# Patient Record
Sex: Male | Born: 1950
Health system: Southern US, Community
[De-identification: ages and names within clinical notes are randomized; demographics above are authoritative.]

## PROBLEM LIST (undated history)

## (undated) DIAGNOSIS — I10 Essential (primary) hypertension: Secondary | ICD-10-CM

## (undated) DIAGNOSIS — R42 Dizziness and giddiness: Secondary | ICD-10-CM

## (undated) DIAGNOSIS — M1990 Unspecified osteoarthritis, unspecified site: Secondary | ICD-10-CM

## (undated) DIAGNOSIS — N200 Calculus of kidney: Secondary | ICD-10-CM

## (undated) DIAGNOSIS — I2699 Other pulmonary embolism without acute cor pulmonale: Secondary | ICD-10-CM

## (undated) DIAGNOSIS — J449 Chronic obstructive pulmonary disease, unspecified: Secondary | ICD-10-CM

## (undated) DIAGNOSIS — IMO0001 Reserved for inherently not codable concepts without codable children: Secondary | ICD-10-CM

## (undated) HISTORY — DX: Other pulmonary embolism without acute cor pulmonale: I26.99

## (undated) HISTORY — PX: COLONOSCOPY: SHX174

## (undated) HISTORY — PX: CARDIAC CATHETERIZATION: SHX172

## (undated) HISTORY — PX: OTHER SURGICAL HISTORY: SHX169

---

## 1998-01-13 ENCOUNTER — Ambulatory Visit (HOSPITAL_COMMUNITY): Admission: RE | Admit: 1998-01-13 | Discharge: 1998-01-13 | Payer: Self-pay | Admitting: *Deleted

## 1999-05-06 ENCOUNTER — Ambulatory Visit (HOSPITAL_COMMUNITY): Admission: RE | Admit: 1999-05-06 | Discharge: 1999-05-06 | Payer: Self-pay | Admitting: *Deleted

## 2000-09-22 ENCOUNTER — Emergency Department (HOSPITAL_COMMUNITY): Admission: EM | Admit: 2000-09-22 | Discharge: 2000-09-22 | Payer: Self-pay

## 2001-02-04 ENCOUNTER — Encounter: Payer: Self-pay | Admitting: Internal Medicine

## 2001-02-04 ENCOUNTER — Encounter: Admission: RE | Admit: 2001-02-04 | Discharge: 2001-02-04 | Payer: Self-pay | Admitting: Internal Medicine

## 2004-01-08 ENCOUNTER — Encounter (INDEPENDENT_AMBULATORY_CARE_PROVIDER_SITE_OTHER): Payer: Self-pay | Admitting: Specialist

## 2004-01-08 ENCOUNTER — Ambulatory Visit (HOSPITAL_COMMUNITY): Admission: RE | Admit: 2004-01-08 | Discharge: 2004-01-08 | Payer: Self-pay | Admitting: Gastroenterology

## 2008-01-17 ENCOUNTER — Encounter: Admission: RE | Admit: 2008-01-17 | Discharge: 2008-01-17 | Payer: Self-pay | Admitting: Internal Medicine

## 2008-03-20 ENCOUNTER — Encounter: Admission: RE | Admit: 2008-03-20 | Discharge: 2008-03-20 | Payer: Self-pay | Admitting: Internal Medicine

## 2008-04-03 ENCOUNTER — Encounter: Admission: RE | Admit: 2008-04-03 | Discharge: 2008-04-03 | Payer: Self-pay | Admitting: Orthopedic Surgery

## 2008-04-08 ENCOUNTER — Encounter: Admission: RE | Admit: 2008-04-08 | Discharge: 2008-04-08 | Payer: Self-pay | Admitting: Orthopedic Surgery

## 2008-04-22 ENCOUNTER — Encounter: Admission: RE | Admit: 2008-04-22 | Discharge: 2008-04-22 | Payer: Self-pay | Admitting: Orthopedic Surgery

## 2008-07-19 ENCOUNTER — Encounter: Admission: RE | Admit: 2008-07-19 | Discharge: 2008-07-19 | Payer: Self-pay | Admitting: Orthopedic Surgery

## 2008-10-15 ENCOUNTER — Encounter: Admission: RE | Admit: 2008-10-15 | Discharge: 2008-10-15 | Payer: Self-pay | Admitting: Internal Medicine

## 2008-12-26 ENCOUNTER — Encounter: Admission: RE | Admit: 2008-12-26 | Discharge: 2008-12-26 | Payer: Self-pay | Admitting: Internal Medicine

## 2009-02-04 ENCOUNTER — Encounter: Admission: RE | Admit: 2009-02-04 | Discharge: 2009-02-04 | Payer: Self-pay | Admitting: Orthopedic Surgery

## 2009-02-04 ENCOUNTER — Emergency Department (HOSPITAL_COMMUNITY): Admission: EM | Admit: 2009-02-04 | Discharge: 2009-02-05 | Payer: Self-pay | Admitting: Emergency Medicine

## 2009-02-10 ENCOUNTER — Ambulatory Visit (HOSPITAL_BASED_OUTPATIENT_CLINIC_OR_DEPARTMENT_OTHER): Admission: RE | Admit: 2009-02-10 | Discharge: 2009-02-10 | Payer: Self-pay | Admitting: Urology

## 2009-05-23 ENCOUNTER — Ambulatory Visit (HOSPITAL_COMMUNITY): Admission: RE | Admit: 2009-05-23 | Discharge: 2009-05-23 | Payer: Self-pay | Admitting: Cardiology

## 2010-08-21 LAB — URINALYSIS, ROUTINE W REFLEX MICROSCOPIC
Glucose, UA: NEGATIVE mg/dL
Ketones, ur: NEGATIVE mg/dL
Nitrite: NEGATIVE
Protein, ur: NEGATIVE mg/dL
Urobilinogen, UA: 0.2 mg/dL (ref 0.0–1.0)

## 2010-08-21 LAB — POCT I-STAT, CHEM 8
BUN: 27 mg/dL — ABNORMAL HIGH (ref 6–23)
Calcium, Ion: 1.13 mmol/L (ref 1.12–1.32)
Chloride: 102 mEq/L (ref 96–112)
Glucose, Bld: 126 mg/dL — ABNORMAL HIGH (ref 70–99)
Potassium: 4.1 mEq/L (ref 3.5–5.1)

## 2010-08-21 LAB — DIFFERENTIAL
Basophils Absolute: 0 10*3/uL (ref 0.0–0.1)
Basophils Relative: 0 % (ref 0–1)
Eosinophils Absolute: 0 10*3/uL (ref 0.0–0.7)
Eosinophils Relative: 0 % (ref 0–5)
Monocytes Relative: 6 % (ref 3–12)
Neutro Abs: 12.3 10*3/uL — ABNORMAL HIGH (ref 1.7–7.7)

## 2010-08-21 LAB — CBC
HCT: 51.1 % (ref 39.0–52.0)
Hemoglobin: 17.4 g/dL — ABNORMAL HIGH (ref 13.0–17.0)
RBC: 5.26 MIL/uL (ref 4.22–5.81)

## 2010-08-21 LAB — POCT I-STAT 4, (NA,K, GLUC, HGB,HCT)
Glucose, Bld: 97 mg/dL (ref 70–99)
HCT: 53 % — ABNORMAL HIGH (ref 39.0–52.0)

## 2010-10-02 NOTE — Op Note (Signed)
NAME:  Devin Castro, Devin Castro                           ACCOUNT NO.:  [id number]   MEDICAL RECORD NO.:  [id number]                   PATIENT TYPE:  AMB   LOCATION:  ENDO                                 FACILITY:  [facility]   PHYSICIAN:  Bernette Redbird, M.D.                DATE OF BIRTH:  [birth year]   DATE OF PROCEDURE:  01/08/2004  DATE OF DISCHARGE:                                 OPERATIVE REPORT   PROCEDURE:  Colonoscopy with biopsy.   INDICATIONS FOR PROCEDURE:  Screening for colon cancer in a 61 year old  without worrisome risk factors or symptoms.   FINDINGS:  Diminutive sigmoid polyp.  Moderate sigmoid diverticulosis.   PROCEDURE:  The nature, purpose, and risks of the procedure have been  discussed with the patient who provided written consent.  Sedation was  Fentanyl 70 mcg and Versed 7 mg IV without arrhythmias or desaturations.  Digital exam of the prostate was unremarkable.  The Olympus Stealth video  colonoscope was advanced easily to the cecum, using some external abdominal  compression to enter the base of the cecum and pull back was then performed.  The cecum was identified by clear visualization of the appendiceal orifice.  The only abnormality on the exam was a questionable 2-3 mm sessile polyp in  the sigmoid region removed by a single cold biopsy plus moderate sigmoid  diverticulosis.  The quality of the prep was excellent and it was felt that  all areas were well seen.  No large polyps, cancer, colitis, or vascular  malformations were observed.  Retroflexion of the rectum was unremarkable.  The patient tolerated the procedure well and there were no apparent  complications.   IMPRESSION:  Polyp removed as described above.   PLAN:  Await pathology results.                                               Bernette Redbird, M.D.    RB/MEDQ  D:  01/08/2004  T:  01/08/2004  Job:  [id number]   cc:   Olene Craven, M.D.  [address]  [address]  [place]  [place] [address]  Fax: [phone]

## 2011-08-16 ENCOUNTER — Encounter (HOSPITAL_COMMUNITY): Payer: Self-pay | Admitting: *Deleted

## 2011-08-16 ENCOUNTER — Emergency Department (HOSPITAL_COMMUNITY)
Admission: EM | Admit: 2011-08-16 | Discharge: 2011-08-16 | Disposition: A | Discharge status: Home / Self Care | Payer: Medicare Other | Attending: Emergency Medicine | Admitting: Emergency Medicine

## 2011-08-16 DIAGNOSIS — I10 Essential (primary) hypertension: Secondary | ICD-10-CM | POA: Insufficient documentation

## 2011-08-16 DIAGNOSIS — Z79899 Other long term (current) drug therapy: Secondary | ICD-10-CM | POA: Insufficient documentation

## 2011-08-16 DIAGNOSIS — J4489 Other specified chronic obstructive pulmonary disease: Secondary | ICD-10-CM | POA: Insufficient documentation

## 2011-08-16 DIAGNOSIS — R040 Epistaxis: Secondary | ICD-10-CM

## 2011-08-16 DIAGNOSIS — J449 Chronic obstructive pulmonary disease, unspecified: Secondary | ICD-10-CM | POA: Insufficient documentation

## 2011-08-16 HISTORY — DX: Essential (primary) hypertension: I10

## 2011-08-16 HISTORY — DX: Dizziness and giddiness: R42

## 2011-08-16 HISTORY — DX: Chronic obstructive pulmonary disease, unspecified: J44.9

## 2011-08-16 LAB — DIFFERENTIAL
Basophils Relative: 0 % (ref 0–1)
Lymphocytes Relative: 23 % (ref 12–46)
Lymphs Abs: 2.4 10*3/uL (ref 0.7–4.0)
Monocytes Absolute: 1.1 10*3/uL — ABNORMAL HIGH (ref 0.1–1.0)
Monocytes Relative: 10 % (ref 3–12)
Neutro Abs: 6.5 10*3/uL (ref 1.7–7.7)
Neutrophils Relative %: 62 % (ref 43–77)

## 2011-08-16 LAB — COMPREHENSIVE METABOLIC PANEL
ALT: 22 U/L (ref 0–53)
AST: 16 U/L (ref 0–37)
Albumin: 3.9 g/dL (ref 3.5–5.2)
Alkaline Phosphatase: 59 U/L (ref 39–117)
BUN: 15 mg/dL (ref 6–23)
CO2: 21 mEq/L (ref 19–32)
Chloride: 106 mEq/L (ref 96–112)
GFR calc Af Amer: >90 mL/min (ref >90)
GFR calc non Af Amer: >90 mL/min (ref >90)
Sodium: 140 mEq/L (ref 135–145)
Total Bilirubin: 0.6 mg/dL (ref 0.3–1.2)

## 2011-08-16 LAB — CBC
MCV: 95.7 fL (ref 78.0–100.0)
Platelets: 246 10*3/uL (ref 150–400)
RDW: 14 % (ref 11.5–15.5)

## 2011-08-16 LAB — PROTIME-INR: Prothrombin Time: 13.2 seconds (ref 11.6–15.2)

## 2011-08-16 MED ORDER — OXYMETAZOLINE HCL 0.05 % NA SOLN
1.0000 | Freq: Once | NASAL | Status: AC
  Administered 2011-08-16: 1 via NASAL

## 2011-08-16 MED ORDER — OXYMETAZOLINE HCL 0.05 % NA SOLN
NASAL | Status: AC
  Administered 2011-08-16: 1 via NASAL
  Filled 2011-08-16: qty 15

## 2011-08-16 NOTE — ED Provider Notes (Addendum)
History     CSN: [id number]  Arrival date & time 08/16/11  7829   First MD Initiated Contact with Patient 08/16/11 [phone]      Chief Complaint  Patient presents with  . Epistaxis    (Consider location/radiation/quality/duration/timing/severity/associated sxs/prior treatment) Patient is a 61 y.o. male presenting with nosebleeds. The history is provided by the patient.  Epistaxis   He woke up at about 2 AM with blood draining into the back of his throat. He thinks it was coming from the left side of his nose. Bleeding eventually was controlled at home before he came into the emergency department. He denies any trauma to his nose. He is being treated for hypertension and had missed taking his medications for 3 days. He states that he was out of town for one day and then when he got back he got back to late to take his medications that day. He denies prior problems with nosebleeds. He is not on aspirin or any other antiplatelet or anticoagulant medication.  Past Medical History  Diagnosis Date  . Hypertension   . COPD (chronic obstructive pulmonary disease)   . Vertigo     History reviewed. No pertinent past surgical history.  History reviewed. No pertinent family history.  History  Substance Use Topics  . Smoking status: Former Smoker    Quit date: 08/16/2002  . Smokeless tobacco: Not on file  . Alcohol Use: No      Review of Systems  HENT: Positive for nosebleeds.   All other systems reviewed and are negative.    Allergies  Review of patient's allergies indicates no known allergies.  Home Medications   Current Outpatient Rx  Name Route Sig Dispense Refill  . AMLODIPINE BESYLATE 5 MG PO TABS Oral Take 5 mg by mouth daily.    . ATORVASTATIN CALCIUM 20 MG PO TABS Oral Take 20 mg by mouth daily.    Marland Kitchen LOSARTAN POTASSIUM 100 MG PO TABS Oral Take 100 mg by mouth daily.    Marland Kitchen MECLIZINE HCL 25 MG PO TABS Oral Take 25 mg by mouth 4 (four) times daily as needed.    .  NEBIVOLOL HCL 10 MG PO TABS Oral Take 10 mg by mouth daily.      BP 162/107  Pulse 86  Temp(Src) 98.6 F (37 C) (Oral)  Resp 20  SpO2 97%  Physical Exam  Nursing note and vitals reviewed.  61 year old male who is resting comfortably and in no acute distress. Vital signs are significant for hypertension with blood pressure 162/107. Oxygen saturation is 97% which is normal. Head is normocephalic and atraumatic. PERRLA, EOMI. There is no obvious bleeding site seen in the anterior nose. Some blood is seen in the posterior pharynx. Neck is nontender and supple. Lungs are clear without rales, wheezes, rhonchi. Heart has regular rate and rhythm without murmur. Abdomen soft, flat, nontender without masses or hepatosplenomegaly. Extremities have no cyanosis or edema, full range of motion is present. Skin is warm and dry without rash. Neurologic: Mental status is normal except he is somewhat anxious. Cranial nerves are intact. There no focal motor or sensory deficits.  ED Course  EPISTAXIS MANAGEMENT Date/Time: 08/16/2011 7:25 AM Performed by: Dione Booze Authorized by: Preston Fleeting, Kylon Philbrook Consent: Verbal consent obtained. Written consent not obtained. Risks and benefits: risks, benefits and alternatives were discussed Consent given by: patient Patient understanding: patient states understanding of the procedure being performed Patient consent: the patient's understanding of the procedure matches consent given Procedure consent:  procedure consent matches procedure scheduled Relevant documents: relevant documents present and verified Site marked: the operative site was marked Required items: required blood products, implants, devices, and special equipment available Patient identity confirmed: verbally with patient and arm band Time out: Immediately prior to procedure a "time out" was called to verify the correct patient, procedure, equipment, support staff and site/side marked as required. Patient  sedated: no Treatment site: left anterior Repair method: Rapid Rhino 7.5 cm. Post-procedure assessment: bleeding stopped Treatment complexity: simple Recurrence: recurrence of recent bleed Patient tolerance: Patient tolerated the procedure well with no immediate complications.   (including critical care time)  He was observed in the emergency department with no recurrence of bleeding. Options were discussed with the patient including empiric placement of an nasal pack with a Rapid Rhino. It was elected to send him home with no interventions and he is referred to Dr. Cloria Spring of [facility] for followup.  1. Left-sided epistaxis       MDM  Epistaxis which appears to be posterior. Bleeding has stopped now. He will be observed in the emergency department and if bleeding recurs will be treated with a Rapid Rhino.        Dione Booze, MD 08/16/11 [phone]  As the patient was leaving, he had recurrence of bleeding from the left side of his nose. This time, on examination, I was able to identify a bleeding site from the left side of the nasal septum. The nose was packed with a 7.5 cm Rapid Rhino, and he will be observed in the emergency department.  He was observed for 30 minutes following placement of Rapid Rhino with no recurrence of bleeding and he will be discharged.  Dione Booze, MD 08/16/11 [phone]

## 2011-08-16 NOTE — ED Provider Notes (Addendum)
History     CSN: [id number]  Arrival date & time 08/16/11  1825   First MD Initiated Contact with Patient 08/16/11 2054      Chief Complaint  Patient presents with  . Epistaxis    (Consider location/radiation/quality/duration/timing/severity/associated sxs/prior treatment) HPI CC epistaxis left nare onset early this am, was seen at [facility] long and had packing placed.  Bleeding from opposite nare started around 630pm today.  This bleeding was mild, no aggravating or alleviating factors.  No injuries.  Past Medical History  Diagnosis Date  . Hypertension   . COPD (chronic obstructive pulmonary disease)   . Vertigo     History reviewed. No pertinent past surgical history.  No family history on file.  History  Substance Use Topics  . Smoking status: Former Smoker    Quit date: 08/16/2002  . Smokeless tobacco: Not on file  . Alcohol Use: No      Review of Systems  HENT: Positive for nosebleeds.   Gastrointestinal: Negative for blood in stool and anal bleeding.  Genitourinary: Negative for hematuria.  Skin: Negative for rash.  All other systems reviewed and are negative.    Allergies  Review of patient's allergies indicates no known allergies.  Home Medications   Current Outpatient Rx  Name Route Sig Dispense Refill  . AMLODIPINE BESYLATE 5 MG PO TABS Oral Take 5 mg by mouth daily.    . ATORVASTATIN CALCIUM 20 MG PO TABS Oral Take 20 mg by mouth daily.    Marland Kitchen LOSARTAN POTASSIUM 100 MG PO TABS Oral Take 100 mg by mouth daily.    Marland Kitchen MECLIZINE HCL 25 MG PO TABS Oral Take 25 mg by mouth 4 (four) times daily as needed.    . NEBIVOLOL HCL 10 MG PO TABS Oral Take 10 mg by mouth daily.      BP 142/100  Pulse 73  Temp(Src) 98.3 F (36.8 C) (Oral)  Resp 20  SpO2 96%ra wnl  Physical Exam  Nursing note and vitals reviewed. Constitutional: He appears well-developed and well-nourished.  HENT:  Head: Normocephalic and atraumatic.  Nose: Epistaxis (mild oozing of  superficial vessels ant left nasal septum) is observed.  No foreign bodies.  Eyes: Right eye exhibits no discharge. Left eye exhibits no discharge.  Neck: Normal range of motion. Neck supple.  Cardiovascular: Normal rate, regular rhythm and normal heart sounds.   Pulmonary/Chest: Effort normal and breath sounds normal.  Abdominal: Soft. There is no tenderness.  Musculoskeletal: Normal range of motion. He exhibits no tenderness.  Neurological: He is alert.  Skin: Skin is warm and dry.  Psychiatric: He has a normal mood and affect. His behavior is normal.    ED Course  EPISTAXIS MANAGEMENT Performed by: Elijio Miles Authorized by: Glynn Octave Consent: Verbal consent obtained. Patient sedated: no Treatment site: left anterior Repair method: silver nitrate Post-procedure assessment: bleeding stopped Treatment complexity: simple Patient tolerance: Patient tolerated the procedure well with no immediate complications.   (including critical care time)  Labs Reviewed  DIFFERENTIAL - Abnormal; Notable for the following:    Monocytes Absolute 1.1 (*)    All other components within normal limits  CBC  COMPREHENSIVE METABOLIC PANEL  PROTIME-INR  APTT   No results found.   1. Epistaxis       MDM  Pt is in nad, afvss, nontoxic appearing, exam and hx as above.  Pt has friable ant nasal septum bilat at kiesselbachs plexus, some oozing from the left side.  Nasal pack removed.  Afrin spray placed.  Dr Manus Gunning used some silver nitrate on the left side.    Recheck, pt hemostatic, no blood in oropharynx, hds, will d/c        Elijio Miles, MD 08/16/11 1610  Elijio Miles, MD 08/18/11 [phone]

## 2011-08-16 NOTE — Discharge Instructions (Signed)
Nosebleed Nosebleeds can be caused by many conditions including trauma, infections, polyps, foreign bodies, dry mucous membranes or climate, medications and air conditioning. Most nosebleeds occur in the front of the nose. It is because of this location that most nosebleeds can be controlled by pinching the nostrils gently and continuously. Do this for at least 10 to 20 minutes. The reason for this long continuous pressure is that you must hold it long enough for the blood to clot. If during that 10 to 20 minute time period, pressure is released, the process may have to be started again. The nosebleed may stop by itself, quit with pressure, need concentrated heating (cautery) or stop with pressure from packing. HOME CARE INSTRUCTIONS   If your nose was packed, try to maintain the pack inside until your caregiver removes it. If a gauze pack was used and it starts to fall out, gently replace or cut the end off. Do not cut if a balloon catheter was used to pack the nose. Otherwise, do not remove unless instructed.   Avoid blowing your nose for 12 hours after treatment. This could dislodge the pack or clot and start bleeding again.   If the bleeding starts again, sit up and bending forward, gently pinch the front half of your nose continuously for 20 minutes.   If bleeding was caused by dry mucous membranes, cover the inside of your nose every morning with a petroleum or antibiotic ointment. Use your little fingertip as an applicator. Do this as needed during dry weather. This will keep the mucous membranes moist and allow them to heal.   Maintain humidity in your home by using less air conditioning or using a humidifier.   Do not use aspirin or medications which make bleeding more likely. Your caregiver can give you recommendations on this.   Resume normal activities as able but try to avoid straining, lifting or bending at the waist for several days.   If the nosebleeds become recurrent and the cause  is unknown, your caregiver may suggest laboratory tests.  SEEK IMMEDIATE MEDICAL CARE IF:   Bleeding recurs and cannot be controlled.   There is unusual bleeding from or bruising on other parts of the body.   You have a fever.   Nosebleeds continue.   There is any worsening of the condition which originally brought you in.   You become lightheaded, feel faint, become sweaty or vomit blood.  MAKE SURE YOU:   Understand these instructions.   Will watch your condition.   Will get help right away if you are not doing well or get worse.  Document Released: 02/10/2005 Document Revised: 04/22/2011 Document Reviewed: 04/04/2009 [facility] Patient Information 2012 [place], [place].  IF YOUR NOSEBLEED RETURNS, SPRAY AFRIN IN THE NOSTRIL AND HOLD PRESSURE FOR 20-30 MINUTES.  IF IT IS STILL BLEEDING, BLOW EVERYTHING OUT OF YOUR NOSE AND REPEAT THE AFRIN SPRAY AND PRESSURE HOLD.  IF IT PERSISTS AFTER THIS CALL YOUR DOCTOR OR RETURN TO THE EMERGENCY DEPT.  Return for any new or worsening symptoms or any other concerns.

## 2011-08-16 NOTE — ED Notes (Signed)
MD at bedside. Removed packing and administered Afrin nasal spray. No additional packing placed at this time.

## 2011-08-16 NOTE — ED Notes (Signed)
The pt has had a nosebleed early this am and his nose was packed at [facility] long ed.  The bleeding started again one hour ago.  He is not on any blood thinners

## 2011-08-16 NOTE — Discharge Instructions (Signed)
Rapid Rhino needs to be removed in 2 days. Either return to the emergency department or see your doctor in 2 days to have it removed.  Nosebleed Nosebleeds can be caused by many conditions including trauma, infections, polyps, foreign bodies, dry mucous membranes or climate, medications and air conditioning. Most nosebleeds occur in the front of the nose. It is because of this location that most nosebleeds can be controlled by pinching the nostrils gently and continuously. Do this for at least 10 to 20 minutes. The reason for this long continuous pressure is that you must hold it long enough for the blood to clot. If during that 10 to 20 minute time period, pressure is released, the process may have to be started again. The nosebleed may stop by itself, quit with pressure, need concentrated heating (cautery) or stop with pressure from packing. HOME CARE INSTRUCTIONS   If your nose was packed, try to maintain the pack inside until your caregiver removes it. If a gauze pack was used and it starts to fall out, gently replace or cut the end off. Do not cut if a balloon catheter was used to pack the nose. Otherwise, do not remove unless instructed.   Avoid blowing your nose for 12 hours after treatment. This could dislodge the pack or clot and start bleeding again.   If the bleeding starts again, sit up and bending forward, gently pinch the front half of your nose continuously for 20 minutes.   If bleeding was caused by dry mucous membranes, cover the inside of your nose every morning with a petroleum or antibiotic ointment. Use your little fingertip as an applicator. Do this as needed during dry weather. This will keep the mucous membranes moist and allow them to heal.   Maintain humidity in your home by using less air conditioning or using a humidifier.   Do not use aspirin or medications which make bleeding more likely. Your caregiver can give you recommendations on this.   Resume normal activities  as able but try to avoid straining, lifting or bending at the waist for several days.   If the nosebleeds become recurrent and the cause is unknown, your caregiver may suggest laboratory tests.  SEEK IMMEDIATE MEDICAL CARE IF:   Bleeding recurs and cannot be controlled.   There is unusual bleeding from or bruising on other parts of the body.   You have a fever.   Nosebleeds continue.   There is any worsening of the condition which originally brought you in.   You become lightheaded, feel faint, become sweaty or vomit blood.  MAKE SURE YOU:   Understand these instructions.   Will watch your condition.   Will get help right away if you are not doing well or get worse.  Document Released: 02/10/2005 Document Revised: 04/22/2011 Document Reviewed: 04/04/2009 [facility] Patient Information 2012 [place], [place].

## 2011-08-16 NOTE — ED Notes (Signed)
MD at bedside. 

## 2011-08-16 NOTE — ED Notes (Signed)
Pt c/o nose bleed x 3 hrs. Pt presents w/ hypertension, has not taken HTN meds x 2 days. Bleeding has slowed but is not controlled.

## 2011-08-16 NOTE — ED Notes (Signed)
Pt escorted window and given new d/c instructions

## 2011-08-17 NOTE — ED Provider Notes (Signed)
I saw and evaluated the patient, reviewed the resident's note and I agree with the findings and plan.  Seen last night for left epistaxis and given rhinorocket.  Began bleeding around it this evening.   Packing removed.  No active bleeding.  Cauterized some area of oozing on left septum.   Glynn Octave, MD 08/17/11 0002

## 2011-08-19 NOTE — ED Provider Notes (Signed)
I saw and evaluated the patient, reviewed the resident's note and I agree with the findings and plan.   Glynn Octave, MD 08/19/11 1044

## 2011-10-23 ENCOUNTER — Encounter (HOSPITAL_COMMUNITY): Payer: Self-pay | Admitting: *Deleted

## 2011-10-23 ENCOUNTER — Emergency Department (HOSPITAL_COMMUNITY)
Admission: EM | Admit: 2011-10-23 | Discharge: 2011-10-23 | Disposition: A | Discharge status: Home / Self Care | Payer: Medicare Other | Attending: Emergency Medicine | Admitting: Emergency Medicine

## 2011-10-23 ENCOUNTER — Emergency Department (HOSPITAL_COMMUNITY): Payer: Medicare Other

## 2011-10-23 DIAGNOSIS — J4489 Other specified chronic obstructive pulmonary disease: Secondary | ICD-10-CM | POA: Insufficient documentation

## 2011-10-23 DIAGNOSIS — J449 Chronic obstructive pulmonary disease, unspecified: Secondary | ICD-10-CM | POA: Insufficient documentation

## 2011-10-23 DIAGNOSIS — N200 Calculus of kidney: Secondary | ICD-10-CM | POA: Insufficient documentation

## 2011-10-23 DIAGNOSIS — I10 Essential (primary) hypertension: Secondary | ICD-10-CM | POA: Insufficient documentation

## 2011-10-23 LAB — CBC
Hemoglobin: 15.4 g/dL (ref 13.0–17.0)
MCHC: 34.1 g/dL (ref 30.0–36.0)
RDW: 13.3 % (ref 11.5–15.5)
WBC: 12.7 10*3/uL — ABNORMAL HIGH (ref 4.0–10.5)

## 2011-10-23 LAB — COMPREHENSIVE METABOLIC PANEL
AST: 18 U/L (ref 0–37)
Albumin: 3.9 g/dL (ref 3.5–5.2)
Alkaline Phosphatase: 54 U/L (ref 39–117)
Chloride: 107 mEq/L (ref 96–112)
Potassium: 4.1 mEq/L (ref 3.5–5.1)
Total Bilirubin: 0.4 mg/dL (ref 0.3–1.2)

## 2011-10-23 LAB — DIFFERENTIAL
Basophils Absolute: 0.1 10*3/uL (ref 0.0–0.1)
Basophils Relative: 0 % (ref 0–1)
Lymphocytes Relative: 21 % (ref 12–46)
Monocytes Relative: 10 % (ref 3–12)
Neutro Abs: 8.5 10*3/uL — ABNORMAL HIGH (ref 1.7–7.7)
Neutrophils Relative %: 67 % (ref 43–77)

## 2011-10-23 MED ORDER — ONDANSETRON HCL 4 MG/2ML IJ SOLN
4.0000 mg | Freq: Once | INTRAMUSCULAR | Status: AC
  Administered 2011-10-23: 4 mg via INTRAVENOUS
  Filled 2011-10-23: qty 2

## 2011-10-23 MED ORDER — HYDROMORPHONE HCL PF 1 MG/ML IJ SOLN
1.0000 mg | Freq: Once | INTRAMUSCULAR | Status: AC
  Administered 2011-10-23: 1 mg via INTRAVENOUS
  Filled 2011-10-23: qty 1

## 2011-10-23 MED ORDER — HYDROCODONE-ACETAMINOPHEN 5-325 MG PO TABS: 1.0000 | ORAL_TABLET | Freq: Three times a day (TID) | ORAL | Status: AC | PRN

## 2011-10-23 MED ORDER — FENTANYL CITRATE 0.05 MG/ML IJ SOLN
50.0000 ug | Freq: Once | INTRAMUSCULAR | Status: AC
  Administered 2011-10-23: 50 ug via INTRAVENOUS
  Filled 2011-10-23: qty 2

## 2011-10-23 MED ORDER — ONDANSETRON HCL 4 MG PO TABS: 4.0000 mg | ORAL_TABLET | Freq: Three times a day (TID) | ORAL | Status: AC | PRN

## 2011-10-23 MED ORDER — SODIUM CHLORIDE 0.9 % IV BOLUS (SEPSIS)
1000.0000 mL | Freq: Once | INTRAVENOUS | Status: AC
  Administered 2011-10-23: 1000 mL via INTRAVENOUS

## 2011-10-23 MED ORDER — KETOROLAC TROMETHAMINE 30 MG/ML IJ SOLN
30.0000 mg | Freq: Once | INTRAMUSCULAR | Status: AC
  Administered 2011-10-23: 30 mg via INTRAVENOUS
  Filled 2011-10-23: qty 1

## 2011-10-23 MED ORDER — CIPROFLOXACIN IN D5W 400 MG/200ML IV SOLN
400.0000 mg | Freq: Once | INTRAVENOUS | Status: AC
  Administered 2011-10-23: 400 mg via INTRAVENOUS
  Filled 2011-10-23: qty 200

## 2011-10-23 MED ORDER — CIPROFLOXACIN HCL 500 MG PO TABS: 500.0000 mg | ORAL_TABLET | Freq: Two times a day (BID) | ORAL | Status: AC

## 2011-10-23 NOTE — ED Provider Notes (Signed)
History     CSN: [id number]  Arrival date & time 10/23/11  4098   First MD Initiated Contact with Patient 10/23/11 0703      Chief Complaint  Patient presents with  . Flank Pain    "kidney stone"   started at 2 am    HPI The patient presents with left flank pain.  The pain awoke him from sleep approximately 5 hours prior to arrival.  History of the pain is sharp, crampy, intermittent, though more present than not.  The pain is similar to that he is experienced on prior occasions, associated with kidney stones.  He notes concurrent nausea, no vomiting, no diarrhea, no fevers, chills.  No attempts at home pain relief thus far. Past Medical History  Diagnosis Date  . Hypertension   . COPD (chronic obstructive pulmonary disease)   . Vertigo     Past Surgical History  Procedure Date  . Kidney stone removal   . Cardiac catheterization     History reviewed. No pertinent family history.  History  Substance Use Topics  . Smoking status: Former Smoker -- 20 years    Quit date: 08/16/2002  . Smokeless tobacco: Not on file  . Alcohol Use: No      Review of Systems  Constitutional:       Per HPI, otherwise negative  HENT:       Per HPI, otherwise negative  Eyes: Negative.   Respiratory:       Per HPI, otherwise negative  Cardiovascular:       Per HPI, otherwise negative  Gastrointestinal: Negative for vomiting.  Genitourinary: Negative.   Musculoskeletal:       Per HPI, otherwise negative  Skin: Negative.   Neurological: Negative for syncope.    Allergies  Review of patient's allergies indicates no known allergies.  Home Medications   Current Outpatient Rx  Name Route Sig Dispense Refill  . AMLODIPINE BESYLATE 5 MG PO TABS Oral Take 5 mg by mouth daily.    . ATORVASTATIN CALCIUM 20 MG PO TABS Oral Take 20 mg by mouth daily.    Marland Kitchen LOSARTAN POTASSIUM 100 MG PO TABS Oral Take 100 mg by mouth daily.    . MOMETASONE FURO-FORMOTEROL FUM 200-5 MCG/ACT IN AERO Inhalation  Inhale 1 puff into the lungs 2 (two) times daily.    . NEBIVOLOL HCL 10 MG PO TABS Oral Take 10 mg by mouth daily.      BP 158/94  Pulse 111  Temp(Src) 98.1 F (36.7 C) (Oral)  Resp 24  SpO2 100%  Physical Exam  Nursing note and vitals reviewed. Constitutional: He is oriented to person, place, and time. He appears well-developed. No distress.  HENT:  Head: Normocephalic and atraumatic.  Eyes: Conjunctivae and EOM are normal.  Cardiovascular: Normal rate and regular rhythm.   Pulmonary/Chest: Effort normal. No stridor. No respiratory distress.  Abdominal: He exhibits no distension. There is tenderness. There is guarding and CVA tenderness. There is no rigidity and no rebound.  Musculoskeletal: He exhibits no edema.  Neurological: He is alert and oriented to person, place, and time.  Skin: Skin is warm and dry.  Psychiatric: He has a normal mood and affect.    ED Course  Procedures (including critical care time)  Labs Reviewed  CBC - Abnormal; Notable for the following:    WBC 12.7 (*)    All other components within normal limits  DIFFERENTIAL - Abnormal; Notable for the following:    Neutro Abs  8.5 (*)    Monocytes Absolute 1.2 (*)    All other components within normal limits  COMPREHENSIVE METABOLIC PANEL - Abnormal; Notable for the following:    BUN 24 (*)    GFR calc non Af Amer 71 (*)    GFR calc Af Amer 83 (*)    All other components within normal limits   No results found.   No diagnosis found.    MDM  This 61 year old male with a history of prior kidney stones now presents with acute onset of left flank pain.  On exam the patient is uncomfortable appearing, though in no distress.  The patient is afebrile, though mildly tachycardic.  The patient's evaluation demonstrates leukocytosis, mild change in renal function.  The patient's CT scan demonstrates a 4 mm obstructing stone on the left.  Given the size of the stone, the obstruction, the patient's history of  prior surgical removal of the stones, I discussed the case with our urologist on call.  Given the patient's history, he was given ciprofloxacin, urine culture was sent, and after resuscitation the patient was discharged with explicit return precautions and instructions to follow up on Monday.  Gerhard Munch, MD 10/23/11 [phone]

## 2011-10-23 NOTE — ED Notes (Signed)
Pt st's he's already had this kidney stone identified on x-ray, was told that it was 5-38mm.  Pt st's he feels like it's on the move.

## 2012-01-26 ENCOUNTER — Other Ambulatory Visit: Payer: Self-pay | Admitting: Urology

## 2012-03-20 ENCOUNTER — Ambulatory Visit: Admit: 2012-03-20 | Payer: Self-pay | Admitting: Urology

## 2012-03-20 SURGERY — LITHOTRIPSY, ESWL
Anesthesia: LOCAL | Laterality: Left

## 2012-09-16 ENCOUNTER — Encounter (HOSPITAL_BASED_OUTPATIENT_CLINIC_OR_DEPARTMENT_OTHER): Payer: Self-pay | Admitting: Emergency Medicine

## 2012-09-16 ENCOUNTER — Emergency Department (HOSPITAL_BASED_OUTPATIENT_CLINIC_OR_DEPARTMENT_OTHER)
Admission: EM | Admit: 2012-09-16 | Discharge: 2012-09-16 | Disposition: A | Discharge status: Home / Self Care | Payer: Medicare Other | Attending: Emergency Medicine | Admitting: Emergency Medicine

## 2012-09-16 DIAGNOSIS — Z23 Encounter for immunization: Secondary | ICD-10-CM | POA: Insufficient documentation

## 2012-09-16 DIAGNOSIS — H579 Unspecified disorder of eye and adnexa: Secondary | ICD-10-CM | POA: Insufficient documentation

## 2012-09-16 DIAGNOSIS — H5789 Other specified disorders of eye and adnexa: Secondary | ICD-10-CM

## 2012-09-16 DIAGNOSIS — J449 Chronic obstructive pulmonary disease, unspecified: Secondary | ICD-10-CM | POA: Insufficient documentation

## 2012-09-16 DIAGNOSIS — J4489 Other specified chronic obstructive pulmonary disease: Secondary | ICD-10-CM | POA: Insufficient documentation

## 2012-09-16 DIAGNOSIS — Z79899 Other long term (current) drug therapy: Secondary | ICD-10-CM | POA: Insufficient documentation

## 2012-09-16 DIAGNOSIS — Z87891 Personal history of nicotine dependence: Secondary | ICD-10-CM | POA: Insufficient documentation

## 2012-09-16 DIAGNOSIS — I10 Essential (primary) hypertension: Secondary | ICD-10-CM | POA: Insufficient documentation

## 2012-09-16 MED ORDER — FLUORESCEIN SODIUM 1 MG OP STRP
1.0000 | ORAL_STRIP | Freq: Once | OPHTHALMIC | Status: AC
  Administered 2012-09-16: 1 via OPHTHALMIC

## 2012-09-16 MED ORDER — TETRACAINE HCL 0.5 % OP SOLN
1.0000 [drp] | Freq: Once | OPHTHALMIC | Status: AC
  Administered 2012-09-16: 2 [drp] via OPHTHALMIC

## 2012-09-16 MED ORDER — FLUORESCEIN SODIUM 1 MG OP STRP
ORAL_STRIP | OPHTHALMIC | Status: AC
  Administered 2012-09-16: 1 via OPHTHALMIC
  Filled 2012-09-16: qty 1

## 2012-09-16 MED ORDER — TETANUS-DIPHTH-ACELL PERTUSSIS 5-2.5-18.5 LF-MCG/0.5 IM SUSP
INTRAMUSCULAR | Status: AC
  Administered 2012-09-16: 0.5 mL via INTRAMUSCULAR
  Filled 2012-09-16: qty 0.5

## 2012-09-16 MED ORDER — TETANUS-DIPHTH-ACELL PERTUSSIS 5-2.5-18.5 LF-MCG/0.5 IM SUSP
0.5000 mL | Freq: Once | INTRAMUSCULAR | Status: AC
  Administered 2012-09-16: 0.5 mL via INTRAMUSCULAR

## 2012-09-16 MED ORDER — TETRACAINE HCL 0.5 % OP SOLN
OPHTHALMIC | Status: AC
  Administered 2012-09-16: 2 [drp] via OPHTHALMIC
  Filled 2012-09-16: qty 2

## 2012-09-16 MED ORDER — ERYTHROMYCIN 5 MG/GM OP OINT: TOPICAL_OINTMENT | OPHTHALMIC | Status: DC

## 2012-09-16 NOTE — ED Provider Notes (Signed)
History     CSN: [id number]  Arrival date & time 09/16/12  1426   First MD Initiated Contact with Patient 09/16/12 1431      Chief Complaint  Patient presents with  . Eye Injury    (Consider location/radiation/quality/duration/timing/severity/associated sxs/prior treatment) HPI Comments: Patient presents to the emergency department with chief complaint of left eye irritation. He states that he noticed the irritation first and this morning. States that he is rinsed his eye out several times, but is still feels irritated. He denies seeing any foreign body. He denies any mechanism of injury. He denies any blurred vision. States the symptoms are persistent. States his pain is mild. He has not taken anything to alleviate his symptoms. Nothing makes his symptoms better or worse.  No floaters, field deficits, or other eye complaints.  The history is provided by the patient. No language interpreter was used.    Past Medical History  Diagnosis Date  . Hypertension   . COPD (chronic obstructive pulmonary disease)   . Vertigo     Past Surgical History  Procedure Laterality Date  . Kidney stone removal    . Cardiac catheterization      No family history on file.  History  Substance Use Topics  . Smoking status: Former Smoker -- 20 years    Quit date: 08/16/2002  . Smokeless tobacco: Not on file  . Alcohol Use: No      Review of Systems  All other systems reviewed and are negative.    Allergies  Review of patient's allergies indicates no known allergies.  Home Medications   Current Outpatient Rx  Name  Route  Sig  Dispense  Refill  . amLODipine (NORVASC) 5 MG tablet   Oral   Take 5 mg by mouth daily.         Marland Kitchen atorvastatin (LIPITOR) 20 MG tablet   Oral   Take 20 mg by mouth daily.         Marland Kitchen losartan (COZAAR) 100 MG tablet   Oral   Take 100 mg by mouth daily.         . Mometasone Furo-Formoterol Fum 200-5 MCG/ACT AERO   Inhalation   Inhale 1 puff into the  lungs 2 (two) times daily.         . nebivolol (BYSTOLIC) 10 MG tablet   Oral   Take 10 mg by mouth daily.         Marland Kitchen erythromycin ophthalmic ointment      Place a 1/2 inch ribbon of ointment into the lower eyelid.   3.5 g   0     BP 134/90  Pulse 72  Temp(Src) 97.6 F (36.4 C) (Oral)  Resp 18  Ht 5\' 11"  (1.803 m)  Wt 220 lb (99.791 kg)  BMI 30.7 kg/m2  SpO2 98%  Physical Exam  Nursing note and vitals reviewed. Constitutional: He is oriented to person, place, and time. He appears well-developed and well-nourished.  HENT:  Head: Normocephalic and atraumatic.  Eyes: EOM are normal. Pupils are equal, round, and reactive to light. Right eye exhibits no discharge. Left eye exhibits no discharge. No scleral icterus.  No foreign bodies seen, no corneal abrasions or lacerations seen with wood's lamp examination, eye pressure is 14, no foreign bodies seen with slit-lamp, eyelid was everted, no acute changes in vision from baseline. Left conjunctiva is red and inflamed  Neck: Normal range of motion.  Cardiovascular: Normal rate.   Pulmonary/Chest: Effort normal.  Abdominal:  He exhibits no distension.  Musculoskeletal: Normal range of motion.  Neurological: He is alert and oriented to person, place, and time.  Skin: Skin is dry.  Psychiatric: He has a normal mood and affect. His behavior is normal. Judgment and thought content normal.    ED Course  Procedures (including critical care time)  Labs Reviewed - No data to display No results found.   1. Eye irritation       MDM  Patient with left eye irritation. No foreign bodies seen. No remarkable changes in vision. Exam is otherwise normal. Will discharge patient to home with ophthalmology followup. Give Romycin. Patient is stable and ready for discharge.        Roxy Horseman, PA-C 09/16/12 1520

## 2012-09-16 NOTE — ED Provider Notes (Signed)
Medical screening examination/treatment/procedure(s) were performed by non-physician practitioner and as supervising physician I was immediately available for consultation/collaboration.   Yasenia Reedy, MD 09/16/12 1532 

## 2012-09-16 NOTE — ED Notes (Signed)
Pt has pain to left eye, redness noted.  No blurry vision.  Pt has rinsed eye out but pain continues.

## 2014-05-06 ENCOUNTER — Other Ambulatory Visit (HOSPITAL_COMMUNITY): Payer: Self-pay | Admitting: Orthopedic Surgery

## 2014-05-23 NOTE — Pre-Procedure Instructions (Signed)
Devin Castro  05/23/2014   Your procedure is scheduled on:  Tues, Jan 19 @ 7:30 AM  [facility]:30 AM.  Call this number if you have problems the morning of surgery: [phone]   Remember:   Do not eat food or drink liquids after midnight.   Take these medicines the morning of surgery with A SIP OF WATER: Amlodipine(Norvasc),Mometasone<Bring Your Inhaler With You> and Bystolic(Nebivolol)               No Goody's,BC's,Aleve,Aspirin,Ibuprofen,Fish Oil,or any Herbal Medications   Do not wear jewelry  Do not wear lotions, powders, or colognes. You may wear deodorant.  Men may shave face and neck.  Do not bring valuables to the hospital.  [facility] is not responsible                  for any belongings or valuables.               Contacts, dentures or bridgework may not be worn into surgery.  Leave suitcase in the car. After surgery it may be brought to your room.  For patients admitted to the hospital, discharge time is determined by your                treatment team.               Special Instructions:   - Preparing for Surgery  Before surgery, you can play an important role.  Because skin is not sterile, your skin needs to be as free of germs as possible.  You can reduce the number of germs on you skin by washing with CHG (chlorahexidine gluconate) soap before surgery.  CHG is an antiseptic cleaner which kills germs and bonds with the skin to continue killing germs even after washing.  Please DO NOT use if you have an allergy to CHG or antibacterial soaps.  If your skin becomes reddened/irritated stop using the CHG and inform your nurse when you arrive at Short Stay.  Do not shave (including legs and underarms) for at least 48 hours prior to the first CHG shower.  You may shave your face.  Please follow these instructions carefully:   1.  Shower with CHG Soap the night before surgery and the                                morning of  Surgery.  2.  If you choose to wash your hair, wash your hair first as usual with your       normal shampoo.  3.  After you shampoo, rinse your hair and body thoroughly to remove the                      Shampoo.  4.  Use CHG as you would any other liquid soap.  You can apply chg directly       to the skin and wash gently with scrungie or a clean washcloth.  5.  Apply the CHG Soap to your body ONLY FROM THE NECK DOWN.        Do not use on open wounds or open sores.  Avoid contact with your eyes,       ears, mouth and genitals (private parts).  Wash genitals (private parts)       with your normal soap.  6.  Wash  thoroughly, paying special attention to the area where your surgery        will be performed.  7.  Thoroughly rinse your body with warm water from the neck down.  8.  DO NOT shower/wash with your normal soap after using and rinsing off       the CHG Soap.  9.  Pat yourself dry with a clean towel.            10.  Wear clean pajamas.            11.  Place clean sheets on your bed the night of your first shower and do not        sleep with pets.  Day of Surgery  Do not apply any lotions/deoderants the morning of surgery.  Please wear clean clothes to the hospital/surgery center.     Please read over the following fact sheets that you were given: Pain Booklet, Coughing and Deep Breathing, Blood Transfusion Information, MRSA Information and Surgical Site Infection Prevention

## 2014-05-24 ENCOUNTER — Other Ambulatory Visit: Payer: Self-pay

## 2014-05-24 ENCOUNTER — Encounter (HOSPITAL_COMMUNITY)
Admission: RE | Admit: 2014-05-24 | Discharge: 2014-05-24 | Disposition: A | Discharge status: Home / Self Care | Payer: Medicare Other | Source: Ambulatory Visit | Attending: Orthopedic Surgery | Admitting: Orthopedic Surgery

## 2014-05-24 ENCOUNTER — Encounter (HOSPITAL_COMMUNITY): Payer: Self-pay

## 2014-05-24 DIAGNOSIS — Z01818 Encounter for other preprocedural examination: Secondary | ICD-10-CM | POA: Diagnosis present

## 2014-05-24 DIAGNOSIS — I10 Essential (primary) hypertension: Secondary | ICD-10-CM | POA: Insufficient documentation

## 2014-05-24 DIAGNOSIS — J449 Chronic obstructive pulmonary disease, unspecified: Secondary | ICD-10-CM | POA: Diagnosis not present

## 2014-05-24 HISTORY — DX: Calculus of kidney: N20.0

## 2014-05-24 LAB — URINE MICROSCOPIC-ADD ON

## 2014-05-24 LAB — BASIC METABOLIC PANEL
Anion gap: 8 (ref 5–15)
BUN: 21 mg/dL (ref 6–23)
CHLORIDE: 105 meq/L (ref 96–112)
CO2: 24 mmol/L (ref 19–32)
Calcium: 9.2 mg/dL (ref 8.4–10.5)
Creatinine, Ser: 0.99 mg/dL (ref 0.50–1.35)
GFR calc non Af Amer: 87 mL/min — ABNORMAL LOW (ref >90)
Glucose, Bld: 96 mg/dL (ref 70–99)
Potassium: 4.1 mmol/L (ref 3.5–5.1)
Sodium: 137 mmol/L (ref 135–145)

## 2014-05-24 LAB — SURGICAL PCR SCREEN
MRSA, PCR: NEGATIVE
Staphylococcus aureus: POSITIVE — AB

## 2014-05-24 LAB — URINALYSIS, ROUTINE W REFLEX MICROSCOPIC
BILIRUBIN URINE: NEGATIVE
Glucose, UA: NEGATIVE mg/dL
Ketones, ur: NEGATIVE mg/dL
Leukocytes, UA: NEGATIVE
Nitrite: NEGATIVE
Protein, ur: NEGATIVE mg/dL
Specific Gravity, Urine: 1.021 (ref 1.005–1.030)
Urobilinogen, UA: 0.2 mg/dL (ref 0.0–1.0)
pH: 5 (ref 5.0–8.0)

## 2014-05-24 LAB — CBC
HCT: 50.3 % (ref 39.0–52.0)
Hemoglobin: 17.1 g/dL — ABNORMAL HIGH (ref 13.0–17.0)
MCH: 32.4 pg (ref 26.0–34.0)
MCHC: 34 g/dL (ref 30.0–36.0)
MCV: 95.3 fL (ref 78.0–100.0)
Platelets: 247 10*3/uL (ref 150–400)
RBC: 5.28 MIL/uL (ref 4.22–5.81)
RDW: 13.5 % (ref 11.5–15.5)
WBC: 9 10*3/uL (ref 4.0–10.5)

## 2014-05-24 LAB — TYPE AND SCREEN
ABO/RH(D): AB POS
ANTIBODY SCREEN: NEGATIVE

## 2014-05-24 LAB — APTT: APTT: 33 s (ref 24–37)

## 2014-05-24 LAB — PROTIME-INR
INR: 1.04 (ref 0.00–1.49)
Prothrombin Time: 13.7 seconds (ref 11.6–15.2)

## 2014-05-24 LAB — ABO/RH: ABO/RH(D): AB POS

## 2014-05-24 NOTE — Progress Notes (Signed)
Mr Suzie Portelaayne called and informed of positive MSSA results. Script called into [facility], for Mupirocin.

## 2014-05-24 NOTE — Progress Notes (Signed)
   05/24/14 0819  OBSTRUCTIVE SLEEP APNEA  Have you ever been diagnosed with sleep apnea through a sleep study? No  Do you snore loudly (loud enough to be heard through closed doors)?  1  Do you often feel tired, fatigued, or sleepy during the daytime? 0  Has anyone observed you stop breathing during your sleep? 0  Do you have, or are you being treated for high blood pressure? 1  BMI more than 35 kg/m2? 0  Age over 62 years old? 1  Neck circumference greater than 40 cm/16 inches? 0  Gender: 1  Obstructive Sleep Apnea Score 4  Score 4 or greater  Results sent to PCP

## 2014-05-24 NOTE — Progress Notes (Signed)
PCP is Dr Antony HasteMichael Castro Pt states he saw a cardiologist years ago and had a cath done. Heart cath noted in epic from 2011. States he had a stress test many years ago. Denies having a recent EKG or CXR.

## 2014-05-25 LAB — URINE CULTURE
Colony Count: NO GROWTH
Culture: NO GROWTH

## 2014-06-03 MED ORDER — CEFAZOLIN SODIUM-DEXTROSE 2-3 GM-% IV SOLR
2.0000 g | INTRAVENOUS | Status: AC
  Administered 2014-06-04 (×2): 2 g via INTRAVENOUS
  Filled 2014-06-03: qty 50

## 2014-06-04 ENCOUNTER — Inpatient Hospital Stay (HOSPITAL_COMMUNITY): Payer: Medicare Other | Admitting: Anesthesiology

## 2014-06-04 ENCOUNTER — Encounter (HOSPITAL_COMMUNITY)
Admission: RE | Disposition: A | Discharge status: Home Health | Payer: Self-pay | Source: Ambulatory Visit | Attending: Orthopedic Surgery

## 2014-06-04 ENCOUNTER — Inpatient Hospital Stay (HOSPITAL_COMMUNITY)
Admission: RE | Admit: 2014-06-04 | Discharge: 2014-06-09 | DRG: 462 | Disposition: A | Discharge status: Home Health | Payer: Medicare Other | Source: Ambulatory Visit | Attending: Orthopedic Surgery | Admitting: Orthopedic Surgery

## 2014-06-04 ENCOUNTER — Encounter (HOSPITAL_COMMUNITY): Payer: Self-pay | Admitting: *Deleted

## 2014-06-04 DIAGNOSIS — J449 Chronic obstructive pulmonary disease, unspecified: Secondary | ICD-10-CM | POA: Diagnosis present

## 2014-06-04 DIAGNOSIS — Z87891 Personal history of nicotine dependence: Secondary | ICD-10-CM

## 2014-06-04 DIAGNOSIS — M25569 Pain in unspecified knee: Secondary | ICD-10-CM | POA: Diagnosis present

## 2014-06-04 DIAGNOSIS — I10 Essential (primary) hypertension: Secondary | ICD-10-CM | POA: Diagnosis present

## 2014-06-04 DIAGNOSIS — M170 Bilateral primary osteoarthritis of knee: Secondary | ICD-10-CM | POA: Diagnosis present

## 2014-06-04 DIAGNOSIS — M1710 Unilateral primary osteoarthritis, unspecified knee: Secondary | ICD-10-CM | POA: Diagnosis present

## 2014-06-04 HISTORY — DX: Reserved for inherently not codable concepts without codable children: IMO0001

## 2014-06-04 HISTORY — DX: Unspecified osteoarthritis, unspecified site: M19.90

## 2014-06-04 HISTORY — PX: TOTAL KNEE ARTHROPLASTY: SHX125

## 2014-06-04 SURGERY — ARTHROPLASTY, KNEE, BILATERAL, TOTAL
Anesthesia: General | Site: Knee | Laterality: Bilateral

## 2014-06-04 MED ORDER — METOCLOPRAMIDE HCL 5 MG/ML IJ SOLN
INTRAMUSCULAR | Status: AC
  Filled 2014-06-04: qty 2

## 2014-06-04 MED ORDER — NEBIVOLOL HCL 10 MG PO TABS
10.0000 mg | ORAL_TABLET | Freq: Every day | ORAL | Status: DC
  Administered 2014-06-04 – 2014-06-09 (×6): 10 mg via ORAL
  Filled 2014-06-04 (×6): qty 1

## 2014-06-04 MED ORDER — 0.9 % SODIUM CHLORIDE (POUR BTL) OPTIME
TOPICAL | Status: DC | PRN
  Administered 2014-06-04 (×2): 1000 mL

## 2014-06-04 MED ORDER — ONDANSETRON HCL 4 MG/2ML IJ SOLN
INTRAMUSCULAR | Status: AC
  Administered 2014-06-04: 4 mg
  Filled 2014-06-04: qty 2

## 2014-06-04 MED ORDER — POTASSIUM CHLORIDE IN NACL 20-0.9 MEQ/L-% IV SOLN
INTRAVENOUS | Status: DC
  Administered 2014-06-04 – 2014-06-06 (×2): via INTRAVENOUS
  Filled 2014-06-04 (×10): qty 1000

## 2014-06-04 MED ORDER — VECURONIUM BROMIDE 10 MG IV SOLR
INTRAVENOUS | Status: AC
  Filled 2014-06-04: qty 10

## 2014-06-04 MED ORDER — CEFAZOLIN SODIUM-DEXTROSE 2-3 GM-% IV SOLR
INTRAVENOUS | Status: AC
  Filled 2014-06-04: qty 50

## 2014-06-04 MED ORDER — FENTANYL CITRATE 0.05 MG/ML IJ SOLN
INTRAMUSCULAR | Status: AC
  Filled 2014-06-04: qty 5

## 2014-06-04 MED ORDER — ACETAMINOPHEN 10 MG/ML IV SOLN
1000.0000 mg | INTRAVENOUS | Status: DC
  Filled 2014-06-04: qty 100

## 2014-06-04 MED ORDER — VECURONIUM BROMIDE 10 MG IV SOLR
INTRAVENOUS | Status: DC | PRN
  Administered 2014-06-04: 2 mg via INTRAVENOUS
  Administered 2014-06-04: 3 mg via INTRAVENOUS

## 2014-06-04 MED ORDER — DEXAMETHASONE SODIUM PHOSPHATE 4 MG/ML IJ SOLN
INTRAMUSCULAR | Status: AC
  Filled 2014-06-04: qty 2

## 2014-06-04 MED ORDER — SODIUM CHLORIDE 0.9 % IJ SOLN
INTRAMUSCULAR | Status: AC
  Filled 2014-06-04: qty 10

## 2014-06-04 MED ORDER — DEXTROSE 5 % IV SOLN
INTRAVENOUS | Status: DC | PRN
  Administered 2014-06-04: 07:00:00 via INTRAVENOUS

## 2014-06-04 MED ORDER — MOMETASONE FURO-FORMOTEROL FUM 200-5 MCG/ACT IN AERO
1.0000 | INHALATION_SPRAY | Freq: Every day | RESPIRATORY_TRACT | Status: DC
  Administered 2014-06-05 – 2014-06-09 (×5): 1 via RESPIRATORY_TRACT
  Filled 2014-06-04: qty 8.8

## 2014-06-04 MED ORDER — DEXAMETHASONE SODIUM PHOSPHATE 4 MG/ML IJ SOLN
INTRAMUSCULAR | Status: DC | PRN
Start: 2014-06-04 — End: 2014-06-04
  Administered 2014-06-04: 8 mg via INTRAVENOUS

## 2014-06-04 MED ORDER — PROPOFOL 10 MG/ML IV BOLUS
INTRAVENOUS | Status: DC | PRN
  Administered 2014-06-04: 200 mg via INTRAVENOUS

## 2014-06-04 MED ORDER — METOCLOPRAMIDE HCL 5 MG/ML IJ SOLN
5.0000 mg | Freq: Three times a day (TID) | INTRAMUSCULAR | Status: DC | PRN
  Administered 2014-06-04 – 2014-06-05 (×2): 10 mg via INTRAVENOUS
  Filled 2014-06-04: qty 2

## 2014-06-04 MED ORDER — ARTIFICIAL TEARS OP OINT
TOPICAL_OINTMENT | OPHTHALMIC | Status: AC
  Filled 2014-06-04: qty 3.5

## 2014-06-04 MED ORDER — LOSARTAN POTASSIUM 50 MG PO TABS
100.0000 mg | ORAL_TABLET | Freq: Every day | ORAL | Status: DC
  Administered 2014-06-04 – 2014-06-09 (×6): 100 mg via ORAL
  Filled 2014-06-04 (×6): qty 2

## 2014-06-04 MED ORDER — NEOSTIGMINE METHYLSULFATE 10 MG/10ML IV SOLN
INTRAVENOUS | Status: DC | PRN
  Administered 2014-06-04: 5 mg via INTRAVENOUS

## 2014-06-04 MED ORDER — ONDANSETRON HCL 4 MG PO TABS
4.0000 mg | ORAL_TABLET | Freq: Four times a day (QID) | ORAL | Status: DC | PRN
  Administered 2014-06-07 – 2014-06-09 (×3): 4 mg via ORAL
  Filled 2014-06-04 (×3): qty 1

## 2014-06-04 MED ORDER — NEOSTIGMINE METHYLSULFATE 10 MG/10ML IV SOLN
INTRAVENOUS | Status: AC
  Filled 2014-06-04: qty 1

## 2014-06-04 MED ORDER — METOCLOPRAMIDE HCL 5 MG PO TABS
5.0000 mg | ORAL_TABLET | Freq: Three times a day (TID) | ORAL | Status: DC | PRN
  Filled 2014-06-04: qty 2

## 2014-06-04 MED ORDER — ONDANSETRON HCL 4 MG/2ML IJ SOLN
INTRAMUSCULAR | Status: AC
  Filled 2014-06-04: qty 2

## 2014-06-04 MED ORDER — SODIUM CHLORIDE 0.9 % IR SOLN
Status: DC | PRN
  Administered 2014-06-04: 3000 mL

## 2014-06-04 MED ORDER — ROCURONIUM BROMIDE 50 MG/5ML IV SOLN
INTRAVENOUS | Status: AC
  Filled 2014-06-04: qty 1

## 2014-06-04 MED ORDER — GLYCOPYRROLATE 0.2 MG/ML IJ SOLN
INTRAMUSCULAR | Status: DC | PRN
  Administered 2014-06-04: 0.6 mg via INTRAVENOUS

## 2014-06-04 MED ORDER — CEFAZOLIN SODIUM-DEXTROSE 2-3 GM-% IV SOLR
2.0000 g | Freq: Three times a day (TID) | INTRAVENOUS | Status: AC
  Administered 2014-06-04 – 2014-06-05 (×2): 2 g via INTRAVENOUS
  Filled 2014-06-04 (×2): qty 50

## 2014-06-04 MED ORDER — ACETAMINOPHEN 10 MG/ML IV SOLN
INTRAVENOUS | Status: DC | PRN
  Administered 2014-06-04: 1000 mg via INTRAVENOUS

## 2014-06-04 MED ORDER — ACETAMINOPHEN 325 MG PO TABS: 650.0000 mg | ORAL_TABLET | Freq: Four times a day (QID) | ORAL | Status: DC | PRN

## 2014-06-04 MED ORDER — HYDROMORPHONE HCL 1 MG/ML IJ SOLN
INTRAMUSCULAR | Status: AC
  Filled 2014-06-04: qty 1

## 2014-06-04 MED ORDER — LIDOCAINE HCL (CARDIAC) 20 MG/ML IV SOLN
INTRAVENOUS | Status: AC
  Filled 2014-06-04: qty 10

## 2014-06-04 MED ORDER — MORPHINE SULFATE 4 MG/ML IJ SOLN
INTRAMUSCULAR | Status: DC | PRN
  Administered 2014-06-04: 4 mg

## 2014-06-04 MED ORDER — EPHEDRINE SULFATE 50 MG/ML IJ SOLN
INTRAMUSCULAR | Status: DC | PRN
Start: 2014-06-04 — End: 2014-06-04
  Administered 2014-06-04 (×2): 5 mg via INTRAVENOUS

## 2014-06-04 MED ORDER — STERILE WATER FOR INJECTION IJ SOLN
INTRAMUSCULAR | Status: AC
  Filled 2014-06-04: qty 10

## 2014-06-04 MED ORDER — WARFARIN - PHARMACIST DOSING INPATIENT
Freq: Every day | Status: DC
  Administered 2014-06-07: 18:00:00

## 2014-06-04 MED ORDER — LIDOCAINE HCL (CARDIAC) 20 MG/ML IV SOLN
INTRAVENOUS | Status: AC
  Filled 2014-06-04: qty 5

## 2014-06-04 MED ORDER — METHOCARBAMOL 500 MG PO TABS
ORAL_TABLET | ORAL | Status: AC
  Filled 2014-06-04: qty 1

## 2014-06-04 MED ORDER — HYDROMORPHONE HCL 1 MG/ML IJ SOLN
0.2500 mg | INTRAMUSCULAR | Status: DC | PRN
  Administered 2014-06-04: 1 mg via INTRAVENOUS

## 2014-06-04 MED ORDER — CHLORHEXIDINE GLUCONATE 4 % EX LIQD
60.0000 mL | Freq: Once | CUTANEOUS | Status: DC
  Filled 2014-06-04: qty 60

## 2014-06-04 MED ORDER — METHOCARBAMOL 500 MG PO TABS
500.0000 mg | ORAL_TABLET | Freq: Four times a day (QID) | ORAL | Status: DC | PRN
  Administered 2014-06-04 – 2014-06-06 (×2): 500 mg via ORAL
  Filled 2014-06-04: qty 1

## 2014-06-04 MED ORDER — FENTANYL CITRATE 0.05 MG/ML IJ SOLN
INTRAMUSCULAR | Status: DC | PRN
  Administered 2014-06-04 (×3): 50 ug via INTRAVENOUS
  Administered 2014-06-04: 150 ug via INTRAVENOUS
  Administered 2014-06-04 (×2): 100 ug via INTRAVENOUS
  Administered 2014-06-04: 50 ug via INTRAVENOUS

## 2014-06-04 MED ORDER — ONDANSETRON HCL 4 MG/2ML IJ SOLN
INTRAMUSCULAR | Status: DC | PRN
  Administered 2014-06-04 (×2): 4 mg via INTRAVENOUS

## 2014-06-04 MED ORDER — ONDANSETRON HCL 4 MG/2ML IJ SOLN
4.0000 mg | Freq: Four times a day (QID) | INTRAMUSCULAR | Status: DC | PRN
  Administered 2014-06-04 – 2014-06-07 (×4): 4 mg via INTRAVENOUS
  Filled 2014-06-04 (×4): qty 2

## 2014-06-04 MED ORDER — WARFARIN SODIUM 5 MG PO TABS
5.0000 mg | ORAL_TABLET | Freq: Once | ORAL | Status: AC
  Administered 2014-06-04: 5 mg via ORAL
  Filled 2014-06-04: qty 1

## 2014-06-04 MED ORDER — AMLODIPINE BESYLATE 5 MG PO TABS
5.0000 mg | ORAL_TABLET | Freq: Every day | ORAL | Status: DC
  Administered 2014-06-04 – 2014-06-09 (×6): 5 mg via ORAL
  Filled 2014-06-04 (×6): qty 1

## 2014-06-04 MED ORDER — ACETAMINOPHEN 650 MG RE SUPP: 650.0000 mg | Freq: Four times a day (QID) | RECTAL | Status: DC | PRN

## 2014-06-04 MED ORDER — OXYCODONE HCL 5 MG PO TABS
5.0000 mg | ORAL_TABLET | ORAL | Status: DC | PRN
  Administered 2014-06-04 – 2014-06-09 (×9): 10 mg via ORAL
  Filled 2014-06-04 (×10): qty 2

## 2014-06-04 MED ORDER — ARTIFICIAL TEARS OP OINT
TOPICAL_OINTMENT | OPHTHALMIC | Status: DC | PRN
  Administered 2014-06-04: 1 via OPHTHALMIC

## 2014-06-04 MED ORDER — CELECOXIB 200 MG PO CAPS
ORAL_CAPSULE | ORAL | Status: AC
  Filled 2014-06-04: qty 1

## 2014-06-04 MED ORDER — CLONIDINE HCL (ANALGESIA) 100 MCG/ML EP SOLN
150.0000 ug | Freq: Once | EPIDURAL | Status: DC
  Filled 2014-06-04: qty 1.5

## 2014-06-04 MED ORDER — ATORVASTATIN CALCIUM 20 MG PO TABS
20.0000 mg | ORAL_TABLET | Freq: Every day | ORAL | Status: DC
  Administered 2014-06-04 – 2014-06-09 (×6): 20 mg via ORAL
  Filled 2014-06-04 (×6): qty 1

## 2014-06-04 MED ORDER — PHENOL 1.4 % MT LIQD: 1.0000 | OROMUCOSAL | Status: DC | PRN

## 2014-06-04 MED ORDER — DEXTROSE 5 % IV SOLN
500.0000 mg | Freq: Four times a day (QID) | INTRAVENOUS | Status: DC | PRN
  Filled 2014-06-04: qty 5

## 2014-06-04 MED ORDER — MIDAZOLAM HCL 5 MG/5ML IJ SOLN
INTRAMUSCULAR | Status: DC | PRN
Start: 2014-06-04 — End: 2014-06-04
  Administered 2014-06-04: 2 mg via INTRAVENOUS

## 2014-06-04 MED ORDER — MORPHINE SULFATE 4 MG/ML IJ SOLN
INTRAMUSCULAR | Status: AC
  Filled 2014-06-04: qty 2

## 2014-06-04 MED ORDER — EPHEDRINE SULFATE 50 MG/ML IJ SOLN
INTRAMUSCULAR | Status: AC
  Filled 2014-06-04: qty 1

## 2014-06-04 MED ORDER — MIDAZOLAM HCL 2 MG/2ML IJ SOLN
INTRAMUSCULAR | Status: AC
  Filled 2014-06-04: qty 2

## 2014-06-04 MED ORDER — CELECOXIB 200 MG PO CAPS
200.0000 mg | ORAL_CAPSULE | Freq: Two times a day (BID) | ORAL | Status: DC
  Administered 2014-06-04 – 2014-06-09 (×11): 200 mg via ORAL
  Filled 2014-06-04 (×11): qty 1

## 2014-06-04 MED ORDER — ACETAMINOPHEN 500 MG PO TABS
1000.0000 mg | ORAL_TABLET | Freq: Four times a day (QID) | ORAL | Status: AC
  Administered 2014-06-04 – 2014-06-05 (×3): 1000 mg via ORAL
  Filled 2014-06-04 (×4): qty 2

## 2014-06-04 MED ORDER — SODIUM CHLORIDE 0.9 % IJ SOLN
INTRAMUSCULAR | Status: DC | PRN
  Administered 2014-06-04 (×2): 40 mL

## 2014-06-04 MED ORDER — ROCURONIUM BROMIDE 100 MG/10ML IV SOLN
INTRAVENOUS | Status: DC | PRN
  Administered 2014-06-04: 50 mg via INTRAVENOUS

## 2014-06-04 MED ORDER — LIDOCAINE HCL (CARDIAC) 20 MG/ML IV SOLN
INTRAVENOUS | Status: DC | PRN
  Administered 2014-06-04: 70 mg via INTRAVENOUS
  Administered 2014-06-04: 50 mg via INTRAVENOUS

## 2014-06-04 MED ORDER — OXYCODONE HCL ER 10 MG PO T12A
10.0000 mg | EXTENDED_RELEASE_TABLET | Freq: Two times a day (BID) | ORAL | Status: DC
  Administered 2014-06-04 – 2014-06-09 (×10): 10 mg via ORAL
  Filled 2014-06-04 (×11): qty 1

## 2014-06-04 MED ORDER — LACTATED RINGERS IV SOLN
INTRAVENOUS | Status: DC | PRN
  Administered 2014-06-04 (×3): via INTRAVENOUS

## 2014-06-04 MED ORDER — OXYCODONE HCL 5 MG PO TABS
ORAL_TABLET | ORAL | Status: AC
  Filled 2014-06-04: qty 2

## 2014-06-04 MED ORDER — PHENYLEPHRINE 40 MCG/ML (10ML) SYRINGE FOR IV PUSH (FOR BLOOD PRESSURE SUPPORT)
PREFILLED_SYRINGE | INTRAVENOUS | Status: AC
  Filled 2014-06-04: qty 10

## 2014-06-04 MED ORDER — CLONIDINE HCL (ANALGESIA) 100 MCG/ML EP SOLN
EPIDURAL | Status: DC | PRN
  Administered 2014-06-04: .5 mL

## 2014-06-04 MED ORDER — ACETAMINOPHEN 10 MG/ML IV SOLN
1000.0000 mg | Freq: Four times a day (QID) | INTRAVENOUS | Status: AC
  Administered 2014-06-04: 1000 mg via INTRAVENOUS
  Filled 2014-06-04 (×3): qty 100

## 2014-06-04 MED ORDER — TRANEXAMIC ACID 100 MG/ML IV SOLN
1000.0000 mg | INTRAVENOUS | Status: AC
  Administered 2014-06-04: 1000 mg via INTRAVENOUS
  Filled 2014-06-04: qty 10

## 2014-06-04 MED ORDER — MENTHOL 3 MG MT LOZG: 1.0000 | LOZENGE | OROMUCOSAL | Status: DC | PRN

## 2014-06-04 MED ORDER — BUPIVACAINE LIPOSOME 1.3 % IJ SUSP
40.0000 mL | INTRAMUSCULAR | Status: AC
  Administered 2014-06-04: 20 mL
  Filled 2014-06-04: qty 40

## 2014-06-04 MED ORDER — ESMOLOL HCL 10 MG/ML IV SOLN
INTRAVENOUS | Status: AC
  Filled 2014-06-04: qty 10

## 2014-06-04 MED ORDER — GLYCOPYRROLATE 0.2 MG/ML IJ SOLN
INTRAMUSCULAR | Status: AC
  Filled 2014-06-04: qty 3

## 2014-06-04 SURGICAL SUPPLY — 81 items
BANDAGE ELASTIC 4 VELCRO ST LF (GAUZE/BANDAGES/DRESSINGS) ×5 IMPLANT
BANDAGE ELASTIC 6 VELCRO ST LF (GAUZE/BANDAGES/DRESSINGS) ×2 IMPLANT
BANDAGE ESMARK 6X9 LF (GAUZE/BANDAGES/DRESSINGS) ×1 IMPLANT
BLADE SAG 18X100X1.27 (BLADE) ×5 IMPLANT
BLADE SAW SGTL 13.0X1.19X90.0M (BLADE) ×6 IMPLANT
BNDG CMPR 9X6 STRL LF SNTH (GAUZE/BANDAGES/DRESSINGS) ×2
BNDG CMPR MED 10X6 ELC LF (GAUZE/BANDAGES/DRESSINGS) ×5
BNDG COHESIVE 6X5 TAN STRL LF (GAUZE/BANDAGES/DRESSINGS) ×7 IMPLANT
BNDG ELASTIC 6X10 VLCR STRL LF (GAUZE/BANDAGES/DRESSINGS) ×15 IMPLANT
BNDG ESMARK 6X9 LF (GAUZE/BANDAGES/DRESSINGS) ×6
BOWL SMART MIX CTS (DISPOSABLE) ×5 IMPLANT
CAPT KNEE TOTAL 3 ×4 IMPLANT
CEMENT BONE SIMPLEX SPEEDSET (Cement) ×8 IMPLANT
COVER SURGICAL LIGHT HANDLE (MISCELLANEOUS) ×3 IMPLANT
CUFF TOURNIQUET SINGLE 34IN LL (TOURNIQUET CUFF) ×6 IMPLANT
CUFF TOURNIQUET SINGLE 44IN (TOURNIQUET CUFF) IMPLANT
DRAPE EXTREMITY BILATERAL (DRAPE) ×2 IMPLANT
DRAPE IMP U-DRAPE 54X76 (DRAPES) ×5 IMPLANT
DRAPE INCISE IOBAN 66X45 STRL (DRAPES) ×6 IMPLANT
DRAPE ORTHO SPLIT 77X108 STRL (DRAPES) ×9
DRAPE SURG ORHT 6 SPLT 77X108 (DRAPES) ×3 IMPLANT
DRAPE U-SHAPE 47X51 STRL (DRAPES) ×6 IMPLANT
DRSG AQUACEL AG ADV 3.5X10 (GAUZE/BANDAGES/DRESSINGS) ×3 IMPLANT
DRSG PAD ABDOMINAL 8X10 ST (GAUZE/BANDAGES/DRESSINGS) ×6 IMPLANT
DURAPREP 26ML APPLICATOR (WOUND CARE) ×6 IMPLANT
ELECT REM PT RETURN 9FT ADLT (ELECTROSURGICAL) ×3
ELECTRODE REM PT RTRN 9FT ADLT (ELECTROSURGICAL) ×1 IMPLANT
FACESHIELD WRAPAROUND (MASK) IMPLANT
GAUZE SPONGE 4X4 12PLY STRL (GAUZE/BANDAGES/DRESSINGS) ×5 IMPLANT
GAUZE XEROFORM 1X8 LF (GAUZE/BANDAGES/DRESSINGS) ×5 IMPLANT
GAUZE XEROFORM 5X9 LF (GAUZE/BANDAGES/DRESSINGS) ×1 IMPLANT
GLOVE BIOGEL PI IND STRL 7.5 (GLOVE) ×1 IMPLANT
GLOVE BIOGEL PI IND STRL 8 (GLOVE) ×1 IMPLANT
GLOVE BIOGEL PI INDICATOR 7.5 (GLOVE)
GLOVE BIOGEL PI INDICATOR 8 (GLOVE) ×2
GLOVE ECLIPSE 7.0 STRL STRAW (GLOVE) ×6 IMPLANT
GLOVE SURG ORTHO 8.0 STRL STRW (GLOVE) ×3 IMPLANT
GOWN STRL REUS W/ TWL LRG LVL3 (GOWN DISPOSABLE) ×2 IMPLANT
GOWN STRL REUS W/ TWL XL LVL3 (GOWN DISPOSABLE) ×2 IMPLANT
GOWN STRL REUS W/TWL LRG LVL3 (GOWN DISPOSABLE) ×6
GOWN STRL REUS W/TWL XL LVL3 (GOWN DISPOSABLE) ×6
HANDPIECE INTERPULSE COAX TIP (DISPOSABLE) ×3
HOOD PEEL AWAY FACE SHEILD DIS (HOOD) ×13 IMPLANT
IMMOBILIZER KNEE 20 (SOFTGOODS) IMPLANT
IMMOBILIZER KNEE 22 (SOFTGOODS) ×2 IMPLANT
IMMOBILIZER KNEE 22 UNIV (SOFTGOODS) ×6 IMPLANT
IMMOBILIZER KNEE 24 THIGH 36 (MISCELLANEOUS) IMPLANT
IMMOBILIZER KNEE 24 UNIV (MISCELLANEOUS)
KIT BASIN OR (CUSTOM PROCEDURE TRAY) ×3 IMPLANT
KIT ROOM TURNOVER OR (KITS) ×3 IMPLANT
MANIFOLD NEPTUNE II (INSTRUMENTS) ×3 IMPLANT
NDL 18GX1X1/2 (RX/OR ONLY) (NEEDLE) ×1 IMPLANT
NEEDLE 18GX1X1/2 (RX/OR ONLY) (NEEDLE) ×3 IMPLANT
NEEDLE 21X1 OR PACK (NEEDLE) ×3 IMPLANT
NEEDLE HYPO 22GX1.5 SAFETY (NEEDLE) ×3 IMPLANT
NEEDLE SPNL 18GX3.5 QUINCKE PK (NEEDLE) ×3 IMPLANT
NS IRRIG 1000ML POUR BTL (IV SOLUTION) ×4 IMPLANT
PACK TOTAL JOINT (CUSTOM PROCEDURE TRAY) ×3 IMPLANT
PACK UNIVERSAL I (CUSTOM PROCEDURE TRAY) ×5 IMPLANT
PAD ARMBOARD 7.5X6 YLW CONV (MISCELLANEOUS) ×6 IMPLANT
PAD CAST 4YDX4 CTTN HI CHSV (CAST SUPPLIES) ×1 IMPLANT
PADDING CAST COTTON 4X4 STRL (CAST SUPPLIES)
PADDING CAST COTTON 6X4 STRL (CAST SUPPLIES) ×12 IMPLANT
RUBBERBAND STERILE (MISCELLANEOUS) IMPLANT
SET HNDPC FAN SPRY TIP SCT (DISPOSABLE) ×1 IMPLANT
SPONGE LAP 18X18 X RAY DECT (DISPOSABLE) ×2 IMPLANT
STAPLER VISISTAT 35W (STAPLE) ×3 IMPLANT
SUCTION FRAZIER TIP 10 FR DISP (SUCTIONS) ×3 IMPLANT
SUT VIC AB 0 CTB1 27 (SUTURE) ×9 IMPLANT
SUT VIC AB 1 CT1 27 (SUTURE) ×30
SUT VIC AB 1 CT1 27XBRD ANBCTR (SUTURE) ×5 IMPLANT
SUT VIC AB 2-0 CT1 27 (SUTURE) ×21
SUT VIC AB 2-0 CT1 TAPERPNT 27 (SUTURE) ×7 IMPLANT
SYR 30ML LL (SYRINGE) ×5 IMPLANT
SYR 30ML SLIP (SYRINGE) ×3 IMPLANT
SYR 50ML LL SCALE MARK (SYRINGE) ×3 IMPLANT
SYR TB 1ML LUER SLIP (SYRINGE) ×5 IMPLANT
TOWEL OR 17X24 6PK STRL BLUE (TOWEL DISPOSABLE) ×6 IMPLANT
TOWEL OR 17X26 10 PK STRL BLUE (TOWEL DISPOSABLE) ×8 IMPLANT
TRAY FOLEY CATH 16FRSI W/METER (SET/KITS/TRAYS/PACK) ×3 IMPLANT
WATER STERILE IRR 1000ML POUR (IV SOLUTION) IMPLANT

## 2014-06-04 NOTE — Progress Notes (Signed)
Orthopedic Tech Progress Note Patient Details:  Pecola LeisureJames E Herbert 23-Jul-1950 [id number] Off cpms at 8:10 pm Patient ID: Pecola LeisureJames E Notaro, male   DOB: 23-Jul-1950, 64 y.o.   MRN: [id number]   Jennye MoccasinHughes, Sammie Schermerhorn Craig 06/04/2014, 8:11 PM

## 2014-06-04 NOTE — Progress Notes (Signed)
Orthopedic Tech Progress Note Patient Details:  Devin Castro August 22, 1950 [id number]  Patient ID: Devin Castro, male   DOB: August 22, 1950, 64 y.o.   MRN: [id number] Viewed order from doctor's order list  Nikki DomCrawford, Pasha Gadison 06/04/2014, 12:25 PM

## 2014-06-04 NOTE — Progress Notes (Addendum)
ANTICOAGULATION CONSULT NOTE - Initial Consult  Pharmacy Consult for coumadin Indication: VTE prophylaxis  No Known Allergies  Patient Measurements: Height: 5\' 11"  (180.3 cm) Weight: 222 lb (100.699 kg) IBW/kg (Calculated) : 75.3 Heparin Dosing Weight:   Vital Signs: Temp: 97.4 F (36.3 C) (01/19 1610) Temp Source: Oral (01/19 1610) BP: 156/87 mmHg (01/19 1610) Pulse Rate: 64 (01/19 1610)  Labs: No results for input(s): HGB, HCT, PLT, APTT, LABPROT, INR, HEPARINUNFRC, CREATININE, CKTOTAL, CKMB, TROPONINI in the last 72 hours.  Estimated Creatinine Clearance: 94.8 mL/min (by C-G formula based on Cr of 0.99).   Medical History: Past Medical History  Diagnosis Date  . Hypertension   . COPD (chronic obstructive pulmonary disease)   . Vertigo   . Kidney stone     Medications:  Scheduled:  . acetaminophen  1,000 mg Intravenous 4 times per day  . acetaminophen  1,000 mg Oral 4 times per day  . amLODipine  5 mg Oral Daily  . atorvastatin  20 mg Oral Daily  .  ceFAZolin (ANCEF) IV  2 g Intravenous 3 times per day  . celecoxib      . celecoxib  200 mg Oral Q12H  . HYDROmorphone      . losartan  100 mg Oral Daily  . methocarbamol      . metoCLOPramide      . mometasone-formoterol  1 puff Inhalation Daily  . nebivolol  10 mg Oral Daily  . oxyCODONE      . OxyCODONE  10 mg Oral Q12H   Infusions:  . 0.9 % NaCl with KCl 20 mEq / L      Assessment: 64 yo male s/p TKA will be started on coumadin for VTE prophylaxis.  INR on 01/08 was 1.04.  Patient was not on any anticoagulants prior to admission.  Coumadin score = 6.  He is on celebrex which may affect the effect of coumadin.   Goal of Therapy:  INR 2-3 Monitor platelets by anticoagulation protocol: Yes   Plan:  - coumadin 5 mg po x1 - INR in am  Sylvanna Burggraf, Tsz-Yin 06/04/2014,4:30 PM

## 2014-06-04 NOTE — Progress Notes (Signed)
Spoke by phone with dr dean directly  in the or re: pulses bounding on R, by doppler only on L, sttaes he could weakly palpate same on L before surgery. Cap refill is not delayed,color normal, foot kept wrapped in warm blanket in pacu since arrival, will monitor.

## 2014-06-04 NOTE — Anesthesia Procedure Notes (Addendum)
Procedure Name: Intubation Date/Time: 06/04/2014 7:30 AM Performed by: Wray KearnsFOLEY, ROGER A Pre-anesthesia Checklist: Patient identified, Emergency Drugs available, Suction available, Patient being monitored and Timeout performed Patient Re-evaluated:Patient Re-evaluated prior to inductionOxygen Delivery Method: Circle system utilized Preoxygenation: Pre-oxygenation with 100% oxygen Intubation Type: IV induction and Cricoid Pressure applied Ventilation: Mask ventilation without difficulty and Oral airway inserted - appropriate to patient size Laryngoscope Size: Mac and 4 Grade View: Grade I Tube type: Oral Tube size: 8.0 mm Number of attempts: 1 Airway Equipment and Method: Stylet Placement Confirmation: ETT inserted through vocal cords under direct vision,  positive ETCO2 and breath sounds checked- equal and bilateral Secured at: 23 cm Tube secured with: Tape Dental Injury: Teeth and Oropharynx as per pre-operative assessment    Anesthesia Regional Block:  Adductor canal block  Pre-Anesthetic Checklist: ,, timeout performed, Correct Patient, Correct Site, Correct Laterality, Correct Procedure, Correct Position, site marked, Risks and benefits discussed,  Surgical consent,  Pre-op evaluation,  At surgeon's request and post-op pain management  Laterality: Left and Right  Prep: chloraprep       Needles:   Needle Type: Stimulator Needle - 80          Additional Needles:  Procedures: Doppler guided, ultrasound guided (picture in chart) and nerve stimulator Adductor canal block Narrative:  Start time: 06/04/2014 6:50 AM End time: 06/04/2014 7:15 AM Injection made incrementally with aspirations every 5 mL.  Performed by: Personally

## 2014-06-04 NOTE — Anesthesia Preprocedure Evaluation (Addendum)
Anesthesia Evaluation  Patient identified by MRN, date of birth, ID band Patient awake    Airway Mallampati: II  TM Distance: >3 FB Neck ROM: Full    Dental   Pulmonary COPDformer smoker,  breath sounds clear to auscultation        Cardiovascular hypertension, Rhythm:Regular Rate:Normal     Neuro/Psych    GI/Hepatic negative GI ROS, Neg liver ROS,   Endo/Other  negative endocrine ROS  Renal/GU Renal disease     Musculoskeletal   Abdominal   Peds  Hematology   Anesthesia Other Findings   Reproductive/Obstetrics                            Anesthesia Physical Anesthesia Plan  ASA: III  Anesthesia Plan: General   Post-op Pain Management:    Induction: Intravenous  Airway Management Planned: Oral ETT  Additional Equipment:   Intra-op Plan:   Post-operative Plan: Extubation in OR  Informed Consent:   Dental advisory given  Plan Discussed with: CRNA, Anesthesiologist and Surgeon  Anesthesia Plan Comments:        Anesthesia Quick Evaluation

## 2014-06-04 NOTE — Anesthesia Postprocedure Evaluation (Signed)
  Anesthesia Post-op Note  Patient: Devin Castro  Procedure(s) Performed: Procedure(s): TOTAL KNEE BILATERAL (Bilateral)  Patient Location: PACU  Anesthesia Type:General  Level of Consciousness: awake  Airway and Oxygen Therapy: Patient Spontanous Breathing  Post-op Pain: mild  Post-op Assessment: Post-op Vital signs reviewed  Post-op Vital Signs: Reviewed  Last Vitals:  Filed Vitals:   06/04/14 1234  BP: 142/85  Pulse:   Temp:   Resp:     Complications: No apparent anesthesia complications

## 2014-06-04 NOTE — Brief Op Note (Signed)
06/04/2014  11:32 AM  PATIENT:  Devin LeisureJames E Hilscher  64 y.o. male  PRE-OPERATIVE DIAGNOSIS:  RIGHT KNEE OSTEOARTHRITIS  POST-OPERATIVE DIAGNOSIS:  OA  PROCEDURE:  Procedure(s): TOTAL KNEE BILATERAL  SURGEON:  Surgeon(s): Cammy CopaGregory Scott Dean, MD  ASSISTANT: Clarnce FlockJ owens pa carla bethune rnfa April green rnfa  ANESTHESIA:   general  EBL: 150 ml    Total I/O In: 2050 [I.V.:2050] Out: 350 [Urine:300; Blood:50]  BLOOD ADMINISTERED: none  DRAINS: none   LOCAL MEDICATIONS USED:  mso4 - clonidine - exparel  SPECIMEN:  No Specimen  COUNTS:  YES  TOURNIQUET:   Total Tourniquet Time Documented: Thigh (Left) - 71 minutes Total: Thigh (Left) - 71 minutes  Thigh (Right) - 79 minutes Total: Thigh (Right) - 79 minutes   DICTATION: .Other Dictation: Dictation Number [id number]  PLAN OF CARE: Admit for overnight observation  PATIENT DISPOSITION:  PACU - hemodynamically stable

## 2014-06-04 NOTE — Progress Notes (Signed)
Orthopedic Tech Progress Note Patient Details:  Pecola LeisureJames E Havlin [birth year] [id number]  CPM Left Knee CPM Left Knee: On Left Knee Flexion (Degrees): 40 Left Knee Extension (Degrees): 0 Additional Comments: trapeze bar patient helper CPM Right Knee CPM Right Knee: On Right Knee Flexion (Degrees): 40 Right Knee Extension (Degrees): 0 Additional Comments: bilateral   Nikki DomCrawford, Remmi Armenteros 06/04/2014, 12:24 PM

## 2014-06-04 NOTE — H&P (Signed)
TOTAL KNEE ADMISSION H&P  Patient is being admitted for bilaterally total knee arthroplasty.  Subjective:  Chief Complaint:bilaterally knee pain.  HPI: Devin Castro, 64 y.o. male, has a history of pain and functional disability in the bilaterally knee due to arthritis and has failed non-surgical conservative treatments for greater than 12 weeks to includeNSAID's and/or analgesics, corticosteriod injections, viscosupplementation injections, supervised PT with diminished ADL's post treatment and activity modification.  Onset of symptoms was gradual, starting >10 years ago with gradually worsening course since that time. The patient noted no past surgery on the bilaterally knee(s).  Patient currently rates pain in the bilaterally knee(s) at 8 out of 10 with activity. Patient has night pain, worsening of pain with activity and weight bearing, pain that interferes with activities of daily living, pain with passive range of motion and joint swelling.  Patient has evidence of subchondral cysts, subchondral sclerosis and joint subluxation by imaging studies. This patient has had significant pain in both knees for a long time. There is no active infection.  There are no active problems to display for this patient.  Past Medical History  Diagnosis Date  . Hypertension   . COPD (chronic obstructive pulmonary disease)   . Vertigo   . Kidney stone     Past Surgical History  Procedure Laterality Date  . Kidney stone removal    . Cardiac catheterization    . Colonoscopy      Prescriptions prior to admission  Medication Sig Dispense Refill Last Dose  . amLODipine (NORVASC) 5 MG tablet Take 5 mg by mouth daily.   06/03/2014 at Unknown time  . atorvastatin (LIPITOR) 20 MG tablet Take 20 mg by mouth daily.   06/03/2014 at Unknown time  . losartan (COZAAR) 100 MG tablet Take 100 mg by mouth daily.   06/03/2014 at Unknown time  . Mometasone Furo-Formoterol Fum 200-5 MCG/ACT AERO Inhale 1 puff into the lungs  daily.    06/03/2014 at Unknown time  . nebivolol (BYSTOLIC) 10 MG tablet Take 10 mg by mouth daily.   06/03/2014 at Unknown time  . erythromycin ophthalmic ointment Place a 1/2 inch ribbon of ointment into the lower eyelid. (Patient not taking: Reported on 06/03/2014) 3.5 g 0    No Known Allergies  History  Substance Use Topics  . Smoking status: Former Smoker -- 20 years    Quit date: 08/16/2002  . Smokeless tobacco: Not on file  . Alcohol Use: No    History reviewed. No pertinent family history.   Review of Systems  Constitutional: Negative.   HENT: Negative.   Eyes: Negative.   Respiratory: Negative.   Cardiovascular: Negative.   Gastrointestinal: Negative.   Genitourinary: Negative.   Musculoskeletal: Positive for joint pain.  Skin: Negative.   Neurological: Negative.   Endo/Heme/Allergies: Negative.   Psychiatric/Behavioral: Negative.     Objective:  Physical Exam  Constitutional: He appears well-developed.  HENT:  Head: Normocephalic.  Eyes: Pupils are equal, round, and reactive to light.  Neck: Normal range of motion.  Cardiovascular: Normal rate.   Respiratory: Effort normal.  Neurological: He is alert.  Skin: Skin is warm.  Psychiatric: He has a normal mood and affect.  skin intact - pulses palpable - ext mech intact - varus alignment bil - left toes numb from prior surgery on back - ankle df - pf ok  Vital signs in last 24 hours: Temp:  [97.9 F (36.6 C)] 97.9 F (36.6 C) (01/19 0626) Pulse Rate:  [59] 59 (  01/19 0626) Resp:  [20] 20 (01/19 0626) BP: (158)/(89) 158/89 mmHg (01/19 0626) SpO2:  [96 %] 96 % (01/19 0626) Weight:  [100.699 kg (222 lb)] 100.699 kg (222 lb) (01/19 0626)  Labs:   Estimated body mass index is 30.98 kg/(m^2) as calculated from the following:   Height as of this encounter: 5\' 11"  (1.803 m).   Weight as of this encounter: 100.699 kg (222 lb).   Imaging Review Plain radiographs demonstrate severe degenerative joint disease of  the bilaterally knee(s). The overall alignment issignificant varus. The bone quality appears to be good for age and reported activity level.  Assessment/Plan:  End stage arthritis, bilaterally knee   The patient history, physical examination, clinical judgment of the provider and imaging studies are consistent with end stage degenerative joint disease of the bilaterally knee(s) and total knee arthroplasty is deemed medically necessary. The treatment options including medical management, injection therapy arthroscopy and arthroplasty were discussed at length. The risks and benefits of total knee arthroplasty were presented and reviewed. The risks due to aseptic loosening, infection, stiffness, patella tracking problems, thromboembolic complications and other imponderables were discussed. The patient acknowledged the explanation, agreed to proceed with the plan and consent was signed. Patient is being admitted for inpatient treatment for surgery, pain control, PT, OT, prophylactic antibiotics, VTE prophylaxis, progressive ambulation and ADL's and discharge planning. The patient is planning to be discharged home with home health services

## 2014-06-04 NOTE — Transfer of Care (Signed)
Immediate Anesthesia Transfer of Care Note  Patient: Devin LeisureJames E Manor  Procedure(s) Performed: Procedure(s): TOTAL KNEE BILATERAL (Bilateral)  Patient Location: PACU  Anesthesia Type:GA combined with regional for post-op pain  Level of Consciousness: oriented, sedated, patient cooperative and responds to stimulation  Airway & Oxygen Therapy: Patient Spontanous Breathing and Patient connected to nasal cannula oxygen  Post-op Assessment: Report given to PACU RN, Post -op Vital signs reviewed and stable, Patient moving all extremities and Patient moving all extremities X 4  Post vital signs: Reviewed and stable  Complications: No apparent anesthesia complications

## 2014-06-04 NOTE — Progress Notes (Signed)
Pt stable in rr Both feet mobile perfused and sensate Left foot pulse tr palpable but dopplerable consistent with pre op exam this am Right foot pulse stronger and palpable also consistent with exam this am In cpm currently Can be oob as tolerated tonight if able

## 2014-06-05 ENCOUNTER — Encounter (HOSPITAL_COMMUNITY): Payer: Self-pay | Admitting: Orthopedic Surgery

## 2014-06-05 LAB — CBC
HEMATOCRIT: 38.4 % — AB (ref 39.0–52.0)
Hemoglobin: 12.9 g/dL — ABNORMAL LOW (ref 13.0–17.0)
MCH: 32.3 pg (ref 26.0–34.0)
MCHC: 33.6 g/dL (ref 30.0–36.0)
MCV: 96.2 fL (ref 78.0–100.0)
PLATELETS: 187 10*3/uL (ref 150–400)
RBC: 3.99 MIL/uL — ABNORMAL LOW (ref 4.22–5.81)
RDW: 13.7 % (ref 11.5–15.5)
WBC: 11.5 10*3/uL — ABNORMAL HIGH (ref 4.0–10.5)

## 2014-06-05 LAB — BASIC METABOLIC PANEL
Anion gap: 9 (ref 5–15)
BUN: 14 mg/dL (ref 6–23)
CHLORIDE: 104 meq/L (ref 96–112)
CO2: 28 mmol/L (ref 19–32)
Calcium: 8.5 mg/dL (ref 8.4–10.5)
Creatinine, Ser: 1.12 mg/dL (ref 0.50–1.35)
GFR calc non Af Amer: 69 mL/min — ABNORMAL LOW (ref >90)
GFR, EST AFRICAN AMERICAN: 80 mL/min — AB (ref >90)
GLUCOSE: 127 mg/dL — AB (ref 70–99)
POTASSIUM: 4.8 mmol/L (ref 3.5–5.1)
Sodium: 141 mmol/L (ref 135–145)

## 2014-06-05 LAB — PROTIME-INR
INR: 1.29 (ref 0.00–1.49)
Prothrombin Time: 16.2 seconds — ABNORMAL HIGH (ref 11.6–15.2)

## 2014-06-05 MED ORDER — WARFARIN SODIUM 5 MG PO TABS
5.0000 mg | ORAL_TABLET | Freq: Once | ORAL | Status: AC
  Administered 2014-06-05: 5 mg via ORAL
  Filled 2014-06-05 (×2): qty 1

## 2014-06-05 NOTE — Progress Notes (Signed)
Physical Therapy Treatment Patient Details Name: Devin Castro MRN: [id number] DOB: 1950-12-15 Today's Date: 06/05/2014    History of Present Illness Pt is a 64 y.o. male s/p bil TKA.    PT Comments    Pt progressing slowly but is very motivated. Intermittent c/o nausea this session. Pt up in chair this session and tolerating it well. Will cont to follow per POC.  Follow Up Recommendations  Home health PT;Supervision/Assistance - 24 hour     Equipment Recommendations  None recommended by PT    Recommendations for Other Services       Precautions / Restrictions Precautions Precautions: Knee;Fall Precaution Booklet Issued: No Precaution Comments: educated on precautions Required Braces or Orthoses: Knee Immobilizer - Right;Knee Immobilizer - Left;Other Brace/Splint Other Brace/Splint: foam blocks when not in CPM and in bed Restrictions Weight Bearing Restrictions: Yes RLE Weight Bearing: Weight bearing as tolerated LLE Weight Bearing: Weight bearing as tolerated    Mobility  Bed Mobility Overal bed mobility: Modified Independent Bed Mobility: Supine to Sit     Supine to sit: Modified independent (Device/Increase time);HOB elevated Sit to supine: Min assist   General bed mobility comments: utilizing handrails   Transfers Overall transfer level: Needs assistance Equipment used: Rolling walker (2 wheeled) Transfers: Sit to/from [employer] Sit to Stand: Min assist;From elevated surface Stand pivot transfers: Min assist       General transfer comment: min (A) to balance and manage RW; cues for hand placement and sequencing; performed pre gt activity in standing; pt c/o slightly nauseated feeling   Ambulation/Gait             General Gait Details: pivotal steps to chair only   Stairs            Wheelchair Mobility    Modified Rankin (Stroke Patients Only)       Balance Overall balance assessment: Needs  assistance Sitting-balance support: Feet supported;No upper extremity supported Sitting balance-Leahy Scale: Fair     Standing balance support: During functional activity;Bilateral upper extremity supported Standing balance-Leahy Scale: Poor Standing balance comment: RW to balance at all times                     Cognition Arousal/Alertness: Awake/alert Behavior During Therapy: WFL for tasks assessed/performed Overall Cognitive Status: Within Functional Limits for tasks assessed                      Exercises Total Joint Exercises Ankle Circles/Pumps: AROM;Both;10 reps;Supine Quad Sets: AROM;Both;10 reps Hip ABduction/ADduction: AAROM;Both;10 reps;Supine Straight Leg Raises: AROM;Both;5 reps;Supine Long Arc Quad: AROM;Both;10 reps;Seated    General Comments        Pertinent Vitals/Pain Pain Assessment: 0-10 Pain Score: 4  Pain Location: bil knees; Rt > Lt knee Pain Descriptors / Indicators: Sore Pain Intervention(s): Monitored during session;Premedicated before session;Repositioned;Ice applied    Home Living Family/patient expects to be discharged to:: Private residence Living Arrangements: Spouse/significant other Available Help at Discharge: Family;Available 24 hours/day Type of Home: House Home Access: Stairs to enter Entrance Stairs-Rails: Right Home Layout: One level Home Equipment: Walker - 2 wheels      Prior Function Level of Independence: Independent          PT Goals (current goals can now be found in the care plan section) Acute Rehab PT Goals Patient Stated Goal: to go home over the weekend PT Goal Formulation: With patient Time For Goal Achievement: 06/12/14 Potential to Achieve Goals: Good Progress  towards PT goals: Progressing toward goals    Frequency  7X/week    PT Plan Current plan remains appropriate    Co-evaluation             End of Session Equipment Utilized During Treatment: Gait belt;Left knee  immobilizer;Right knee immobilizer Activity Tolerance: Patient tolerated treatment well Patient left: in chair;with call bell/phone within reach;with family/visitor present     Time: [id number] PT Time Calculation (min) (ACUTE ONLY): 28 min  Charges:  $Therapeutic Exercise: 8-22 mins $Therapeutic Activity: 8-22 mins                    G CodesDonell Sievert:      Enedelia Martorelli N, [place]PT  [phone] 06/05/2014, 5:12 PM

## 2014-06-05 NOTE — Progress Notes (Signed)
Orthopedic Tech Progress Note Patient Details:  Pecola LeisureJames E Hershberger 09-12-1950 [id number]  Patient ID: Pecola LeisureJames E Triggs, male   DOB: 09-12-1950, 64 y.o.   MRN: [id number] Placed pt's rle/lle in cpms@ 0-45 degrees @1445   Nikki DomCrawford, Ariely Riddell 06/05/2014, 2:40 PM

## 2014-06-05 NOTE — Progress Notes (Signed)
Utilization review completed. Kassady Laboy, RN, BSN.q 

## 2014-06-05 NOTE — Evaluation (Signed)
Physical Therapy Evaluation Patient Details Name: Devin Castro MRN: [id number] DOB: 27-Feb-1951 Today's Date: 06/05/2014   History of Present Illness  Pt is a 64 y.o. male s/p bil TKA.  Clinical Impression  Pt is s/p bil TKA POD#1 resulting in the deficits listed below (see PT Problem List). Pt will benefit from skilled PT to increase their independence and safety with mobility to allow discharge to the venue listed below. Pt limited by nausea to lateral steps [facility]. Anticipate good progress with PT, pt very motivated.      Follow Up Recommendations Home health PT;Supervision/Assistance - 24 hour    Equipment Recommendations  None recommended by PT    Recommendations for Other Services OT consult     Precautions / Restrictions Precautions Precautions: Knee;Fall Precaution Comments: reviewed no pillow under knees  Required Braces or Orthoses: Knee Immobilizer - Right;Knee Immobilizer - Left;Other Brace/Splint Other Brace/Splint: foam blocks when not in CPM and in bed Restrictions Weight Bearing Restrictions: Yes RLE Weight Bearing: Weight bearing as tolerated LLE Weight Bearing: Weight bearing as tolerated      Mobility  Bed Mobility Overal bed mobility: Needs Assistance Bed Mobility: Supine to Sit;Sit to Supine     Supine to sit: HOB elevated;Min guard Sit to supine: Min assist   General bed mobility comments: cues for technique; min guard to control LEs off EOB to floor, pt relying on handrails; min (A) to bring LEs back into supine position at end of session due to fatigue  Transfers Overall transfer level: Needs assistance Equipment used: Rolling walker (2 wheeled) Transfers: Sit to/from Stand Sit to Stand: Min assist;+2 safety/equipment;From elevated surface         General transfer comment: bed elevated; cues for hand placement and sequencing; 2nd person for safety   Ambulation/Gait Ambulation/Gait assistance: +2 safety/equipment;Min assist Ambulation  Distance (Feet): 6 Feet Assistive device: Rolling walker (2 wheeled)       General Gait Details: [facility] steps to [facility]; limited by nausea; cues for RW management and technique   Stairs            Wheelchair Mobility    Modified Rankin (Stroke Patients Only)       Balance Overall balance assessment: Needs assistance Sitting-balance support: Feet supported;No upper extremity supported Sitting balance-Leahy Scale: Fair Sitting balance - Comments: dizziness initially; sat EOB ~5 min for dizziness to subside    Standing balance support: During functional activity;Bilateral upper extremity supported Standing balance-Leahy Scale: Poor Standing balance comment: RW to balance                              Pertinent Vitals/Pain Pain Assessment: 0-10 Pain Score: 2  Pain Location: Rt LE > Lt LE  Pain Descriptors / Indicators: Aching Pain Intervention(s): Monitored during session;Premedicated before session;Repositioned    Home Living Family/patient expects to be discharged to:: Private residence Living Arrangements: Spouse/significant other Available Help at Discharge: Family;Available 24 hours/day Type of Home: House Home Access: Stairs to enter Entrance Stairs-Rails: Right Entrance Stairs-Number of Steps: 2 Home Layout: One level Home Equipment: Walker - 2 wheels;Bedside commode Additional Comments: pt has walk in shower    Prior Function Level of Independence: Independent               Hand Dominance   Dominant Hand: Right    Extremity/Trunk Assessment   Upper Extremity Assessment: Defer to OT evaluation  Lower Extremity Assessment: LLE deficits/detail;RLE deficits/detail RLE Deficits / Details: 5 to 45 degreens in supine; limited by pain and ACE wrap LLE Deficits / Details: 3 to 55 degrees in supine; limited by pain and ACE wrap   Cervical / Trunk Assessment: Normal  Communication   Communication: No difficulties  Cognition  Arousal/Alertness: Awake/alert Behavior During Therapy: WFL for tasks assessed/performed Overall Cognitive Status: Within Functional Limits for tasks assessed                      General Comments      Exercises Total Joint Exercises Ankle Circles/Pumps: AROM;Both;10 reps;Supine Quad Sets: AROM;Both;10 reps Heel Slides: AAROM;Both;10 reps;Supine Hip ABduction/ADduction: AAROM;Both;10 reps;Supine      Assessment/Plan    PT Assessment Patient needs continued PT services  PT Diagnosis Difficulty walking;Generalized weakness;Acute pain   PT Problem List Decreased strength;Decreased range of motion;Decreased activity tolerance;Decreased balance;Decreased mobility;Decreased knowledge of use of DME;Pain  PT Treatment Interventions DME instruction;Gait training;Stair training;Functional mobility training;Therapeutic activities;Therapeutic exercise;Balance training;Neuromuscular re-education;Patient/family education   PT Goals (Current goals can be found in the Care Plan section) Acute Rehab PT Goals Patient Stated Goal: to go home PT Goal Formulation: With patient Time For Goal Achievement: 06/12/14 Potential to Achieve Goals: Good    Frequency 7X/week   Barriers to discharge        Co-evaluation               End of Session Equipment Utilized During Treatment: Gait belt;Left knee immobilizer;Right knee immobilizer Activity Tolerance: Other (comment) (limited mobility due to nausea ) Patient left: in bed;with call bell/phone within reach;with family/visitor present Nurse Communication: Mobility status;Weight bearing status         Time: 1012-1037 PT Time Calculation (min) (ACUTE ONLY): 25 min   Charges:   PT Evaluation $Initial PT Evaluation Tier I: 1 Procedure PT Treatments $Therapeutic Exercise: 8-22 mins   PT G CodesDonell Sievert, Martorell  [phone] 06/05/2014, 11:49 AM

## 2014-06-05 NOTE — Op Note (Signed)
NAMEMAURI, TOLEN NO.:  [id number]  MEDICAL RECORD NO.:  [id number]  LOCATION:  [id number]                        FACILITY:  [facility]  PHYSICIAN:  Burnard Bunting, M.D.    DATE OF BIRTH:  1950-06-17  DATE OF PROCEDURE:  06/04/2014 DATE OF DISCHARGE:                              OPERATIVE REPORT   PREOPERATIVE DIAGNOSIS:  Bilateral knee varus arthritis.  POSTOPERATIVE DIAGNOSIS:  Bilateral knee varus arthritis.  PROCEDURE:  Bilateral total knee replacement.  SURGEON:  Burnard Bunting, M.D. __________  ASSISTANTS:  Genene Churn. Barry Dienes, P.A., Patrick Jupiter, RNFA. __________  INDICATIONS:  Holston Oyama is a 64 year old patient with bilateral knee pain, presents for operative management after explanation of risks and benefits and failure of conservative management.  COMPONENTS UTILIZED:  On the left knee, the [employer] Triathlon posterior cruciate retaining knee size 6 femur, 7 tibia, 11 poly, 38 3-pegged cemented patella.  Components right knee, posterior cruciate retaining Triathlon knee 6 femur, 7 tibial base plate, 16 poly spacer and 38 3- pegged patella.  PROCEDURE IN DETAIL:  For the left, the patient was brought to the operating room where general endotracheal anesthesia was placed. Preoperative antibiotics were administered.  Time-out was called.  Both legs were prescrubbed with alcohol, Betadine, and allowed to dry. Prepped with DuraPrep solution, draped in sterile manner.  Collier Flowers was used cover both operative fields.  Time-out was called.  Left leg was elevated, exsanguinated with Esmarch wrap.  Tourniquet was inflated. Total tourniquet time on the left 70 minutes at 300 mmHg.  Anterior approach to knee was made.  Skin and subcutaneous  tissue were sharply divided.  Median parapatellar approach was made and marked with #1 Vicryl suture.  Fat pad was partially excised.  Soft tissue dissection performed medially for the varus knee.  Soft tissue also elevated in  an extraperiosteal fashion off the distal anterior femur.  Lateral patellofemoral ligament was released.  Intramedullary alignment was then used with a 2 mm resection gauged off the most affected medial tibial plateau.  Posterior structures and collaterals were protected while the cut was made with the oscillating saw.  The femur was then prepared using intramedullary technique.  An 8 mm cut was made on the distal femur, it was sized to a size 6 chamfer cuts.  Chamfer cuts were made with the collaterals protected.  Good extension was achieved with a 9 and 11 mm temporary spacer at this time.  Flexion gap symmetric to the extension gaps.  The tibia was then sized to a size 7 and keel punched. The patella was then cut from 26-16 and 3-pegged trial patella was placed with a 9 and 11 mm spacer and the patient had good stability varus-valgus stress at 0, 30, and 90 degrees.  Little bit of of hyperextension with a 9 mm spacer, __________ 13 spacer.  The 11 mm spacer had a little bit of hyperextension when with the 13 spacer trial and the patient had excellent stability full extension and excellent patellar tracking, no thumbs technique.  Trial components were removed. Thorough irrigation 3 L of irrigating solution performed.  Exparel injected into the capsule around the knee.  True  components were then cemented into position and 13 poly spacer gave excellent stability, full extension.  The tourniquet was released.  Bleeding points controlled with electrocautery.  Thorough irrigation again performed and the knee was closed over bolster using #1 Vicryl suture for the arthrotomy, 0 Vicryl suture, 2-0 Vicryl suture and skin staples.  Solution of clonidine and morphine injected into the knee.  At this time, the right knee was prepared.  The right knee had the more significant varus alignment.  Leg was elevated, exsanguinated with Esmarch wrap, tourniquet was inflated.  Median and parapatellar  approach was made. Precise location marked with #1 Vicryl suture proximally, had fat pad partially excised.  Soft tissue elevation performed on the medial aspect of the knee because of the significant varus component.  At this time, soft tissue elevated off the distal anterior femur, lateral patellofemoral ligament was released.  Intramedullary alignment was then used on the tibia and 2 mm was resected off the most affected tibial side.  This gave about an 18-19 mm resection __________ more affected lateral side.  The intramedullary alignment was then used to __________ rod for the distal femoral cut in 5 degrees of valgus.  An 8 mm resection was made and a 13-16 spacer gave full extension after this cut.  The femur was sized to a size 6 and the posterior and anterior and chamfer cuts were made.  Tibia was keel punched and it was also a size 7.  Patella was prepared 3 pegged fashion and a 38 trial patella was placed.  Reduction with the trial components was performed with both a 13 and 16 spacer.  A 16 spacer did give full extension, excellent stability varus valgus stress at 0, 30 and 90 degrees of flexion.  The patella tracked very well.  Trial components were removed.  Thorough irrigation performed with 3 L of irrigating solution.  Exparel used to anesthetize the capsule, true components cemented into position.  Excess cement removed.  Same stability parameters were maintained.  Tourniquet released, bleeding points controlled with electrocautery.  Arthrotomy was closed using #1 Vicryl suture, 0 Vicryl suture, 2-0 Vicryl suture and skin staples.  Clonidine and morphine injected into the knee on each incision.  Aquasol dressing was used along with an Ace wrap.  The patient tolerated the procedure well without immediate complication. Transferred to recovery in stable condition.  __________ assistance required at all times during the case for opening, closing, limb positioning.  His  assistance was a medical necessity.     Burnard BuntingG. Scott Mariadel Mruk, M.D.     GSD/MEDQ  D:  06/04/2014  T:  06/04/2014  Job:  [id number]

## 2014-06-05 NOTE — Progress Notes (Signed)
06/05/13 [facility].Spoke with patient and wife,no change in discharge plans. [employer] delivered two CPMs to home. Patient already had a rolling walker and 3N1.[facility] Corrie [facility]Mary [facility], [facility].

## 2014-06-05 NOTE — Progress Notes (Signed)
Subjective: Patient stable pain controlled   Objective: Vital signs in last 24 hours: Temp:  [97.4 F (36.3 C)-98.4 F (36.9 C)] 97.6 F (36.4 C) (01/20 0457) Pulse Rate:  [47-90] 72 (01/20 0457) Resp:  [5-24] 18 (01/20 0457) BP: (121-159)/(65-94) 121/77 mmHg (01/20 0457) SpO2:  [87 %-100 %] 94 % (01/20 0457)  Intake/Output from previous day: 01/19 0701 - 01/20 0700 In: 3850 [P.O.:150; I.V.:3550; IV Piggyback:150] Out: 1800 [Urine:1650; Blood:150] Intake/Output this shift:    Exam:  No cellulitis present Compartment soft Both feet perfused mobile and sensate pulses are diminished on the left compared to the right consistent with his preop exam  Labs:  Recent Labs  06/05/14 0539  HGB 12.9*    Recent Labs  06/05/14 0539  WBC 11.5*  RBC 3.99*  HCT 38.4*  PLT 187    Recent Labs  06/05/14 0539  NA 141  K 4.8  CL 104  CO2 28  BUN 14  CREATININE 1.12  GLUCOSE 127*  CALCIUM 8.5    Recent Labs  06/05/14 0539  INR 1.29    Assessment/Plan: Plan for mobilization with physical therapy and CPM today needs to be out of bed DC Foley plan on removing the Ace wrap's later tonight will want to leave the Aquasol dressing in place on both knees   Duke Weisensel SCOTT 06/05/2014, 7:19 AM

## 2014-06-05 NOTE — Progress Notes (Signed)
ANTICOAGULATION CONSULT NOTE  Pharmacy Consult for coumadin Indication: VTE prophylaxis  No Known Allergies  Patient Measurements: Height: 5\' 11"  (180.3 cm) Weight: 222 lb (100.699 kg) IBW/kg (Calculated) : 75.3 Heparin Dosing Weight:   Vital Signs: Temp: 97.6 F (36.4 C) (01/20 0457) Temp Source: Oral (01/20 0457) BP: 121/77 mmHg (01/20 0457) Pulse Rate: 71 (01/20 0908)  Labs:  Recent Labs  06/05/14 0539  HGB 12.9*  HCT 38.4*  PLT 187  LABPROT 16.2*  INR 1.29  CREATININE 1.12    Estimated Creatinine Clearance: 83.8 mL/min (by C-G formula based on Cr of 1.12).   Assessment: 64 yo male s/p TKA on coumadin for VTE prophylaxis. INR trending upwards  Goal of Therapy:  INR 2-3 Monitor platelets by anticoagulation protocol: Yes   Plan:  - Repeat Coumadin 5 mg po x1 - INR in am  Thank you. Okey RegalLisa Capricia Serda, PharmD [phone]  06/05/2014,10:39 AM

## 2014-06-05 NOTE — Evaluation (Signed)
Occupational Therapy Evaluation Patient Details Name: Devin Castro MRN: [id number] DOB: Jun 14, 1950 Today's Date: 06/05/2014    History of Present Illness Pt is a 64 y.o. male s/p bil TKA.   Clinical Impression   Pt s/p above. Pt independent with ADLs, PTA. Feel pt will benefit from acute OT to increase independence with BADLs, prior to d/c.    Follow Up Recommendations  No OT follow up;Supervision - Intermittent (for mobility/OOB)    Equipment Recommendations  3 in 1 bedside comode;Other (comment) (AE)    Recommendations for Other Services       Precautions / Restrictions Precautions Precautions: Knee;Fall Precaution Booklet Issued: No Precaution Comments: educated on precautions Required Braces or Orthoses: Knee Immobilizer - Right;Knee Immobilizer - Left;Other Brace/Splint Other Brace/Splint: foam blocks when not in CPM and in bed Restrictions Weight Bearing Restrictions: Yes RLE Weight Bearing: Weight bearing as tolerated LLE Weight Bearing: Weight bearing as tolerated      Mobility Bed Mobility Overal bed mobility: Needs Assistance Bed Mobility: Supine to Sit;Sit to Supine     Supine to sit: Min assist Sit to supine: Min assist   General bed mobility comments: assist to support LE's as pt came to EOB and assist with Rt LE as pt going back to supine position.   Transfers Overall transfer level: Needs assistance Equipment used: Rolling walker (2 wheeled) Transfers: Sit to/from [employer] Sit to Stand: Min assist Stand pivot transfers: Min assist       General transfer comment: cues for technique.          ADL Overall ADL's : Needs assistance/impaired                     Lower Body Dressing: With adaptive equipment;Sit to/from stand;Moderate assistance   Toilet Transfer: Minimal assistance;Stand-pivot;BSC;RW           Functional mobility during ADLs: Minimal assistance;Rolling walker (stand pivot) General ADL Comments:  Educated on safety such as safe shoewear, use of bag on walker, sitting for LB ADLs. Educated on AE/cost/where he could purchase. Pt donned underwear with reacher. Educated on LB dressing technique as his right knee seems worse than left knee. Educated on 3 in 1 as well as shower/tub transfer techniques. Discussed options for shower chair. explained benefit of reaching down to feet for LB ADLs as it allows knees to bend. Educated on knee immobilizers and purpose of footsie roll.     Vision                     Perception     Praxis      Pertinent Vitals/Pain Pain Assessment: 0-10 Pain Score: 3  Pain Location: bilateral knees Pain Descriptors / Indicators: Aching Pain Intervention(s): Limited activity within patient's tolerance;Repositioned;Monitored during session     Hand Dominance Right   Extremity/Trunk Assessment Upper Extremity Assessment Upper Extremity Assessment: Overall WFL for tasks assessed   Lower Extremity Assessment Lower Extremity Assessment: Defer to PT evaluation RLE Deficits / Details: 5 to 45 degreens in supine; limited by pain and ACE wrap RLE: Unable to fully assess due to pain RLE Coordination: decreased gross motor LLE Deficits / Details: 3 to 55 degrees in supine; limited by pain and ACE wrap  LLE: Unable to fully assess due to pain LLE Coordination: decreased gross motor   Cervical / Trunk Assessment Cervical / Trunk Assessment: Normal   Communication Communication Communication: No difficulties   Cognition Arousal/Alertness: Awake/alert Behavior During Therapy: [facility]  for tasks assessed/performed Overall Cognitive Status: Within Functional Limits for tasks assessed                     General Comments          Shoulder Instructions      Home Living Family/patient expects to be discharged to:: Private residence Living Arrangements: Spouse/significant other Available Help at Discharge: Family;Available 24 hours/day Type of  Home: House Home Access: Stairs to enter [employer] CorporationEntrance Stairs-Number of Steps: 2 Entrance Stairs-Rails: Right Home Layout: One level     Bathroom Shower/Tub: Walk-in shower;Tub only   Bathroom Toilet: Handicapped height     Home Equipment: Walker - 2 wheels   Additional Comments: pt has walk in shower      Prior Functioning/Environment Level of Independence: Independent             OT Diagnosis: Acute pain   OT Problem List: Decreased strength;Decreased range of motion;Decreased knowledge of precautions;Decreased knowledge of use of DME or AE;Decreased activity tolerance;Pain;Impaired balance (sitting and/or standing)   OT Treatment/Interventions: Self-care/ADL training;DME and/or AE instruction;Therapeutic activities;Patient/family education;Balance training    OT Goals(Current goals can be found in the care plan section) Acute Rehab OT Goals Patient Stated Goal: not stated OT Goal Formulation: With patient Time For Goal Achievement: 06/12/14 Potential to Achieve Goals: Good ADL Goals Pt Will Perform Lower Body Dressing: with set-up;with supervision;with adaptive equipment;sit to/from stand Pt Will Transfer to Toilet: with supervision;ambulating (3 in 1 over commode) Pt Will Perform Toileting - Clothing Manipulation and hygiene: with supervision;sit to/from stand Pt Will Perform Tub/Shower Transfer: Shower transfer;ambulating;rolling walker;3 in 1  OT Frequency: Min 2X/week   Barriers to D/C:            Co-evaluation              End of Session Equipment Utilized During Treatment: Gait belt;Rolling walker;Right knee immobilizer;Left knee immobilizer CPM Left Knee CPM Left Knee: Off CPM Right Knee CPM Right Knee: Off  Activity Tolerance: Other (comment);Patient limited by pain (nausea) Patient left: in bed;with call bell/phone within reach;with family/visitor present   Time: [id number] OT Time Calculation (min): 26 min Charges:  OT General Charges $OT  Visit: 1 Procedure OT Evaluation $Initial OT Evaluation Tier I: 1 Procedure OT Treatments $Self Care/Home Management : 8-22 mins G-CodesEarlie Castro:    Devin Castro L OTR/L [phone] 06/05/2014, 2:17 PM

## 2014-06-05 NOTE — Discharge Instructions (Signed)

## 2014-06-06 ENCOUNTER — Encounter (HOSPITAL_COMMUNITY): Payer: Self-pay | Admitting: General Practice

## 2014-06-06 LAB — CBC
HCT: 35.9 % — ABNORMAL LOW (ref 39.0–52.0)
Hemoglobin: 11.9 g/dL — ABNORMAL LOW (ref 13.0–17.0)
MCH: 32.2 pg (ref 26.0–34.0)
MCHC: 33.1 g/dL (ref 30.0–36.0)
MCV: 97 fL (ref 78.0–100.0)
PLATELETS: 155 10*3/uL (ref 150–400)
RBC: 3.7 MIL/uL — AB (ref 4.22–5.81)
RDW: 14.2 % (ref 11.5–15.5)
WBC: 8.8 10*3/uL (ref 4.0–10.5)

## 2014-06-06 LAB — PROTIME-INR
INR: 2.43 — ABNORMAL HIGH (ref 0.00–1.49)
Prothrombin Time: 26.6 seconds — ABNORMAL HIGH (ref 11.6–15.2)

## 2014-06-06 MED ORDER — OXYCODONE HCL ER 10 MG PO T12A: 10.0000 mg | EXTENDED_RELEASE_TABLET | Freq: Two times a day (BID) | ORAL | Status: DC

## 2014-06-06 MED ORDER — WARFARIN SODIUM 5 MG PO TABS: 5.0000 mg | ORAL_TABLET | Freq: Every day | ORAL | Status: DC

## 2014-06-06 MED ORDER — OXYCODONE HCL 5 MG PO TABS: 5.0000 mg | ORAL_TABLET | ORAL | Status: DC | PRN

## 2014-06-06 MED ORDER — METHOCARBAMOL 500 MG PO TABS: 500.0000 mg | ORAL_TABLET | Freq: Four times a day (QID) | ORAL | Status: DC | PRN

## 2014-06-06 NOTE — Progress Notes (Signed)
Physical Therapy Treatment Patient Details Name: Devin Castro MRN: [id number] DOB: 05-19-1950 Today's Date: 06/06/2014    History of Present Illness Pt is a 64 y.o. male s/p bil TKA.    PT Comments    Pt able to ambulate in hallway today. Attempted to stand without KI's but pt unable to at this time. Will cont to follow per POC. Pt hopeful to D/C home Sunday.   Follow Up Recommendations  Home health PT;Supervision/Assistance - 24 hour     Equipment Recommendations  None recommended by PT    Recommendations for Other Services       Precautions / Restrictions Precautions Precautions: Knee;Fall Precaution Booklet Issued: No Precaution Comments: educated on precautions Required Braces or Orthoses: Knee Immobilizer - Right;Knee Immobilizer - Left;Other Brace/Splint Other Brace/Splint: foam blocks when not in CPM and in bed Restrictions Weight Bearing Restrictions: Yes RLE Weight Bearing: Weight bearing as tolerated LLE Weight Bearing: Weight bearing as tolerated    Mobility  Bed Mobility Overal bed mobility: Needs Assistance Bed Mobility: Supine to Sit;Sit to Supine     Supine to sit: Modified independent (Device/Increase time);HOB elevated Sit to supine: Min assist   General bed mobility comments: pt able to bring LEs off EOB but requires (A) to bring LEs back into bed   Transfers Overall transfer level: Needs assistance Equipment used: Rolling walker (2 wheeled) Transfers: Sit to/from Stand Sit to Stand: Min assist;From elevated surface         General transfer comment: pt requiring bil KI to stand; cues for hand placement and sequencing; bed elevated to simulate home bed height   Ambulation/Gait Ambulation/Gait assistance: Min assist;+2 safety/equipment Ambulation Distance (Feet): 16 Feet Assistive device: Rolling walker (2 wheeled) Gait Pattern/deviations: Step-through pattern;Decreased stride length;Shuffle;Wide base of support Gait velocity: decr Gait  velocity interpretation: Below normal speed for age/gender General Gait Details: pt relying heavily on UEs to support himself due to pain with WB'ing through LEs; pt fatigues quickly but very motivated; cues for gt sequencing and RW management; 2nd person helpful to follow with chair    Stairs            Wheelchair Mobility    Modified Rankin (Stroke Patients Only)       Balance Overall balance assessment: Needs assistance Sitting-balance support: Feet supported;No upper extremity supported Sitting balance-Leahy Scale: Good     Standing balance support: During functional activity;Bilateral upper extremity supported Standing balance-Leahy Scale: Poor Standing balance comment: RW to balance                     Cognition Arousal/Alertness: Awake/alert Behavior During Therapy: WFL for tasks assessed/performed Overall Cognitive Status: Within Functional Limits for tasks assessed                      Exercises Total Joint Exercises Ankle Circles/Pumps: AROM;Both;10 reps;Supine Quad Sets: AROM;Both;10 reps Heel Slides: AAROM;Both;10 reps;Seated (with sheet to (A) LEs) Hip ABduction/ADduction: AAROM;Both;10 reps;Supine Goniometric ROM: Rt LE 5 to 50 degrees limited by pain ; Lt 5 to 65 degrees     General Comments        Pertinent Vitals/Pain Pain Assessment: 0-10 Pain Score: 5  Pain Location: when weightbearing primarily  Pain Descriptors / Indicators: Aching;Sore Pain Intervention(s): Monitored during session;Premedicated before session;Repositioned;Ice applied    Home Living                      Prior Function  PT Goals (current goals can now be found in the care plan section) Acute Rehab PT Goals Patient Stated Goal: to go home sunday PT Goal Formulation: With patient Time For Goal Achievement: 06/12/14 Potential to Achieve Goals: Good Progress towards PT goals: Progressing toward goals    Frequency  7X/week    PT  Plan Current plan remains appropriate    Co-evaluation             End of Session Equipment Utilized During Treatment: Gait belt;Left knee immobilizer;Right knee immobilizer Activity Tolerance: Patient tolerated treatment well Patient left: in chair;with call bell/phone within reach;with family/visitor present     Time: 1610-9604 PT Time Calculation (min) (ACUTE ONLY): 24 min  Charges:  $Gait Training: 8-22 mins $Therapeutic Exercise: 8-22 mins                    G CodesDonnamarie Poag [place], [place]  [phone] 06/06/2014, 11:19 AM

## 2014-06-06 NOTE — Progress Notes (Signed)
Subjective: Pt stable - ok with pain   Objective: Vital signs in last 24 hours: Temp:  [98 F (36.7 C)-98.2 F (36.8 C)] 98.2 F (36.8 C) (01/21 0513) Pulse Rate:  [66-74] 74 (01/21 0513) Resp:  [15-18] 18 (01/21 0513) BP: (106-128)/(58-76) 128/76 mmHg (01/21 0513) SpO2:  [91 %-95 %] 91 % (01/21 0513)  Intake/Output from previous day: 01/20 0701 - 01/21 0700 In: 540 [P.O.:540] Out: 700 [Urine:700] Intake/Output this shift:    Exam:  Sensation intact distally Dorsiflexion/Plantar flexion intact No cellulitis present Compartment soft  Labs:  Recent Labs  06/05/14 0539 06/06/14 0625  HGB 12.9* 11.9*    Recent Labs  06/05/14 0539 06/06/14 0625  WBC 11.5* 8.8  RBC 3.99* 3.70*  HCT 38.4* 35.9*  PLT 187 155    Recent Labs  06/05/14 0539  NA 141  K 4.8  CL 104  CO2 28  BUN 14  CREATININE 1.12  GLUCOSE 127*  CALCIUM 8.5    Recent Labs  06/05/14 0539 06/06/14 0625  INR 1.29 2.43*    Assessment/Plan: inr jumped up Dressing changed today Plan dc Sunday - rx on chart   DEAN,GREGORY SCOTT 06/06/2014, 8:14 AM

## 2014-06-06 NOTE — Progress Notes (Signed)
ANTICOAGULATION CONSULT NOTE  Pharmacy Consult for coumadin Indication: VTE prophylaxis  No Known Allergies  Patient Measurements: Height: 5\' 11"  (180.3 cm) Weight: 222 lb (100.699 kg) IBW/kg (Calculated) : 75.3 Heparin Dosing Weight:   Vital Signs: Temp: 98.2 F (36.8 C) (01/21 0513) BP: 128/76 mmHg (01/21 0513) Pulse Rate: 74 (01/21 0513)  Labs:  Recent Labs  06/05/14 0539 06/06/14 0625  HGB 12.9* 11.9*  HCT 38.4* 35.9*  PLT 187 155  LABPROT 16.2* 26.6*  INR 1.29 2.43*  CREATININE 1.12  --     Estimated Creatinine Clearance: 83.8 mL/min (by C-G formula based on Cr of 1.12).   Assessment: 64 yo male s/p TKA on coumadin for VTE prophylaxis. INR up to 2.43 (large increase)  Goal of Therapy:  INR 2-3 Monitor platelets by anticoagulation protocol: Yes   Plan:  - No Coumadin today - INR in am  Thank you. Okey RegalLisa Maci Eickholt, PharmD [phone]  06/06/2014,9:00 AM

## 2014-06-06 NOTE — Progress Notes (Signed)
Orthopedic Tech Progress Note Patient Details:  [name] February 22, 1951 [id number] Patient placed in CPM machines, bilaterally. Machines adjusted to 50 degrees of flexion. CPM Left Knee CPM Left Knee: On Left Knee Flexion (Degrees): 50 Left Knee Extension (Degrees): 0 Additional Comments: trapeze bar patient helper CPM Right Knee CPM Right Knee: On Right Knee Flexion (Degrees): 50 Right Knee Extension (Degrees): 0 Additional Comments: bilateral   [name] 06/06/2014, 3:00 PM

## 2014-06-06 NOTE — Progress Notes (Signed)
Physical Therapy Treatment Patient Details Name: Devin Castro MRN: [id number] DOB: 1951/04/06 Today's Date: 06/06/2014    History of Present Illness Pt is a 64 y.o. male s/p bil TKA.    PT Comments    Pt very motivated to progress ambulation. Pt c/o intermittent lightheadedness, which limited his mobility. Will cont to follow per POC.   Follow Up Recommendations  Home health PT;Supervision/Assistance - 24 hour     Equipment Recommendations  None recommended by PT    Recommendations for Other Services       Precautions / Restrictions Precautions Precautions: Knee;Fall Precaution Booklet Issued: No Precaution Comments: reviewed wear of KI Required Braces or Orthoses: Knee Immobilizer - Right;Knee Immobilizer - Left;Other Brace/Splint Other Brace/Splint: foam blocks when not in CPM and in bed Restrictions Weight Bearing Restrictions: Yes RLE Weight Bearing: Weight bearing as tolerated LLE Weight Bearing: Weight bearing as tolerated    Mobility  Bed Mobility Overal bed mobility: Needs Assistance Bed Mobility: Supine to Sit     Supine to sit: Modified independent (Device/Increase time);HOB elevated Sit to supine: Min assist   General bed mobility comments: use of handrails  Transfers Overall transfer level: Needs assistance Equipment used: Rolling walker (2 wheeled) Transfers: Sit to/from Stand Sit to Stand: Min assist;From elevated surface         General transfer comment: cues for hand placement; requiring KIs due to buckling of LEs   Ambulation/Gait Ambulation/Gait assistance: Min guard;+2 safety/equipment Ambulation Distance (Feet): 30 Feet Assistive device: Rolling walker (2 wheeled) Gait Pattern/deviations: Step-through pattern;Decreased stride length;Shuffle;Wide base of support Gait velocity: decr Gait velocity interpretation: Below normal speed for age/gender General Gait Details: cues for step through gt and safety with RW; 2 standing rest breaks  due to UE fatigue    Stairs            Wheelchair Mobility    Modified Rankin (Stroke Patients Only)       Balance Overall balance assessment: Needs assistance Sitting-balance support: Feet supported;No upper extremity supported Sitting balance-Leahy Scale: Good     Standing balance support: During functional activity;Bilateral upper extremity supported Standing balance-Leahy Scale: Poor Standing balance comment: RW to balance                     Cognition Arousal/Alertness: Awake/alert Behavior During Therapy: WFL for tasks assessed/performed Overall Cognitive Status: Within Functional Limits for tasks assessed                      Exercises Total Joint Exercises Ankle Circles/Pumps: AROM;Both;10 reps;Supine Quad Sets: AROM;Both;10 reps Short Arc Quad: AROM;Both;5 reps;Supine Heel Slides: AAROM;Both;10 reps;Supine Hip ABduction/ADduction: AAROM;Both;10 reps;Supine Goniometric ROM: Rt LE 5 to 50 degrees limited by pain ; Lt 5 to 65 degrees     General Comments        Pertinent Vitals/Pain Pain Assessment: 0-10 Pain Score: 3  Pain Location: bil LES with Wb'ing Pain Descriptors / Indicators: Sore Pain Intervention(s): Monitored during session;Repositioned;Premedicated before session    Home Living Family/patient expects to be discharged to:: Private residence Living Arrangements: Spouse/significant other                  Prior Function            PT Goals (current goals can now be found in the care plan section) Acute Rehab PT Goals Patient Stated Goal: to go home sunday PT Goal Formulation: With patient Time For Goal Achievement: 06/12/14 Potential to Achieve  Goals: Good Progress towards PT goals: Progressing toward goals    Frequency  7X/week    PT Plan Current plan remains appropriate    Co-evaluation             End of Session Equipment Utilized During Treatment: Gait belt;Left knee immobilizer;Right knee  immobilizer Activity Tolerance: Patient tolerated treatment well Patient left: in chair;with call bell/phone within reach;with family/visitor present     Time: 1350-1413 PT Time Calculation (min) (ACUTE ONLY): 23 min  Charges:  $Gait Training: 8-22 mins $Therapeutic Exercise: 8-22 mins                    G CodesDonell Sievert:      Elverna Caffee N, [place]PT  [phone] 06/06/2014, 2:51 PM

## 2014-06-06 NOTE — Progress Notes (Signed)
OT Cancellation Note  Patient Details Name: Pecola LeisureJames E Capshaw MRN: [id number] DOB: 06-Jan-1951   Cancelled Treatment:    Reason Eval/Treat Not Completed: Other (comment) Pt just positioned into B CPM. Will return in am. Va Eastern Colorado Healthcare SystemWARD,HILLARY  Alix Lahmann, OTR/L  [phone] 06/06/2014 06/06/2014, 3:22 PM

## 2014-06-07 LAB — CBC
HCT: 35.6 % — ABNORMAL LOW (ref 39.0–52.0)
Hemoglobin: 11.6 g/dL — ABNORMAL LOW (ref 13.0–17.0)
MCH: 32 pg (ref 26.0–34.0)
MCHC: 32.6 g/dL (ref 30.0–36.0)
MCV: 98.1 fL (ref 78.0–100.0)
Platelets: 179 10*3/uL (ref 150–400)
RBC: 3.63 MIL/uL — AB (ref 4.22–5.81)
RDW: 14 % (ref 11.5–15.5)
WBC: 8.3 10*3/uL (ref 4.0–10.5)

## 2014-06-07 LAB — PROTIME-INR
INR: 2.35 — AB (ref 0.00–1.49)
Prothrombin Time: 25.9 seconds — ABNORMAL HIGH (ref 11.6–15.2)

## 2014-06-07 MED ORDER — MAGNESIUM CITRATE PO SOLN
0.5000 | Freq: Once | ORAL | Status: AC
Start: 2014-06-07 — End: 2014-06-07
  Administered 2014-06-07: 0.5 via ORAL
  Filled 2014-06-07: qty 296

## 2014-06-07 MED ORDER — BISACODYL 10 MG RE SUPP
10.0000 mg | Freq: Once | RECTAL | Status: AC
  Administered 2014-06-07: 10 mg via RECTAL
  Filled 2014-06-07: qty 1

## 2014-06-07 MED ORDER — WARFARIN SODIUM 2.5 MG PO TABS
2.5000 mg | ORAL_TABLET | Freq: Once | ORAL | Status: AC
  Administered 2014-06-07: 2.5 mg via ORAL
  Filled 2014-06-07: qty 1

## 2014-06-07 MED ORDER — DOCUSATE SODIUM 100 MG PO CAPS
100.0000 mg | ORAL_CAPSULE | Freq: Two times a day (BID) | ORAL | Status: DC
  Administered 2014-06-07 – 2014-06-09 (×5): 100 mg via ORAL
  Filled 2014-06-07 (×6): qty 1

## 2014-06-07 NOTE — Progress Notes (Signed)
Physical Therapy Treatment Patient Details Name: Devin Castro MRN: [id number] DOB: Jun 05, 1950 Today's Date: 06/07/2014    History of Present Illness Pt is a 64 y.o. male s/p bil TKA.    PT Comments    Pt very pleasant and motivated. Performing HEP 2-3 times daily. Patient needs to practice stairs next session.     Follow Up Recommendations  Home health PT;Supervision/Assistance - 24 hour     Equipment Recommendations  None recommended by PT    Recommendations for Other Services       Precautions / Restrictions Precautions Precautions: Knee;Fall Precaution Booklet Issued: No Precaution Comments: reviewed wear of KI Required Braces or Orthoses: Knee Immobilizer - Right;Knee Immobilizer - Left;Other Brace/Splint Other Brace/Splint: stood at end of session without KI and no buckling noted with pre gt Restrictions Weight Bearing Restrictions: Yes RLE Weight Bearing: Weight bearing as tolerated LLE Weight Bearing: Weight bearing as tolerated    Mobility  Bed Mobility Overal bed mobility: Modified Independent Bed Mobility: Supine to Sit     Supine to sit: Modified independent (Device/Increase time);HOB elevated     General bed mobility comments: up in chair and returned to chair   Transfers Overall transfer level: Needs assistance Equipment used: Rolling walker (2 wheeled) Transfers: Sit to/from Stand Sit to Stand: Min guard;From elevated surface         General transfer comment: pt performed sit to stand from chair once with KI and once without; min guard to steady when transitioning UEs ; no buckling noted when transitioning without KIs  Ambulation/Gait Ambulation/Gait assistance: Min guard Ambulation Distance (Feet): 85 Feet Assistive device: Rolling walker (2 wheeled) Gait Pattern/deviations: Step-through pattern;Decreased stride length;Wide base of support Gait velocity: decr Gait velocity interpretation: Below normal speed for age/gender General Gait  Details: pt circumucting LEs at times to clear feet due to KIs; cues for gt sequencing and heel strike; will attempt ambulating next session without KIs to pt tolerance    Stairs            Wheelchair Mobility    Modified Rankin (Stroke Patients Only)       Balance Overall balance assessment: Needs assistance Sitting-balance support: Feet supported;No upper extremity supported Sitting balance-Leahy Scale: Good     Standing balance support: During functional activity;Bilateral upper extremity supported;No upper extremity supported Standing balance-Leahy Scale: Fair Standing balance comment: stood without RW for brief time                      Cognition Arousal/Alertness: Awake/alert Behavior During Therapy: WFL for tasks assessed/performed Overall Cognitive Status: Within Functional Limits for tasks assessed                      Exercises Total Joint Exercises Ankle Circles/Pumps: AROM;Both;10 reps;Seated Quad Sets: AROM;Both;10 reps Heel Slides: AROM;Both;10 reps;Supine Long Arc Quad: AROM;Both;10 reps;Seated Knee Flexion: AROM;AAROM;Both;10 reps;Seated Goniometric ROM: AROM in Rt LE 5 to 50; Lt 5 to 65  Marching in Standing: AROM;Both;10 reps;Standing (with UE support )    General Comments General comments (skin integrity, edema, etc.): reviewed HEP with pt       Pertinent Vitals/Pain Pain Assessment: 0-10 Pain Score: 3  Pain Location: bil LEs  Pain Descriptors / Indicators: Sore Pain Intervention(s): Monitored during session;Premedicated before session;Repositioned;Ice applied    Home Living                      Prior Function  PT Goals (current goals can now be found in the care plan section) Acute Rehab PT Goals Patient Stated Goal: to go home sunday PT Goal Formulation: With patient Time For Goal Achievement: 06/12/14 Potential to Achieve Goals: Good Progress towards PT goals: Progressing toward goals     Frequency  7X/week    PT Plan Current plan remains appropriate    Co-evaluation             End of Session Equipment Utilized During Treatment: Gait belt;Left knee immobilizer;Right knee immobilizer Activity Tolerance: Patient tolerated treatment well Patient left: in chair;with call bell/phone within reach     Time: 1130-1154 PT Time Calculation (min) (ACUTE ONLY): 24 min  Charges:  $Gait Training: 8-22 mins $Therapeutic Exercise: 8-22 mins                    G CodesDonell Sievert, [place]  [phone] 06/07/2014, 12:03 PM

## 2014-06-07 NOTE — Progress Notes (Signed)
Subjective: Doing well.  Pain controlled.  No BM x 3-4 days.  Positive flatus.     Objective: Vital signs in last 24 hours: Temp:  [98.1 F (36.7 C)-98.4 F (36.9 C)] 98.1 F (36.7 C) (01/22 0602) Pulse Rate:  [79-94] 79 (01/22 0602) Resp:  [16-18] 18 (01/22 0800) BP: (91-130)/(64-79) 130/79 mmHg (01/22 0602) SpO2:  [92 %-98 %] 95 % (01/22 0819)  Intake/Output from previous day: 01/21 0701 - 01/22 0700 In: 240 [P.O.:240] Out: 400 [Urine:400] Intake/Output this shift: Total I/O In: 240 [P.O.:240] Out: -    Recent Labs  06/05/14 0539 06/06/14 0625 06/07/14 0452  HGB 12.9* 11.9* 11.6*    Recent Labs  06/06/14 0625 06/07/14 0452  WBC 8.8 8.3  RBC 3.70* 3.63*  HCT 35.9* 35.6*  PLT 155 179    Recent Labs  06/05/14 0539  NA 141  K 4.8  CL 104  CO2 28  BUN 14  CREATININE 1.12  GLUCOSE 127*  CALCIUM 8.5    Recent Labs  06/06/14 0625 06/07/14 0452  INR 2.43* 2.35*    Exam:  aquacel dressings intact bilat knees.  bilat calves nontender, NVI.  No signs of infection.    Assessment/Plan: meds to help move bowels.  Anticipate d/c home Sunday as discussed with Dr August Saucerean.  Continue present care.     Devin Castro,Devin Castro 06/07/2014, 10:20 AM

## 2014-06-07 NOTE — Progress Notes (Signed)
Occupational Therapy Treatment Patient Details Name: Devin Castro MRN: [id number] DOB: 1950-12-31 Today's Date: 06/07/2014    History of present illness Pt is a 64 y.o. male s/p bil TKA.   OT comments  Pt seen today for ADLs and functional mobility. Provided education on donning/doffing KIs and on LB ADLs. Pt able to stand at sink with min guard for ~ 5 minutes for grooming tasks without UE support. Pt will continue to benefit from acute OT.    Follow Up Recommendations  No OT follow up;Supervision - Intermittent    Equipment Recommendations  3 in 1 bedside comode;Other (comment)    Recommendations for Other Services      Precautions / Restrictions Precautions Precautions: Knee;Fall Precaution Booklet Issued: No Precaution Comments: reviewed donning/doffing KI and how to direct family to assist Required Braces or Orthoses: Knee Immobilizer - Right;Knee Immobilizer - Left;Other Brace/Splint Other Brace/Splint: stood at end of session without KI and no buckling noted with pre gt Restrictions Weight Bearing Restrictions: Yes RLE Weight Bearing: Weight bearing as tolerated LLE Weight Bearing: Weight bearing as tolerated       Mobility Bed Mobility Overal bed mobility: Modified Independent Bed Mobility: Supine to Sit     Supine to sit: Modified independent (Device/Increase time);HOB elevated     General bed mobility comments: up in chair and returned to chair   Transfers Overall transfer level: Needs assistance Equipment used: Rolling walker (2 wheeled) Transfers: Sit to/from Stand Sit to Stand: Min guard;From elevated surface         General transfer comment: Min guard both with and without KI. No buckling noted without KI. Min guard to steady and for safety.     Balance Overall balance assessment: Needs assistance Sitting-balance support: Feet supported;No upper extremity supported Sitting balance-Leahy Scale: Good     Standing balance support: During  functional activity;Bilateral upper extremity supported;No upper extremity supported Standing balance-Leahy Scale: Fair Standing balance comment: stood without RW for brief time                     ADL Overall ADL's : Needs assistance/impaired     Grooming: Supervision/safety;Standing               Lower Body Dressing: Minimal assistance;With adaptive equipment;Sit to/from stand   Toilet Transfer: Min guard;Ambulation;BSC;RW Toilet Transfer Details (indicate cue type and reason): min guard for sit<>stand Toileting- Clothing Manipulation and Hygiene: Min guard;Sit to/from stand       Functional mobility during ADLs: Min guard;Rolling walker General ADL Comments: Educated pt on donning/doffing KI and how to direct family/caregivers in assisting. Pt ambulated to sink with min guard and performed oral care with no UE support. After ambulating to recliner, RN came in and asked to administer stool softeners/laxatives and pt requested to return to [facility] to take these medications. Pt requested to stand without KIs and was able to stand without buckling of either LE. Pt took small steady steps to bathroom without KI per his request and sat on toilet. Educated pt on increased time for all ADLs and fatiguing quickly. Educated in energy conservation.                 Cognition  Arousal/Alertness: Awake/Alert Behavior During Therapy: WFL for tasks assessed/performed Overall Cognitive Status: Within Functional Limits for tasks assessed  Pertinent Vitals/ Pain       Pain Assessment: No/denies pain Pain Score: 3  Pain Location: bil LEs  Pain Descriptors / Indicators: Sore Pain Intervention(s): Monitored during session;Premedicated before session;Repositioned;Ice applied         Frequency Min 2X/week     Progress Toward Goals  OT Goals(current goals can now be found in the care plan section)  Progress towards OT goals:  Progressing toward goals  Acute Rehab OT Goals Patient Stated Goal: to go home sunday OT Goal Formulation: With patient Time For Goal Achievement: 06/12/14 Potential to Achieve Goals: Good  Plan Discharge plan remains appropriate       End of Session Equipment Utilized During Treatment: Gait belt;Rolling walker;Right knee immobilizer;Left knee immobilizer   Activity Tolerance Patient tolerated treatment well   Patient Left Other (comment);with nursing/sitter in room   Nurse Communication          Time: [phone] OT Time Calculation (min): 21 min  Charges: OT General Charges $OT Visit: 1 Procedure OT Treatments $Self Care/Home Management : 8-22 mins  Rae LipsMiller, Rivkah Wolz M 06/07/2014, 12:29 PM  Carney LivingLeeAnn Marie Quill Grinder, OTR/L Occupational Therapist [phone] (pager)

## 2014-06-07 NOTE — Progress Notes (Addendum)
Physical Therapy Treatment Patient Details Name: Devin Castro MRN: [id number] DOB: 07/26/1950 Today's Date: 06/07/2014    History of Present Illness Pt is a 64 y.o. male s/p bil TKA.    PT Comments    Pt very motivated to progress with therapy. continues to require use of KIs. Reviewed HEP with pt.  Will cont to follow per POC.   Follow Up Recommendations  Home health PT;Supervision/Assistance - 24 hour     Equipment Recommendations  None recommended by PT    Recommendations for Other Services       Precautions / Restrictions Precautions Precautions: Knee;Fall Precaution Booklet Issued: No Precaution Comments: reviewed wear of KI Other Brace/Splint: foam blocks when not in CPM and in bed Restrictions Weight Bearing Restrictions: Yes RLE Weight Bearing: Weight bearing as tolerated LLE Weight Bearing: Weight bearing as tolerated    Mobility  Bed Mobility Overal bed mobility: Modified Independent Bed Mobility: Supine to Sit     Supine to sit: Modified independent (Device/Increase time);HOB elevated     General bed mobility comments: use of handrails  Transfers Overall transfer level: Needs assistance Equipment used: Rolling walker (2 wheeled) Transfers: Sit to/from Stand Sit to Stand: Min guard;From elevated surface         General transfer comment: cues for handplacement; min guard to steady when transitioning UEs to RW  Ambulation/Gait Ambulation/Gait assistance: Min assist (2nd person follow with chair ) Ambulation Distance (Feet): 80 Feet Assistive device: Rolling walker (2 wheeled) Gait Pattern/deviations: Step-through pattern;Decreased stride length;Shuffle;Wide base of support Gait velocity: decr Gait velocity interpretation: Below normal speed for age/gender General Gait Details: pt able to stand without using RW for minimal time; cues for safety with RW and min (A) to balance    Stairs            Wheelchair Mobility    Modified Rankin  (Stroke Patients Only)       Balance Overall balance assessment: Needs assistance Sitting-balance support: Feet supported;No upper extremity supported Sitting balance-Leahy Scale: Good     Standing balance support: During functional activity;No upper extremity supported Standing balance-Leahy Scale: Fair Standing balance comment: stood minimal amount of time without UE support                    Cognition Arousal/Alertness: Awake/alert Behavior During Therapy: WFL for tasks assessed/performed Overall Cognitive Status: Within Functional Limits for tasks assessed                      Exercises Total Joint Exercises Ankle Circles/Pumps: AROM;Both;10 reps;Supine Quad Sets: AROM;Both;10 reps Heel Slides: AROM;Both;10 reps;Supine    General Comments General comments (skin integrity, edema, etc.): reviewed HEP with pt       Pertinent Vitals/Pain Pain Assessment: 0-10 Pain Score: 2  Pain Location: Bil LEs Rt LE > Lt LE  Pain Descriptors / Indicators: Sore Pain Intervention(s): Monitored during session;Premedicated before session;Repositioned    Home Living                      Prior Function            PT Goals (current goals can now be found in the care plan section) Acute Rehab PT Goals Patient Stated Goal: to go home sunday PT Goal Formulation: With patient Time For Goal Achievement: 06/12/14 Potential to Achieve Goals: Good Progress towards PT goals: Progressing toward goals    Frequency  7X/week    PT Plan Current  plan remains appropriate    Co-evaluation             End of Session Equipment Utilized During Treatment: Gait belt;Left knee immobilizer;Right knee immobilizer Activity Tolerance: Patient tolerated treatment well Patient left: in chair;with call bell/phone within reach     Time: [id number] PT Time Calculation (min) (ACUTE ONLY): 24 min  Charges:  $Gait Training: 8-22 mins $Therapeutic Exercise: 8-22 mins                     G CodesDonell Castro:      Devin Castro N, [place]PT  [phone] 06/07/2014, 9:50 AM

## 2014-06-07 NOTE — Progress Notes (Signed)
ANTICOAGULATION CONSULT NOTE  Pharmacy Consult for coumadin Indication: VTE prophylaxis  No Known Allergies  Patient Measurements: Height: 5\' 11"  (180.3 cm) Weight: 222 lb (100.699 kg) IBW/kg (Calculated) : 75.3 Heparin Dosing Weight:   Vital Signs: Temp: 98.1 F (36.7 C) (01/22 0602) Temp Source: Oral (01/22 0602) BP: 130/79 mmHg (01/22 0602) Pulse Rate: 79 (01/22 0602)  Labs:  Recent Labs  06/05/14 0539 06/06/14 0625 06/07/14 0452  HGB 12.9* 11.9* 11.6*  HCT 38.4* 35.9* 35.6*  PLT 187 155 179  LABPROT 16.2* 26.6* 25.9*  INR 1.29 2.43* 2.35*  CREATININE 1.12  --   --     Estimated Creatinine Clearance: 83.8 mL/min (by C-G formula based on Cr of 1.12).   Assessment: 64 yo male s/p bilateral TKA on coumadin for VTE prophylaxis. INR leveling off at 2.35 today.  Goal of Therapy:  INR 2-3 Monitor platelets by anticoagulation protocol: Yes   Plan:  Coumadin 2.5mg  PO x 1 tonight. Daily INR  Toys 'R' UsKimberly Yichen Gilardi, Pharm.D., BCPS Clinical Pharmacist Pager [phone] 06/07/2014 10:27 AM

## 2014-06-08 LAB — PROTIME-INR
INR: 2.06 — ABNORMAL HIGH (ref 0.00–1.49)
Prothrombin Time: 23.4 seconds — ABNORMAL HIGH (ref 11.6–15.2)

## 2014-06-08 MED ORDER — WARFARIN SODIUM 5 MG PO TABS
5.0000 mg | ORAL_TABLET | Freq: Once | ORAL | Status: AC
  Administered 2014-06-08: 5 mg via ORAL
  Filled 2014-06-08: qty 1

## 2014-06-08 NOTE — Progress Notes (Signed)
Physical Therapy Treatment Patient Details Name: Devin Castro MRN: [id number] DOB: May 06, 1951 Today's Date: 06/08/2014    History of Present Illness Pt is a 64 y.o. male s/p bil TKA.    PT Comments    Pt was seen for evaluation of his current function and to try steps.  He is managing with one leg brace and dense cues, and is planning for family to be there to get into his house.  Encouraged him to take LLE brace home in case he should need support from it later.  CPM is going home as well.  HHPT to follow up .  Follow Up Recommendations  Home health PT;Supervision/Assistance - 24 hour     Equipment Recommendations  None recommended by PT    Recommendations for Other Services       Precautions / Restrictions Precautions Precautions: Knee;Fall Precaution Booklet Issued: Yes (comment) Precaution Comments: reviewed precautions Required Braces or Orthoses: Knee Immobilizer - Right;Other Brace/Splint (L discontinued) Knee Immobilizer - Right: On when out of bed or walking Knee Immobilizer - Left: Discontinue once straight leg raise with < 10 degree lag Restrictions Weight Bearing Restrictions: Yes RLE Weight Bearing: Weight bearing as tolerated LLE Weight Bearing: Weight bearing as tolerated    Mobility  Bed Mobility Overal bed mobility: Modified Independent             General bed mobility comments: up when PT arrived  Transfers Overall transfer level: Needs assistance Equipment used: Rolling walker (2 wheeled) Transfers: Sit to/from [employer] Sit to Stand: Min guard;From elevated surface Stand pivot transfers: Min assist       General transfer comment: Min guard both with and without KI. No buckling noted without KI. Min guard to steady and for safety.   Ambulation/Gait Ambulation/Gait assistance: Min guard Ambulation Distance (Feet): 100 Feet Assistive device: Rolling walker (2 wheeled) Gait Pattern/deviations: Step-through  pattern;Decreased stride length;Decreased dorsiflexion - right;Decreased dorsiflexion - left;Shuffle;Antalgic;Wide base of support Gait velocity: slow Gait velocity interpretation: Below normal speed for age/gender General Gait Details: limited on hallway with chair close due to pt being unsure of his fatigue with the effort   Stairs            Wheelchair Mobility    Modified Rankin (Stroke Patients Only)       Balance Overall balance assessment: Needs assistance Sitting-balance support: Feet supported;Bilateral upper extremity supported Sitting balance-Leahy Scale: Good     Standing balance support: During functional activity;Bilateral upper extremity supported Standing balance-Leahy Scale: Fair Standing balance comment: walker is controlling pain and balance                    Cognition Arousal/Alertness: Awake/alert Behavior During Therapy: WFL for tasks assessed/performed Overall Cognitive Status: Within Functional Limits for tasks assessed                      Exercises Total Joint Exercises Ankle Circles/Pumps: AROM;Both;10 reps;Seated Quad Sets: AROM;Both;10 reps Gluteal Sets: AROM;Both;10 reps Towel Squeeze: AROM;Both;10 reps Hip ABduction/ADduction: AROM;Both;10 reps Straight Leg Raises: AROM;Both;5 reps;Supine Knee Flexion: AAROM;Both;5 reps    General Comments General comments (skin integrity, edema, etc.): Spent time on handout with pt       Pertinent Vitals/Pain Pain Assessment: 0-10 Pain Score: 1  Pain Intervention(s): Limited activity within patient's tolerance;Monitored during session;Premedicated before session;Repositioned  BP was 145/82, pulse 71 and sat 98% per nsg notes.    Home Living  Prior Function            PT Goals (current goals can now be found in the care plan section) Acute Rehab PT Goals Patient Stated Goal: to go home sunday Progress towards PT goals: Progressing toward  goals    Frequency  7X/week    PT Plan Current plan remains appropriate    Co-evaluation             End of Session Equipment Utilized During Treatment: Right knee immobilizer Activity Tolerance: Patient tolerated treatment well Patient left: in chair;with call bell/phone within reach     Time: [id number] PT Time Calculation (min) (ACUTE ONLY): 45 min  Charges:  $Gait Training: 23-37 mins $Therapeutic Exercise: 8-22 mins                    G Codes:      Ivar DrapeStout, Radford Pease E 06/08/2014, 3:44 PM  Samul Dadauth Valborg Friar, PT MS Acute Rehab Dept. Number: [phone]

## 2014-06-08 NOTE — Progress Notes (Signed)
ANTICOAGULATION CONSULT NOTE  Pharmacy Consult for coumadin Indication: VTE prophylaxis  No Known Allergies  Patient Measurements: Height: 5\' 11"  (180.3 cm) Weight: 222 lb (100.699 kg) IBW/kg (Calculated) : 75.3  Vital Signs: Temp: 98.1 F (36.7 C) (01/23 1356) Temp Source: Oral (01/23 1356) BP: 116/69 mmHg (01/23 1356) Pulse Rate: 60 (01/23 1356)  Labs:  Recent Labs  06/06/14 0625 06/07/14 0452 06/08/14 0442  HGB 11.9* 11.6*  --   HCT 35.9* 35.6*  --   PLT 155 179  --   LABPROT 26.6* 25.9* 23.4*  INR 2.43* 2.35* 2.06*    Estimated Creatinine Clearance: 83.8 mL/min (by C-G formula based on Cr of 1.12).   Assessment: 64 yo male s/p bilateral TKA on coumadin for VTE prophylaxis. INR remains therapeutic but at low end of therapeutic range. Likely will need mix of 2.5mg  and 5mg . CBC ok, no bleeding noted.  Goal of Therapy:  INR 2-3 Monitor platelets by anticoagulation protocol: Yes   Plan:  Coumadin 5 mg PO x 1 tonight. Daily INR  Christoper Fabianaron Gwyn Hieronymus, PharmD, BCPS Clinical pharmacist, pager [phone] 06/08/2014 2:19 PM

## 2014-06-08 NOTE — Progress Notes (Signed)
Patient ID: Devin LeisureJames E Donigan, male   DOB: December 07, 1950, 64 y.o.   MRN: [id number] Doing well overall.  Making great progress.  Both knee dressings are clean and intact.  His calfs are soft.  Plan to discharge tomorrow.

## 2014-06-09 LAB — PROTIME-INR
INR: 2.49 — ABNORMAL HIGH (ref 0.00–1.49)
Prothrombin Time: 27.1 s — ABNORMAL HIGH (ref 11.6–15.2)

## 2014-06-09 NOTE — Progress Notes (Signed)
Occupational Therapy Treatment Patient Details Name: Devin Castro MRN: [id number] DOB: [birth year] Today's Date: 06/09/2014    History of present illness Pt is a 64 y.o. male s/p bil TKA.   OT comments  Pt seen today for ADL session. Pt was able to dress self with Supervision/setup from OT and no use of AE. Pt demonstrates good techniques and shows safety with transfers. No further acute OT needs.    Follow Up Recommendations  No OT follow up;Supervision - Intermittent    Equipment Recommendations    (has been delivered)   Recommendations for Other Services   None    Precautions / Restrictions Precautions Precautions: Knee;Fall Precaution Comments: reviewed precautions Restrictions Weight Bearing Restrictions: Yes RLE Weight Bearing: Weight bearing as tolerated LLE Weight Bearing: Weight bearing as tolerated       Mobility Bed Mobility               General bed mobility comments: Pt sitting in recliner when OT arrived.   Transfers Overall transfer level: Needs assistance Equipment used: Rolling walker (2 wheeled) Transfers: Sit to/from Stand Sit to Stand: Supervision         General transfer comment: Supervision for safety. Good technique and hand placement.     Balance Overall balance assessment: Needs assistance Sitting-balance support: No upper extremity supported;Feet supported Sitting balance-Leahy Scale: Good     Standing balance support: During functional activity Standing balance-Leahy Scale: Fair Standing balance comment: Pt is able to stand without UE support to pull up pants and don shirt with no LOB noted.                    ADL Overall ADL's : Needs assistance/impaired                     Lower Body Dressing: Sit to/from stand;Set up;Supervision/safety Lower Body Dressing Details (indicate cue type and reason): able to don underwear, shorts, and slide on shoes with Supervision.  Toilet Transfer: Supervision/safety;RW    Toileting- Water quality scientist and Hygiene: Supervision/safety;Sit to/from stand       Functional mobility during ADLs: Supervision/safety;Rolling walker General ADL Comments: Pt doing very well. Educated pt on safety and importance of not doing too much.                 Cognition  Arousal/Alertness: Awake/Alert Behavior During Therapy: WFL for tasks assessed/performed Overall Cognitive Status: Within Functional Limits for tasks assessed                                    Pertinent Vitals/ Pain       Pain Assessment: No/denies pain         Frequency       Progress Toward Goals  OT Goals(current goals can now be found in the care plan section)  Progress towards OT goals: Goals met/education completed, patient discharged from [facility] All goals met and education completed, patient discharged from OT services;Discharge plan remains appropriate       End of Session Equipment Utilized During Treatment: Rolling walker   Activity Tolerance Patient tolerated treatment well   Patient Left in chair;with call bell/phone within reach;with nursing/sitter in room   Nurse Communication Other (comment) (pt dressed and ready for d/c from OT standpoint)        Time: 1056-1106 OT Time Calculation (min): 10 min  Charges:  OT General Charges $OT Visit: 1 Procedure OT Treatments $Self Care/Home Management : 8-22 mins  Juluis Rainier 06/09/2014, 11:13 AM   Cyndie Chime, OTR/L Occupational Therapist [phone] (pager)

## 2014-06-09 NOTE — Progress Notes (Signed)
PT Cancellation Note  Patient Details Name: Devin LeisureJames E Justo MRN: [id number] DOB: 09/12/1950   Cancelled Treatment:    Reason Eval/Treat Not Completed: Other (comment)  Politely declining PT today; reports is very confident in his ability to manage at home; no questions re: stair negotiation;   Thanks,  Van ClinesHolly Galan Ghee, PT  Acute Rehabilitation Services Pager [phone] Office [phone]    Van ClinesGarrigan, Wrenley Sayed [facility] LLCamff 06/09/2014, 11:21 AM

## 2014-06-09 NOTE — Discharge Summary (Signed)
Patient ID: Devin Castro MRN: [id number] DOB/AGE: [id number] 62 y.o.  Admit date: 06/04/2014 Discharge date: 06/09/2014  Admission Diagnoses:  Active Problems:   Arthritis of knee   Discharge Diagnoses:  Same  Past Medical History  Diagnosis Date  . Hypertension   . COPD (chronic obstructive pulmonary disease)   . Vertigo   . Kidney stone   . Shortness of breath dyspnea   . Arthritis     Surgeries: Procedure(s): TOTAL KNEE BILATERAL on 06/04/2014   Consultants:    Discharged Condition: Improved  Hospital Course: Devin Castro is an 64 y.o. male who was admitted 06/04/2014 for operative treatment of<principal problem not specified>. Patient has severe unremitting pain that affects sleep, daily activities, and work/hobbies. After pre-op clearance the patient was taken to the operating room on 06/04/2014 and underwent  Procedure(s): TOTAL KNEE BILATERAL.    Patient was given perioperative antibiotics: Anti-infectives    Start     Dose/Rate Route Frequency Ordered Stop   06/04/14 1800  ceFAZolin (ANCEF) IVPB 2 g/50 mL premix     2 g100 mL/hr over 30 Minutes Intravenous 3 times per day 06/04/14 1627 06/05/14 0652   06/04/14 0600  ceFAZolin (ANCEF) IVPB 2 g/50 mL premix     2 g100 mL/hr over 30 Minutes Intravenous On call to O.R. 06/03/14 1254 06/04/14 1115       Patient was given sequential compression devices, early ambulation, and chemoprophylaxis to prevent DVT.  Patient benefited maximally from hospital stay and there were no complications.    Recent vital signs: Patient Vitals for the past 24 hrs:  BP Temp Temp src Pulse Resp SpO2  06/09/14 0458 139/79 mmHg 97.7 F (36.5 C) Oral 74 16 92 %  06/09/14 0000 - - - - 18 94 %  06/08/14 2021 127/78 mmHg 97.4 F (36.3 C) Oral 80 18 93 %  06/08/14 2000 - - - - 18 93 %  06/08/14 1630 - - - - - 93 %  06/08/14 1600 - - - - 18 -  06/08/14 1356 116/69 mmHg 98.1 F (36.7 C) Oral 60 18 97 %  06/08/14 1200 - - - - 16 -      Recent laboratory studies:  Recent Labs  06/07/14 0452 06/08/14 0442 06/09/14 0442  WBC 8.3  --   --   HGB 11.6*  --   --   HCT 35.6*  --   --   PLT 179  --   --   INR 2.35* 2.06* 2.49*     Discharge Medications:     Medication List    STOP taking these medications        erythromycin ophthalmic ointment      TAKE these medications        amLODipine 5 MG tablet  Commonly known as:  NORVASC  Take 5 mg by mouth daily.     atorvastatin 20 MG tablet  Commonly known as:  LIPITOR  Take 20 mg by mouth daily.     losartan 100 MG tablet  Commonly known as:  COZAAR  Take 100 mg by mouth daily.     methocarbamol 500 MG tablet  Commonly known as:  ROBAXIN  Take 1 tablet (500 mg total) by mouth every 6 (six) hours as needed for muscle spasms.     mometasone-formoterol 200-5 MCG/ACT Aero  Commonly known as:  DULERA  Inhale 1 puff into the lungs daily.     nebivolol 10 MG tablet  Commonly known  as:  BYSTOLIC  Take 10 mg by mouth daily.     oxyCODONE 5 MG immediate release tablet  Commonly known as:  Oxy IR/ROXICODONE  Take 1-2 tablets (5-10 mg total) by mouth every 3 (three) hours as needed for breakthrough pain.     OxyCODONE 10 mg T12a 12 hr tablet  Commonly known as:  OXYCONTIN  Take 1 tablet (10 mg total) by mouth every 12 (twelve) hours.     warfarin 5 MG tablet  Commonly known as:  COUMADIN  Take 1 tablet (5 mg total) by mouth daily.        Diagnostic Studies: Dg Chest 2 View  05/24/2014   CLINICAL DATA:  COPD, hypertension  EXAM: CHEST  2 VIEW  COMPARISON:  05/23/2009  FINDINGS: Cardiomediastinal silhouette is stable. No acute infiltrate or pleural effusion. No pulmonary edema. Again noted mild elevation of the right hemidiaphragm. Bony thorax is stable.  IMPRESSION: No active cardiopulmonary disease.  No significant change.   Electronically Signed   By: Natasha MeadLiviu  Pop M.D.   On: 05/24/2014 10:11    Disposition: 01-Home or Self Care      Discharge  Instructions    Call MD / Call 911    Complete by:  As directed   If you experience chest pain or shortness of breath, CALL 911 and be transported to the hospital emergency room.  If you develope a fever above 101 F, pus (white drainage) or increased drainage or redness at the wound, or calf pain, call your surgeon's office.     Constipation Prevention    Complete by:  As directed   Drink plenty of fluids.  Prune juice may be helpful.  You may use a stool softener, such as Colace (over the counter) 100 mg twice a day.  Use MiraLax (over the counter) for constipation as needed.     Diet - low sodium heart healthy    Complete by:  As directed      Discharge instructions    Complete by:  As directed   Weight bearing as tolerated with crutches Ok to remove dressing on Tuesday - can shower at that time - no baths CPM each knee minimum 3 hours per day     Discharge patient    Complete by:  As directed      Increase activity slowly as tolerated    Complete by:  As directed      Weight bearing as tolerated    Complete by:  As directed            Follow-up Information    [facility]   Why:  They will contact you to schedule home nurse and physical therapy visits.    Contact information:   [address] [place] [address] [phone]       Follow up with Cammy CopaEAN,GREGORY SCOTT, MD In 2 weeks.   Specialty:  Orthopedic Surgery   Contact information:   [address] [place] [address] [phone]        Signed: Kathryne HitchBLACKMAN,Syrita Dovel Y 06/09/2014, 9:13 AM

## 2014-06-09 NOTE — Progress Notes (Signed)
Subjective: 5 Days Post-Op Procedure(s) (LRB): TOTAL KNEE BILATERAL (Bilateral) Patient reports pain as mild.    Objective: Vital signs in last 24 hours: Temp:  [97.4 F (36.3 C)-98.1 F (36.7 C)] 97.7 F (36.5 C) (01/24 0458) Pulse Rate:  [60-80] 74 (01/24 0458) Resp:  [16-18] 16 (01/24 0458) BP: (116-139)/(69-79) 139/79 mmHg (01/24 0458) SpO2:  [92 %-97 %] 92 % (01/24 0458)  Intake/Output from previous day: 01/23 0701 - 01/24 0700 In: -  Out: 1600 [Urine:1600] Intake/Output this shift:     Recent Labs  06/07/14 0452  HGB 11.6*    Recent Labs  06/07/14 0452  WBC 8.3  RBC 3.63*  HCT 35.6*  PLT 179   No results for input(s): NA, K, CL, CO2, BUN, CREATININE, GLUCOSE, CALCIUM in the last 72 hours.  Recent Labs  06/08/14 0442 06/09/14 0442  INR 2.06* 2.49*    Sensation intact distally Intact pulses distally Dorsiflexion/Plantar flexion intact Incision: dressing C/D/I No cellulitis present Compartment soft  Assessment/Plan: 5 Days Post-Op Procedure(s) (LRB): TOTAL KNEE BILATERAL (Bilateral)  Discharge to home today  Devin Castro,Devin Castro 06/09/2014, 9:12 AM

## 2014-06-15 ENCOUNTER — Emergency Department (HOSPITAL_COMMUNITY): Payer: Medicare Other

## 2014-06-15 ENCOUNTER — Inpatient Hospital Stay (HOSPITAL_COMMUNITY)
Admission: EM | Admit: 2014-06-15 | Discharge: 2014-06-17 | DRG: 176 | Disposition: A | Discharge status: Home / Self Care | Payer: Medicare Other | Attending: Internal Medicine | Admitting: Internal Medicine

## 2014-06-15 ENCOUNTER — Encounter (HOSPITAL_COMMUNITY): Payer: Self-pay

## 2014-06-15 DIAGNOSIS — E785 Hyperlipidemia, unspecified: Secondary | ICD-10-CM | POA: Diagnosis present

## 2014-06-15 DIAGNOSIS — J449 Chronic obstructive pulmonary disease, unspecified: Secondary | ICD-10-CM | POA: Diagnosis present

## 2014-06-15 DIAGNOSIS — Z87442 Personal history of urinary calculi: Secondary | ICD-10-CM

## 2014-06-15 DIAGNOSIS — J42 Unspecified chronic bronchitis: Secondary | ICD-10-CM

## 2014-06-15 DIAGNOSIS — I2699 Other pulmonary embolism without acute cor pulmonale: Secondary | ICD-10-CM | POA: Diagnosis present

## 2014-06-15 DIAGNOSIS — M1990 Unspecified osteoarthritis, unspecified site: Secondary | ICD-10-CM | POA: Diagnosis present

## 2014-06-15 DIAGNOSIS — I10 Essential (primary) hypertension: Secondary | ICD-10-CM | POA: Diagnosis present

## 2014-06-15 DIAGNOSIS — Z87891 Personal history of nicotine dependence: Secondary | ICD-10-CM

## 2014-06-15 DIAGNOSIS — Z96653 Presence of artificial knee joint, bilateral: Secondary | ICD-10-CM | POA: Diagnosis present

## 2014-06-15 DIAGNOSIS — R0602 Shortness of breath: Secondary | ICD-10-CM | POA: Diagnosis present

## 2014-06-15 DIAGNOSIS — Z7901 Long term (current) use of anticoagulants: Secondary | ICD-10-CM | POA: Diagnosis not present

## 2014-06-15 DIAGNOSIS — J441 Chronic obstructive pulmonary disease with (acute) exacerbation: Secondary | ICD-10-CM | POA: Diagnosis present

## 2014-06-15 LAB — BASIC METABOLIC PANEL
Anion gap: 9 (ref 5–15)
BUN: 21 mg/dL (ref 6–23)
CO2: 20 mmol/L (ref 19–32)
Calcium: 8.8 mg/dL (ref 8.4–10.5)
Chloride: 104 mmol/L (ref 96–112)
Creatinine, Ser: 1.01 mg/dL (ref 0.50–1.35)
GFR calc Af Amer: >90 mL/min (ref >90)
GFR calc non Af Amer: 78 mL/min — ABNORMAL LOW (ref >90)
Glucose, Bld: 96 mg/dL (ref 70–99)
Potassium: 4.6 mmol/L (ref 3.5–5.1)
Sodium: 133 mmol/L — ABNORMAL LOW (ref 135–145)

## 2014-06-15 LAB — CBC WITH DIFFERENTIAL/PLATELET
Basophils Absolute: 0 10*3/uL (ref 0.0–0.1)
Basophils Relative: 1 % (ref 0–1)
Eosinophils Absolute: 0.2 10*3/uL (ref 0.0–0.7)
Eosinophils Relative: 3 % (ref 0–5)
HCT: 35.7 % — ABNORMAL LOW (ref 39.0–52.0)
Hemoglobin: 11.8 g/dL — ABNORMAL LOW (ref 13.0–17.0)
Lymphocytes Relative: 26 % (ref 12–46)
Lymphs Abs: 2.2 10*3/uL (ref 0.7–4.0)
MCH: 31.5 pg (ref 26.0–34.0)
MCHC: 33.1 g/dL (ref 30.0–36.0)
MCV: 95.2 fL (ref 78.0–100.0)
Monocytes Absolute: 0.8 10*3/uL (ref 0.1–1.0)
Monocytes Relative: 9 % (ref 3–12)
Neutro Abs: 5.2 10*3/uL (ref 1.7–7.7)
Neutrophils Relative %: 61 % (ref 43–77)
Platelets: 400 10*3/uL (ref 150–400)
RBC: 3.75 MIL/uL — ABNORMAL LOW (ref 4.22–5.81)
RDW: 14.8 % (ref 11.5–15.5)
WBC: 8.4 10*3/uL (ref 4.0–10.5)

## 2014-06-15 LAB — PROTIME-INR
INR: 2.12 — ABNORMAL HIGH (ref 0.00–1.49)
Prothrombin Time: 23.9 seconds — ABNORMAL HIGH (ref 11.6–15.2)

## 2014-06-15 LAB — BRAIN NATRIURETIC PEPTIDE: B Natriuretic Peptide: 22 pg/mL (ref 0.0–100.0)

## 2014-06-15 LAB — TROPONIN I: Troponin I: <0.03 ng/mL (ref <0.031)

## 2014-06-15 MED ORDER — AMOXICILLIN-POT CLAVULANATE 875-125 MG PO TABS
1.0000 | ORAL_TABLET | Freq: Two times a day (BID) | ORAL | Status: DC
  Administered 2014-06-15 – 2014-06-17 (×5): 1 via ORAL
  Filled 2014-06-15 (×6): qty 1

## 2014-06-15 MED ORDER — ENOXAPARIN SODIUM 100 MG/ML ~~LOC~~ SOLN
100.0000 mg | Freq: Once | SUBCUTANEOUS | Status: AC
Start: 2014-06-16 — End: 2014-06-16
  Administered 2014-06-16: 100 mg via SUBCUTANEOUS
  Filled 2014-06-15 (×2): qty 1

## 2014-06-15 MED ORDER — ENOXAPARIN SODIUM 100 MG/ML ~~LOC~~ SOLN
100.0000 mg | Freq: Once | SUBCUTANEOUS | Status: AC
  Administered 2014-06-16: 100 mg via SUBCUTANEOUS
  Filled 2014-06-15: qty 1

## 2014-06-15 MED ORDER — ATORVASTATIN CALCIUM 20 MG PO TABS
20.0000 mg | ORAL_TABLET | Freq: Every day | ORAL | Status: DC
  Administered 2014-06-15 – 2014-06-16 (×2): 20 mg via ORAL
  Filled 2014-06-15 (×3): qty 1

## 2014-06-15 MED ORDER — SODIUM CHLORIDE 0.9 % IJ SOLN
3.0000 mL | Freq: Two times a day (BID) | INTRAMUSCULAR | Status: DC
  Administered 2014-06-17: 3 mL via INTRAVENOUS

## 2014-06-15 MED ORDER — NEBIVOLOL HCL 10 MG PO TABS
10.0000 mg | ORAL_TABLET | Freq: Every day | ORAL | Status: DC
  Administered 2014-06-15 – 2014-06-17 (×3): 10 mg via ORAL
  Filled 2014-06-15 (×3): qty 1

## 2014-06-15 MED ORDER — AMOXICILLIN 875 MG PO TABS: 875.0000 mg | ORAL_TABLET | Freq: Two times a day (BID) | ORAL | Status: DC

## 2014-06-15 MED ORDER — LOSARTAN POTASSIUM 50 MG PO TABS
100.0000 mg | ORAL_TABLET | Freq: Every day | ORAL | Status: DC
  Administered 2014-06-15 – 2014-06-17 (×3): 100 mg via ORAL
  Filled 2014-06-15 (×3): qty 2

## 2014-06-15 MED ORDER — SODIUM CHLORIDE 0.9 % IJ SOLN
3.0000 mL | Freq: Two times a day (BID) | INTRAMUSCULAR | Status: DC
  Administered 2014-06-15 – 2014-06-17 (×3): 3 mL via INTRAVENOUS

## 2014-06-15 MED ORDER — MOMETASONE FURO-FORMOTEROL FUM 200-5 MCG/ACT IN AERO
1.0000 | INHALATION_SPRAY | Freq: Every day | RESPIRATORY_TRACT | Status: DC
  Administered 2014-06-16 – 2014-06-17 (×2): 1 via RESPIRATORY_TRACT
  Filled 2014-06-15 (×2): qty 8.8

## 2014-06-15 MED ORDER — SODIUM CHLORIDE 0.9 % IJ SOLN: 3.0000 mL | INTRAMUSCULAR | Status: DC | PRN

## 2014-06-15 MED ORDER — ONDANSETRON HCL 4 MG/2ML IJ SOLN: 4.0000 mg | Freq: Four times a day (QID) | INTRAMUSCULAR | Status: DC | PRN

## 2014-06-15 MED ORDER — GUAIFENESIN-DM 100-10 MG/5ML PO SYRP: 5.0000 mL | ORAL_SOLUTION | ORAL | Status: DC | PRN

## 2014-06-15 MED ORDER — OXYCODONE HCL 5 MG PO TABS: 5.0000 mg | ORAL_TABLET | ORAL | Status: DC | PRN

## 2014-06-15 MED ORDER — METHOCARBAMOL 500 MG PO TABS
500.0000 mg | ORAL_TABLET | Freq: Four times a day (QID) | ORAL | Status: DC | PRN
  Administered 2014-06-16: 500 mg via ORAL
  Filled 2014-06-15 (×2): qty 1

## 2014-06-15 MED ORDER — ONDANSETRON HCL 4 MG PO TABS
4.0000 mg | ORAL_TABLET | Freq: Four times a day (QID) | ORAL | Status: DC | PRN
  Administered 2014-06-16: 4 mg via ORAL
  Filled 2014-06-15: qty 1

## 2014-06-15 MED ORDER — ALBUTEROL SULFATE (2.5 MG/3ML) 0.083% IN NEBU
2.5000 mg | INHALATION_SOLUTION | RESPIRATORY_TRACT | Status: DC | PRN
Start: 2014-06-15 — End: 2014-06-17

## 2014-06-15 MED ORDER — AMLODIPINE BESYLATE 5 MG PO TABS
5.0000 mg | ORAL_TABLET | Freq: Every day | ORAL | Status: DC
  Administered 2014-06-15 – 2014-06-17 (×3): 5 mg via ORAL
  Filled 2014-06-15 (×3): qty 1

## 2014-06-15 MED ORDER — ENOXAPARIN SODIUM 100 MG/ML ~~LOC~~ SOLN
100.0000 mg | Freq: Once | SUBCUTANEOUS | Status: AC
  Administered 2014-06-15: 100 mg via SUBCUTANEOUS
  Filled 2014-06-15: qty 1

## 2014-06-15 MED ORDER — SODIUM CHLORIDE 0.9 % IV SOLN: 250.0000 mL | INTRAVENOUS | Status: DC | PRN

## 2014-06-15 MED ORDER — ENOXAPARIN SODIUM 100 MG/ML ~~LOC~~ SOLN
100.0000 mg | Freq: Two times a day (BID) | SUBCUTANEOUS | Status: DC
  Administered 2014-06-17: 100 mg via SUBCUTANEOUS
  Filled 2014-06-15 (×3): qty 1

## 2014-06-15 MED ORDER — OXYCODONE HCL ER 10 MG PO T12A
10.0000 mg | EXTENDED_RELEASE_TABLET | Freq: Two times a day (BID) | ORAL | Status: DC
  Administered 2014-06-17: 10 mg via ORAL
  Filled 2014-06-15 (×3): qty 1

## 2014-06-15 NOTE — H&P (Signed)
PATIENT DETAILS Name: Devin Castro Age: 64 y.o. Sex: male Date of Birth: 08-26-1950 Admit Date: 06/15/2014 ZOX:WRUEAV,[id number]:BADGER,MICHAEL C, MD   CHIEF COMPLAINT:  Shortness of breath for the past 2-3 days  HPI: Devin Castro is a 64 y.o. male with a Past Medical History of COPD, hypertension who was just discharged from this facility after bilateral knee replacement on 06/09/14 who presents today with the above noted complaint. Patient was discharged on Coumadin for VTE prophylaxis, on discharge his INR was therapeutic at 2.49, patient claims that the next day or so, home health RN drew PT/INR and the INR was more than 5. As a result his Coumadin was held for 2 days, and once INR was back between 2-3, Coumadin was resumed at a dose of 2.5 mg a day. During this time patient started developing worsening shortness of breath, he went to his PCPs office where a d-dimer was done that was elevated, this was followed by a CT angiogram of the chest done on 1/29 (at [facility]) which was positive for bilateral pulmonary embolism. Patient was then referred to the emergency room, subsequently I was asked to admit this patient for further evaluation and treatment. Interestingly, prior to the surgery, because of severe arthritis patient was not very mobile, but he did not have shortness of breath or leg swelling prior to his surgery. Patient's wife claims that patient also had bilateral lower extremity Dopplers yesterday as well which was negative  Patient currently appears stable, he denies any chest pain, or any shortness of breath at rest he does note history of nausea, vomiting or diarrhea. Please note, patient has no prior history of DVT or PE.   ALLERGIES:  No Known Allergies  PAST MEDICAL HISTORY: Past Medical History  Diagnosis Date  . Hypertension   . COPD (chronic obstructive pulmonary disease)   . Vertigo   . Kidney stone   . Shortness of breath dyspnea   . Arthritis     PAST SURGICAL  HISTORY: Past Surgical History  Procedure Laterality Date  . Kidney stone removal    . Cardiac catheterization    . Colonoscopy    . Total knee arthroplasty Bilateral 06/04/2014    Procedure: TOTAL KNEE BILATERAL;  Surgeon: Cammy CopaGregory Scott Dean, MD;  [facility];  Service: Orthopedics;  Laterality: Bilateral;    MEDICATIONS AT HOME: Prior to Admission medications   Medication Sig Start Date End Date Taking? Authorizing Provider  amLODipine (NORVASC) 5 MG tablet Take 5 mg by mouth daily.   Yes Historical Provider, MD  amoxicillin (AMOXIL) 875 MG tablet Take 875 mg by mouth 2 (two) times daily. 06/13/14 06/23/14 Yes Historical Provider, MD  atorvastatin (LIPITOR) 20 MG tablet Take 20 mg by mouth daily.   Yes Historical Provider, MD  docusate sodium (COLACE) 100 MG capsule Take 100 mg by mouth 2 (two) times daily.   Yes Historical Provider, MD  losartan (COZAAR) 100 MG tablet Take 100 mg by mouth daily.   Yes Historical Provider, MD  methocarbamol (ROBAXIN) 500 MG tablet Take 1 tablet (500 mg total) by mouth every 6 (six) hours as needed for muscle spasms. 06/06/14  Yes Cammy CopaGregory Scott Dean, MD  Mometasone Furo-Formoterol Fum 200-5 MCG/ACT AERO Inhale 1 puff into the lungs daily.    Yes Historical Provider, MD  nebivolol (BYSTOLIC) 10 MG tablet Take 10 mg by mouth daily.   Yes Historical Provider, MD  oxyCODONE (OXY IR/ROXICODONE) 5 MG immediate release  tablet Take 1-2 tablets (5-10 mg total) by mouth every 3 (three) hours as needed for breakthrough pain. 06/06/14  Yes Cammy Copa, MD  OxyCODONE (OXYCONTIN) 10 mg T12A 12 hr tablet Take 1 tablet (10 mg total) by mouth every 12 (twelve) hours. 06/06/14  Yes Cammy Copa, MD  VENTOLIN HFA 108 (90 BASE) MCG/ACT inhaler Inhale 2 puffs into the lungs every 4 (four) hours as needed. 06/13/14  Yes Historical Provider, MD  warfarin (COUMADIN) 5 MG tablet Take 1 tablet (5 mg total) by mouth daily. Patient taking differently: Take 2.5 mg by mouth  daily.  06/06/14  Yes Cammy Copa, MD    FAMILY HISTORY: No family history on file.  SOCIAL HISTORY:  reports that he quit smoking about 11 years ago. He has quit using smokeless tobacco. He reports that he does not drink alcohol or use illicit drugs.  REVIEW OF SYSTEMS:  Constitutional:   No  weight loss, night sweats,  Fevers, chills, fatigue.  HEENT:    No headaches, Difficulty swallowing,Tooth/dental problems,Sore throat   Cardio-vascular: No chest pain,  Orthopnea, PND, swelling in lower extremities, anasarca, dizziness, palpitations  GI:  No heartburn, indigestion, abdominal pain, nausea, vomiting, diarrhea, change in  bowel habits, loss of appetite  Resp: No shortness of breath  at rest.  No excess mucus, no productive cough, No non-productive cough,  No coughing up of blood.  Skin:  no rash or lesions.  GU:  no dysuria, change in color of urine, no urgency or frequency.  No flank pain.  Musculoskeletal: No joint pain or swelling.  No decreased range of motion.  No back pain.  Psych: No change in mood or affect. No depression or anxiety.  No memory loss.   PHYSICAL EXAM: Blood pressure 139/75, pulse 70, temperature 97.6 F (36.4 C), temperature source Oral, resp. rate 17, height  (1.803 m), weight 101.606 kg (224 lb), SpO2 92 %.  General appearance :Awake, alert, not in any distress. Speech Clear. Not toxic Looking HEENT: Atraumatic and Normocephalic, pupils equally reactive to light and accomodation Neck: supple, no JVD. No cervical lymphadenopathy.  Chest:Good air entry bilaterally, no added sounds  CVS: S1 S2 regular, no murmurs.  Abdomen: Bowel sounds present, Non tender and not distended with no gaurding, rigidity or rebound. Extremities: B/L Lower Ext shows no edema, both legs are warm to touch Neurology: Awake alert, and oriented X 3, CN II-XII intact, Non focal Skin:No Rash Wounds:N/A  LABS ON ADMISSION:   Recent Labs  06/15/14 0834    NA 133*  K 4.6  CL 104  CO2 20  GLUCOSE 96  BUN 21  CREATININE 1.01  CALCIUM 8.8   No results for input(s): AST, ALT, ALKPHOS, BILITOT, PROT, ALBUMIN in the last 72 hours. No results for input(s): LIPASE, AMYLASE in the last 72 hours.  Recent Labs  06/15/14 0834  WBC 8.4  NEUTROABS 5.2  HGB 11.8*  HCT 35.7*  MCV 95.2  PLT 400    Recent Labs  06/15/14 0834  TROPONINI <0.03   No results for input(s): DDIMER in the last 72 hours. Invalid input(s): POCBNP   RADIOLOGIC STUDIES ON ADMISSION: Dg Chest 2 View  06/15/2014   CLINICAL DATA:  Shortness of breath since knee replacement surgery one week ago.  EXAM: CHEST  2 VIEW  COMPARISON:  May 24, 2014.  FINDINGS: The heart size and mediastinal contours are within normal limits. Both lungs are clear. No pneumothorax or pleural effusion is  noted. The visualized skeletal structures are unremarkable.  IMPRESSION: No acute cardiopulmonary abnormality seen.   Electronically Signed   By: Roque Lias M.D.   On: 06/15/2014 10:55     EKG: Independently reviewed. Normal sinus rhythm  ASSESSMENT AND PLAN: Present on Admission:  . Pulmonary embolism: This occurred while on Coumadin, in fact his INR last week was supratherapeutic. Not sure if this is a Coumadin failure, since patient was not very ambulatory prior to surgery? PE prior to surgery-however he was not symptomatic like he is now. In any event, this M.D. spoke with oncology on call Dr. Galen Manila, was advised that probably is a Coumadin failure, and recommended to start Lovenox and continue for at least 48 hours, if he does well, he could potentially be transitioned to a NOAC on discharge. She recommended hematology follow-up in the next few months. This was explained to both patient, and spouse at bedside who agreed with this plan.   . Hypertension: Continue home antihypertensives, follow BP trend and titrate accordingly   . COPD (chronic obstructive pulmonary disease): Lungs  currently clear, this appears stable. Continue as needed albuterol nebulizer.  . Recent Bilateral knee replacement: Please notify patient's orthopedic surgeon-Dr. August Saucer tomorrow of patient's admission.  Further plan will depend as patient's clinical course evolves and further radiologic and laboratory data become available. Patient will be monitored closely.  Above noted plan was discussed with patient/spouse, they were in agreement.   DVT Prophylaxis: Therapeutic Lovenox   Code Status: Full Code  Disposition Plan: Home once stable   Total time spent for admission equals 45 minutes.  [facility] Pager [phone]  If 7PM-7AM, please contact night-coverage [email or web address] Password [email or web address] 06/15/2014, 1:09 PM

## 2014-06-15 NOTE — ED Provider Notes (Signed)
CSN: [id number]     Arrival date & time 06/15/14  0802 History   First MD Initiated Contact with Patient 06/15/14 [phone]     Chief Complaint  Patient presents with  . Shortness of Breath     (Consider location/radiation/quality/duration/timing/severity/associated sxs/prior Treatment) HPI Patient presents to the emergency department with shortness of breath and increasing over the last few days.  Patient had bilateral knee replacement, Coumadin following the surgery.  Patient states that his Coumadin level was high and they stopped it for 2 days and then started developing shortness of breath 2 days later.  The patient states that he has not had any chest pain, leg swelling, leg pain, nausea, vomiting, diarrhea, weakness, dizziness, headache, blurred vision, back pain, abdominal pain, fever, cough or sore throat or syncope.  The patient states that her CT scan done yesterday, which indicated a pulmonary embolus.  His doctor stated that if his condition worsen to come to the emergency department Past Medical History  Diagnosis Date  . Hypertension   . COPD (chronic obstructive pulmonary disease)   . Vertigo   . Kidney stone   . Shortness of breath dyspnea   . Arthritis    Past Surgical History  Procedure Laterality Date  . Kidney stone removal    . Cardiac catheterization    . Colonoscopy    . Total knee arthroplasty Bilateral 06/04/2014    Procedure: TOTAL KNEE BILATERAL;  Surgeon: Cammy Copa, MD;  Location: [facility];  Service: Orthopedics;  Laterality: Bilateral;   No family history on file. History  Substance Use Topics  . Smoking status: Former Smoker -- 20 years    Quit date: 08/16/2002  . Smokeless tobacco: Former Neurosurgeon  . Alcohol Use: No    Review of Systems  All other systems negative except as documented in the HPI. All pertinent positives and negatives as reviewed in the HPI.  Allergies  Review of patient's allergies indicates no known allergies.  Home  Medications   Prior to Admission medications   Medication Sig Start Date End Date Taking? Authorizing Provider  amLODipine (NORVASC) 5 MG tablet Take 5 mg by mouth daily.   Yes Historical Provider, MD  amoxicillin (AMOXIL) 875 MG tablet Take 875 mg by mouth 2 (two) times daily. 06/13/14 06/23/14 Yes Historical Provider, MD  atorvastatin (LIPITOR) 20 MG tablet Take 20 mg by mouth daily.   Yes Historical Provider, MD  docusate sodium (COLACE) 100 MG capsule Take 100 mg by mouth 2 (two) times daily.   Yes Historical Provider, MD  losartan (COZAAR) 100 MG tablet Take 100 mg by mouth daily.   Yes Historical Provider, MD  methocarbamol (ROBAXIN) 500 MG tablet Take 1 tablet (500 mg total) by mouth every 6 (six) hours as needed for muscle spasms. 06/06/14  Yes Cammy Copa, MD  Mometasone Furo-Formoterol Fum 200-5 MCG/ACT AERO Inhale 1 puff into the lungs daily.    Yes Historical Provider, MD  nebivolol (BYSTOLIC) 10 MG tablet Take 10 mg by mouth daily.   Yes Historical Provider, MD  oxyCODONE (OXY IR/ROXICODONE) 5 MG immediate release tablet Take 1-2 tablets (5-10 mg total) by mouth every 3 (three) hours as needed for breakthrough pain. 06/06/14  Yes Cammy Copa, MD  OxyCODONE (OXYCONTIN) 10 mg T12A 12 hr tablet Take 1 tablet (10 mg total) by mouth every 12 (twelve) hours. 06/06/14  Yes Cammy Copa, MD  VENTOLIN HFA 108 (90 BASE) MCG/ACT inhaler Inhale 2 puffs into the lungs every  4 (four) hours as needed. 06/13/14  Yes Historical Provider, MD  warfarin (COUMADIN) 5 MG tablet Take 1 tablet (5 mg total) by mouth daily. Patient taking differently: Take 2.5 mg by mouth daily.  06/06/14  Yes Cammy CopaGregory Scott Dean, MD   BP 137/91 mmHg  Pulse 74  Temp(Src) 97.6 F (36.4 C) (Oral)  Resp 22  Ht 5\' 11"  (1.803 m)  Wt 224 lb (101.606 kg)  BMI 31.26 kg/m2  SpO2 99% Physical Exam  Constitutional: He is oriented to person, place, and time. He appears well-developed and well-nourished. No distress.   HENT:  Head: Normocephalic and atraumatic.  Mouth/Throat: Oropharynx is clear and moist.  Eyes: Pupils are equal, round, and reactive to light.  Neck: Normal range of motion. Neck supple.  Cardiovascular: Normal rate, regular rhythm and normal heart sounds.  Exam reveals no gallop and no friction rub.   No murmur heard. Pulmonary/Chest: Effort normal and breath sounds normal. No respiratory distress.  Musculoskeletal: He exhibits no edema.  Neurological: He is alert and oriented to person, place, and time. He exhibits normal muscle tone. Coordination normal.  Skin: Skin is warm and dry.  Nursing note and vitals reviewed.   ED Course  Procedures (including critical care time) Labs Review Labs Reviewed  CBC WITH DIFFERENTIAL/PLATELET - Abnormal; Notable for the following:    RBC 3.75 (*)    Hemoglobin 11.8 (*)    HCT 35.7 (*)    All other components within normal limits  BASIC METABOLIC PANEL - Abnormal; Notable for the following:    Sodium 133 (*)    GFR calc non Af Amer 78 (*)    All other components within normal limits  PROTIME-INR - Abnormal; Notable for the following:    Prothrombin Time 23.9 (*)    INR 2.12 (*)    All other components within normal limits  TROPONIN I  BRAIN NATRIURETIC PEPTIDE    Imaging Review Dg Chest 2 View  06/15/2014   CLINICAL DATA:  Shortness of breath since knee replacement surgery one week ago.  EXAM: CHEST  2 VIEW  COMPARISON:  May 24, 2014.  FINDINGS: The heart size and mediastinal contours are within normal limits. Both lungs are clear. No pneumothorax or pleural effusion is noted. The visualized skeletal structures are unremarkable.  IMPRESSION: No acute cardiopulmonary abnormality seen.   Electronically Signed   By: Roque LiasJames  Green M.D.   On: 06/15/2014 10:55     EKG Interpretation   Date/Time:  Saturday June 15 2014 11:03:43 EST Ventricular Rate:  70 PR Interval:  142 QRS Duration: 95 QT Interval:  410 QTC Calculation: 442 R  Axis:   26 Text Interpretation:  Sinus rhythm Abnormal R-wave progression, early  transition Baseline wander in lead(s) V1 Confirmed by Lincoln Brighamees, Liz [id number]) on  06/15/2014 11:26:35 AM      MDM   Final diagnoses:  None    The patient has been stable other than during ambulation. The patient was hypoxic at that time. Patient will be admitted to the hospital for further care.   Carlyle DollyChristopher W Samvel Zinn, PA-C 06/16/14 1615  Tilden FossaElizabeth Rees, MD 06/16/14 [phone]

## 2014-06-15 NOTE — ED Notes (Signed)
Ambulated Patient without O2 while on Pulse Ox. Patient started at 99% on the side of the bed. Walking down the hall patient dropped down to 86%. Patient stated he was short of breath while walking.

## 2014-06-15 NOTE — Progress Notes (Signed)
ANTICOAGULATION CONSULT NOTE - Initial Consult  Pharmacy Consult for Lovenox Indication: New PE  No Known Allergies  Patient Measurements: Height: 5\' 11"  (180.3 cm) Weight: 224 lb (101.606 kg) IBW/kg (Calculated) : 75.3  Vital Signs: Temp: 97.6 F (36.4 C) (01/30 0837) Temp Source: Oral (01/30 0837) BP: 139/75 mmHg (01/30 1230) Pulse Rate: 70 (01/30 1230)  Labs:  Recent Labs  06/15/14 0834  HGB 11.8*  HCT 35.7*  PLT 400  LABPROT 23.9*  INR 2.12*  CREATININE 1.01  TROPONINI <0.03    Estimated Creatinine Clearance: 93.2 mL/min (by C-G formula based on Cr of 1.01).   Medical History: Past Medical History  Diagnosis Date  . Hypertension   . COPD (chronic obstructive pulmonary disease)   . Vertigo   . Kidney stone   . Shortness of breath dyspnea   . Arthritis     Assessment: [age] YOM who underwent bilateral knee replacements on 1/19 and was discharged on warfarin for VTE prophylaxis (INR at discharge was 2.49, discharge dose 5 mg daily) on 1/24. Shortly after discharge, the patient developed worsening SOB with outpatient imaging that revealed B/L PE. INR on admit 2.12 - pharmacy was consulted to start Lovenox for VTE treatment since clot developed with a therapeutic INR on warfarin.   Since INR is still within range, will need to monitor closely for signs/symptoms of bleeding,. Hgb 11.9, plts wnl, SCr 1.01, CrCl~80 ml/min.  Goal of Therapy:  Anti-Xa level 0.6-1 units/ml 4hrs after LMWH dose given Monitor platelets by anticoagulation protocol: Yes   Plan:  1. Lovenox 100 mg SQ x 1 dose now followed by 100 mg SQ every 12 hours 2. Will adjust regimen slightly by 1 hour with each administration until the administration times avoid being given in the middle of the night (and having to wake the patient) 3. Will continue to monitor for any signs/symptoms of bleeding and renal function for any necessary dose adjustments   Georgina PillionElizabeth Lititia Sen, PharmD, BCPS Clinical  Pharmacist Pager: [phone] 06/15/2014 1:17 PM

## 2014-06-15 NOTE — ED Notes (Signed)
Pt returned from x-ray and placed back on monitor.  

## 2014-06-15 NOTE — ED Notes (Addendum)
Pt. Had  bilaterazl knees replacements done on January 19th.  And pt. Developed sob the day after.  His PCP feels that it is anxiety.   Pt. Feel the sob has progressively become worse.  Pt. Is sleeping in [place]cl[place].   Pt. Has a hx of COPD and the he was put on a new inhaler. QID,    Pt is sob at rest. Skin is warm and dry.  No infection noted to the knee replacements.  Pt. Was placed on Coumadin day after surgery to prevent clots.  Pt. Was at [facility] and was informed that they found 2 small clots in each lung

## 2014-06-16 LAB — BASIC METABOLIC PANEL
ANION GAP: 4 — AB (ref 5–15)
BUN: 22 mg/dL (ref 6–23)
CHLORIDE: 105 mmol/L (ref 96–112)
CO2: 27 mmol/L (ref 19–32)
Calcium: 8.8 mg/dL (ref 8.4–10.5)
Creatinine, Ser: 1.01 mg/dL (ref 0.50–1.35)
GFR calc Af Amer: >90 mL/min (ref >90)
GFR calc non Af Amer: 78 mL/min — ABNORMAL LOW (ref >90)
Glucose, Bld: 108 mg/dL — ABNORMAL HIGH (ref 70–99)
POTASSIUM: 4.3 mmol/L (ref 3.5–5.1)
Sodium: 136 mmol/L (ref 135–145)

## 2014-06-16 LAB — CBC
HCT: 34.9 % — ABNORMAL LOW (ref 39.0–52.0)
Hemoglobin: 11.4 g/dL — ABNORMAL LOW (ref 13.0–17.0)
MCH: 31.5 pg (ref 26.0–34.0)
MCHC: 32.7 g/dL (ref 30.0–36.0)
MCV: 96.4 fL (ref 78.0–100.0)
Platelets: 385 10*3/uL (ref 150–400)
RBC: 3.62 MIL/uL — ABNORMAL LOW (ref 4.22–5.81)
RDW: 14.7 % (ref 11.5–15.5)
WBC: 8.1 10*3/uL (ref 4.0–10.5)

## 2014-06-16 NOTE — Progress Notes (Signed)
Ortho tech paged regarding CPM; ortho tech said they will get here as soon as they can.  [name], Devin Castro Orth M, RN

## 2014-06-16 NOTE — Progress Notes (Signed)
[facility]                                                                              Patient Demographics  Devin Castro, is a 64 y.o. male, DOB - Jul 31, 1950, HYQ:[id number]  Admit date - 06/15/2014   Admitting Physician Dewayne Shorter Levora Dredge, MD  Outpatient Primary MD for the patient is Eartha Inch, MD  LOS - 1   Chief Complaint  Patient presents with  . Shortness of Breath      HPI on 06/15/2014 by Dr. Jeoffrey Massed [name] is a 64 y.o. male with a Past Medical History of COPD, hypertension who was just discharged from this facility after bilateral knee replacement on 06/09/14 who presents today with the above noted complaint. Patient was discharged on Coumadin for VTE prophylaxis, on discharge his INR was therapeutic at 2.49, patient claims that the next day or so, home health RN drew PT/INR and the INR was more than 5. As a result his Coumadin was held for 2 days, and once INR was back between 2-3, Coumadin was resumed at a dose of 2.5 mg a day. During this time patient started developing worsening shortness of breath, he went to his PCPs office where a d-dimer was done that was elevated, this was followed by a CT angiogram of the chest done on 1/29 (at [facility]) which was positive for bilateral pulmonary embolism. Patient was then referred to the emergency room, subsequently I was asked to admit this patient for further evaluation and treatment. Interestingly, prior to the surgery, because of severe arthritis patient was not very mobile, but he did not have shortness of breath or leg swelling prior to his surgery. Patient's wife claims that patient also had bilateral lower extremity Dopplers yesterday as well which was negative. Patient currently appears stable, he denies any chest pain, or any shortness of breath at rest he does note history of nausea, vomiting or diarrhea. Please note, patient has no prior history of DVT or PE.  Assessment & Plan   Acute pulmonary  embolism -patient was on coumadin as an outpatient after B/L knee replacement, and was therapeutic, however possible coumadin failure -Admitting physician spoke with Dr. Galen Manila (hem/onc) who recommened new NOAC after 48 hours of Lovenox and outpatient followup  -CT chest angiongram done as outpatient on 1/29 +B/L PE  Dyspnea  -Improved, secondary to above  Hypertension -Stable, continue amlodipine, Cozaar, diastolic  COPD -Lungs are currently clear, no wheezing -Continue albuterol as needed and dulera  Recent bilateral knee replacement -Orthopedic surgery (Dr. August Saucer) made aware of patient's admission  Hyperlipidemia -Continue statin  Code Status: Full  Family Communication: Wife at beside  Disposition Plan: Admitted  Time Spent in minutes   30 minutes  Procedures  None  Consults   Orthopedics Dr. Nunzio Cory, onc, by admitting physician  DVT Prophylaxis  Lovenox  Lab Results  Component Value Date   PLT 385 06/16/2014    Medications  Scheduled Meds: . amLODipine  5 mg Oral Daily  . amoxicillin-clavulanate  1 tablet Oral Q12H  . atorvastatin  20 mg Oral q1800  . enoxaparin (LOVENOX) injection  100 mg Subcutaneous Once   Followed  by  . Melene Muller ON 06/17/2014] enoxaparin (LOVENOX) injection  100 mg Subcutaneous Q12H  . losartan  100 mg Oral Daily  . mometasone-formoterol  1 puff Inhalation Daily  . nebivolol  10 mg Oral Daily  . OxyCODONE  10 mg Oral Q12H  . sodium chloride  3 mL Intravenous Q12H  . sodium chloride  3 mL Intravenous Q12H   Continuous Infusions:  PRN Meds:.sodium chloride, albuterol, guaiFENesin-dextromethorphan, methocarbamol, ondansetron **OR** ondansetron (ZOFRAN) IV, oxyCODONE, sodium chloride  Antibiotics    Anti-infectives    Start     Dose/Rate Route Frequency Ordered Stop   06/15/14 1415  amoxicillin-clavulanate (AUGMENTIN) 875-125 MG per tablet 1 tablet     1 tablet Oral Every 12 hours 06/15/14 1403     06/15/14 1400  amoxicillin  (AMOXIL) tablet 875 mg  Status:  Discontinued     875 mg Oral 2 times daily 06/15/14 1345 06/15/14 1406        Subjective:   Bartholome Bill seen and examined today.  Patient feels her shortness of breath has improved. Denies any chest pain, dizziness, cough, abdominal pain.  Objective:   Filed Vitals:   06/15/14 1315 06/15/14 1343 06/15/14 2016 06/16/14 0422  BP: 135/70 151/74 121/68 139/84  Pulse: 78 95 71 68  Temp:  97.6 F (36.4 C) 98.1 F (36.7 C) 97.9 F (36.6 C)  TempSrc:  Oral Oral Oral  Resp: Height:   (1.803 m)    Weight:  100.5 kg (221 lb 9 oz)    SpO2: 95% 94% 95% 94%    Wt Readings from Last 3 Encounters:  06/15/14 100.5 kg (221 lb 9 oz)  06/04/14 100.699 kg (222 lb)  09/16/12 99.791 kg (220 lb)     Intake/Output Summary (Last 24 hours) at 06/16/14 1029 Last data filed at 06/16/14 0730  Gross per 24 hour  Intake    600 ml  Output   2001 ml  Net  -1401 ml    Exam  General: Well developed, well nourished, NAD, appears stated age  HEENT: NCAT, mucous membranes moist.   Cardiovascular: S1 S2 auscultated, no rubs, murmurs or gallops. Regular rate and rhythm.  Respiratory: Clear to auscultation bilaterally with equal chest rise  Abdomen: Soft, nontender, nondistended, + bowel sounds  Extremities: warm dry without cyanosis clubbing. Trace edema LE B/L, bandages on knees B/L  Neuro: AAOx3, nonfocal  Psych: Normal affect and demeanor with intact judgement and insight  Data Review   Micro Results No results found for this or any previous visit (from the past 240 hour(s)).  Radiology Reports Dg Chest 2 View  06/15/2014   CLINICAL DATA:  Shortness of breath since knee replacement surgery one week ago.  EXAM: CHEST  2 VIEW  COMPARISON:  May 24, 2014.  FINDINGS: The heart size and mediastinal contours are within normal limits. Both lungs are clear. No pneumothorax or pleural effusion is noted. The visualized skeletal structures are  unremarkable.  IMPRESSION: No acute cardiopulmonary abnormality seen.   Electronically Signed   By: Roque Lias M.D.   On: 06/15/2014 10:55   Dg Chest 2 View  05/24/2014   CLINICAL DATA:  COPD, hypertension  EXAM: CHEST  2 VIEW  COMPARISON:  05/23/2009  FINDINGS: Cardiomediastinal silhouette is stable. No acute infiltrate or pleural effusion. No pulmonary edema. Again noted mild elevation of the right hemidiaphragm. Bony thorax is stable.  IMPRESSION: No active cardiopulmonary disease.  No significant change.   Electronically  Signed   By: Natasha MeadLiviu  Pop M.D.   On: 05/24/2014 10:11    CBC  Recent Labs Lab 06/15/14 0834 06/16/14 0315  WBC 8.4 8.1  HGB 11.8* 11.4*  HCT 35.7* 34.9*  PLT 400 385  MCV 95.2 96.4  MCH 31.5 31.5  MCHC 33.1 32.7  RDW 14.8 14.7  LYMPHSABS 2.2  --   MONOABS 0.8  --   EOSABS 0.2  --   BASOSABS 0.0  --     Chemistries   Recent Labs Lab 06/15/14 0834 06/16/14 0315  NA 133* 136  K 4.6 4.3  CL 104 105  CO2 20 27  GLUCOSE 96 108*  BUN 21 22  CREATININE 1.01 1.01  CALCIUM 8.8 8.8   ------------------------------------------------------------------------------------------------------------------ estimated creatinine clearance is 92.8 mL/min (by C-G formula based on Cr of 1.01). ------------------------------------------------------------------------------------------------------------------ No results for input(s): HGBA1C in the last 72 hours. ------------------------------------------------------------------------------------------------------------------ No results for input(s): CHOL, HDL, LDLCALC, TRIG, CHOLHDL, LDLDIRECT in the last 72 hours. ------------------------------------------------------------------------------------------------------------------ No results for input(s): TSH, T4TOTAL, T3FREE, THYROIDAB in the last 72 hours.  Invalid input(s):  FREET3 ------------------------------------------------------------------------------------------------------------------ No results for input(s): VITAMINB12, FOLATE, FERRITIN, TIBC, IRON, RETICCTPCT in the last 72 hours.  Coagulation profile  Recent Labs Lab 06/15/14 0834  INR 2.12*    No results for input(s): DDIMER in the last 72 hours.  Cardiac Enzymes  Recent Labs Lab 06/15/14 0834  TROPONINI <0.03   ------------------------------------------------------------------------------------------------------------------ Invalid input(s): POCBNP    Kemarion Abbey D.O. on 06/16/2014 at 10:29 AM  Between 7am to 7pm - Pager - [phone]  After 7pm go to [email or web address] - password [email or web address]  And look for the night coverage person covering for me after hours  [facility] Office  [phone]

## 2014-06-16 NOTE — Progress Notes (Signed)
Pt was in CPM for 1.5 hrs.  [name], Pairlee Sawtell M, RN

## 2014-06-16 NOTE — Progress Notes (Signed)
Orthopedic Tech Progress Note Patient Details:  Devin Castro [birth year] [id number] CPM applied bilaterally. Patient has previously had CPM (post op a few weeks out) and inquired about how to adjust CPM settings himself if he needed to. Patient and wife educated on CPM use and remote functions.  CPM Left Knee CPM Left Knee: On Left Knee Flexion (Degrees): 90 Left Knee Extension (Degrees): 0 CPM Right Knee CPM Right Knee: On Right Knee Flexion (Degrees): 90 Right Knee Extension (Degrees): 0   Asia R Thompson 06/16/2014, 10:07 AM

## 2014-06-17 DIAGNOSIS — I2699 Other pulmonary embolism without acute cor pulmonale: Secondary | ICD-10-CM | POA: Insufficient documentation

## 2014-06-17 LAB — CBC
HEMATOCRIT: 36.4 % — AB (ref 39.0–52.0)
Hemoglobin: 11.9 g/dL — ABNORMAL LOW (ref 13.0–17.0)
MCH: 31.6 pg (ref 26.0–34.0)
MCHC: 32.7 g/dL (ref 30.0–36.0)
MCV: 96.6 fL (ref 78.0–100.0)
Platelets: 395 10*3/uL (ref 150–400)
RBC: 3.77 MIL/uL — AB (ref 4.22–5.81)
RDW: 14.8 % (ref 11.5–15.5)
WBC: 9.2 10*3/uL (ref 4.0–10.5)

## 2014-06-17 LAB — PROTIME-INR
INR: 1.53 — AB (ref 0.00–1.49)
Prothrombin Time: 18.5 seconds — ABNORMAL HIGH (ref 11.6–15.2)

## 2014-06-17 MED ORDER — RIVAROXABAN 15 MG PO TABS
15.0000 mg | ORAL_TABLET | Freq: Two times a day (BID) | ORAL | Status: DC
  Filled 2014-06-17 (×2): qty 1

## 2014-06-17 MED ORDER — RIVAROXABAN 20 MG PO TABS: 20.0000 mg | ORAL_TABLET | Freq: Every day | ORAL | Status: DC

## 2014-06-17 MED ORDER — RIVAROXABAN (XARELTO) VTE STARTER PACK (15 & 20 MG): ORAL_TABLET | ORAL | Status: DC

## 2014-06-17 NOTE — Discharge Instructions (Signed)
Information on my medicine - XARELTO (rivaroxaban)  This medication education was reviewed with me or my healthcare representative as part of my discharge preparation.    WHY WAS XARELTO PRESCRIBED FOR YOU? Xarelto was prescribed to treat blood clots that may have been found in the veins of your legs (deep vein thrombosis) or in your lungs (pulmonary embolism) and to reduce the risk of them occurring again.  What do you need to know about Xarelto? The starting dose is one 15 mg tablet taken TWICE daily with food for the FIRST 21 DAYS then on 2/23 the dose is changed to one 20 mg tablet taken ONCE A DAY with your evening meal.  DO NOT stop taking Xarelto without talking to the health care provider who prescribed the medication.  Refill your prescription for 20 mg tablets before you run out.  After discharge, you should have regular check-up appointments with your healthcare provider that is prescribing your Xarelto.  In the future your dose may need to be changed if your kidney function changes by a significant amount.  What do you do if you miss a dose? If you are taking Xarelto TWICE DAILY and you miss a dose, take it as soon as you remember. You may take two 15 mg tablets (total 30 mg) at the same time then resume your regularly scheduled 15 mg twice daily the next day.  If you are taking Xarelto ONCE DAILY and you miss a dose, take it as soon as you remember on the same day then continue your regularly scheduled once daily regimen the next day. Do not take two doses of Xarelto at the same time.   Important Safety Information Xarelto is a blood thinner medicine that can cause bleeding. You should call your healthcare provider right away if you experience any of the following: ? Bleeding from an injury or your nose that does not stop. ? Unusual colored urine (red or dark brown) or unusual colored stools (red or black). ? Unusual bruising for unknown reasons. ? A serious fall or if  you hit your head (even if there is no bleeding).  Some medicines may interact with Xarelto and might increase your risk of bleeding while on Xarelto. To help avoid this, consult your healthcare provider or pharmacist prior to using any new prescription or non-prescription medications, including herbals, vitamins, non-steroidal anti-inflammatory drugs (NSAIDs) and supplements.  This website has more information on Xarelto: VisitDestination.com.br.    Pulmonary Embolism A pulmonary (lung) embolism (PE) is a blood clot that has traveled to the lung and results in a blockage of blood flow in the affected lung. Most clots come from deep veins in the legs or pelvis. PE is a dangerous and potentially life-threatening condition that can be treated if identified. CAUSES Blood clots form in a vein for different reasons. Usually several things cause blood clots. They include:  The flow of blood slows down.  The inside of the vein is damaged in some way.  The person has a condition that makes the blood clot more easily. RISK FACTORS Some people are more likely than others to develop PE. Risk factors include:   Smoking.  Being overweight (obese).  Sitting or lying still for a long time. This includes long-distance travel, paralysis, or recovery from an illness or surgery. Other factors that increase risk are:   Older age, especially over 84 years of age.  Having a family history of blood clots or if you have already had a blood  clot.  Having major or lengthy surgery. This is especially true for surgery on the hip, knee, or belly (abdomen). Hip surgery is particularly high risk.  Having a long, thin tube (catheter) placed inside a vein during a medical procedure.  Breaking a hip or leg.  Having cancer or cancer treatment.  Medicines containing the male hormone estrogen. This includes birth control pills and hormone replacement therapy.  Other circulation or heart problems.  Pregnancy and  childbirth.  Hormone changes make the blood clot more easily during pregnancy.  The fetus puts pressure on the veins of the pelvis.  There is a risk of injury to veins during delivery or a caesarean delivery. The risk is highest just after childbirth.  PREVENTION   Exercise the legs regularly. Take a brisk 30 minute walk every day.  Maintain a weight that is appropriate for your height.  Avoid sitting or lying in bed for long periods of time without moving your legs.  Women, particularly those over the age of 35 years, should consider the risks and benefits of taking estrogen medicines, including birth control pills.  Do not smoke, especially if you take estrogen medicines.  Long-distance travel can increase your risk. You should exercise your legs by walking or pumping the muscles every hour.  Many of the risk factors above relate to situations that exist with hospitalization, either for illness, injury, or elective surgery. Prevention may include medical and nonmedical measures.   Your health care provider will assess you for the need for venous thromboembolism prevention when you are admitted to the hospital. If you are having surgery, your surgeon will assess you the day of or day after surgery.  SYMPTOMS  The symptoms of a PE usually start suddenly and include:  Shortness of breath.  Coughing.  Coughing up blood or blood-tinged mucus.  Chest pain. Pain is often worse with deep breaths.  Rapid heartbeat. DIAGNOSIS  If a PE is suspected, your health care provider will take a medical history and perform a physical exam. Other tests that may be required include:  Blood tests, such as studies of the clotting properties of your blood.  Imaging tests, such as ultrasound, CT, MRI, and other tests to see if you have clots in your legs or lungs.  An electrocardiogram. This can look for heart strain from blood clots in the lungs. TREATMENT   The most common treatment for a  PE is blood thinning (anticoagulant) medicine, which reduces the blood's tendency to clot. Anticoagulants can stop new blood clots from forming and old clots from growing. They cannot dissolve existing clots. Your body does this by itself over time. Anticoagulants can be given by mouth, through an intravenous (IV) tube, or by injection. Your health care provider will determine the best program for you.  Less commonly, clot-dissolving medicines (thrombolytics) are used to dissolve a PE. They carry a high risk of bleeding, so they are used mainly in severe cases.  Very rarely, a blood clot in the leg needs to be removed surgically.  If you are unable to take anticoagulants, your health care provider may arrange for you to have a filter placed in a main vein in your abdomen. This filter prevents clots from traveling to your lungs. HOME CARE INSTRUCTIONS   Take all medicines as directed by your health care provider.  Learn as much as you can about DVT.  Wear a medical alert bracelet or carry a medical alert card.  Ask your health care provider how  soon you can go back to normal activities. It is important to stay active to prevent blood clots. If you are on anticoagulant medicine, avoid contact sports.  It is very important to exercise. This is especially important while traveling, sitting, or standing for long periods of time. Exercise your legs by walking or by tightening and relaxing your leg muscles regularly. Take frequent walks.  You may need to wear compression stockings. These are tight elastic stockings that apply pressure to the lower legs. This pressure can help keep the blood in the legs from clotting. Taking Warfarin Warfarin is a daily medicine that is taken by mouth. Your health care provider will advise you on the length of treatment (usually 3-6 months, sometimes lifelong). If you take warfarin:  Understand how to take warfarin and foods that can affect how warfarin works in Advertising account planneryour  body.  Too much and too little warfarin are both dangerous. Too much warfarin increases the risk of bleeding. Too little warfarin continues to allow the risk for blood clots. Warfarin and Regular Blood Testing While taking warfarin, you will need to have regular blood tests to measure your blood clotting time. These blood tests usually include both the prothrombin time (PT) and international normalized ratio (INR) tests. The PT and INR results allow your health care provider to adjust your dose of warfarin. It is very important that you have your PT and INR tested as often as directed by your health care provider.  Warfarin and Your Diet Avoid major changes in your diet, or notify your health care provider before changing your diet. Arrange a visit with a registered dietitian to answer your questions. Many foods, especially foods high in vitamin K, can interfere with warfarin and affect the PT and INR results. You should eat a consistent amount of foods high in vitamin K. Foods high in vitamin K include:   Spinach, kale, broccoli, cabbage, collard and turnip greens, Brussels sprouts, peas, cauliflower, seaweed, and parsley.  Beef and pork liver.  Green tea.  Soybean oil. Warfarin with Other Medicines Many medicines can interfere with warfarin and affect the PT and INR results. You must:  Tell your health care provider about any and all medicines, vitamins, and supplements you take, including aspirin and other over-the-counter anti-inflammatory medicines. Be especially cautious with aspirin and anti-inflammatory medicines. Ask your health care provider before taking these.  Do not take or discontinue any prescribed or over-the-counter medicine except on the advice of your health care provider or pharmacist. Warfarin Side Effects Warfarin can have side effects, such as easy bruising and difficulty stopping bleeding. Ask your health care provider or pharmacist about other side effects of warfarin.  You will need to:  Hold pressure over cuts for longer than usual.  Notify your dentist and other health care providers that you are taking warfarin before you undergo any procedures where bleeding may occur. Warfarin with Alcohol and Tobacco   Drinking alcohol frequently can increase the effect of warfarin, leading to excess bleeding. It is best to avoid alcoholic drinks or consume only very small amounts while taking warfarin. Notify your health care provider if you change your alcohol intake.  Do not use any tobacco products including cigarettes, chewing tobacco, or electronic cigarettes. If you smoke, quit. Ask your health care provider for help with quitting smoking. Alternative Medicines to Warfarin: Factor Xa Inhibitor Medicines  These blood thinning medicines are taken by mouth, usually for several weeks or longer. It is important to take the medicine  every single day, at the same time each day.  There are no regular blood tests required when using these medicines.  There are fewer food and drug interactions than with warfarin.  The side effects of this class of medicine is similar to that of warfarin, including excessive bruising or bleeding. Ask your health care provider or pharmacist about other potential side effects. SEEK MEDICAL CARE IF:   You notice a rapid heartbeat.  You feel weaker or more tired than usual.  You feel faint.  You notice increased bruising.  Your symptoms are not getting better in the time expected.  You are having side effects of medicine. SEEK IMMEDIATE MEDICAL CARE IF:   You have chest pain.  You have trouble breathing.  You have new or increased swelling or pain in one leg.  You cough up blood.  You notice blood in vomit, in a bowel movement, or in urine.  You have a fever. Symptoms of PE may represent a serious problem that is an emergency. Do not wait to see if the symptoms will go away. Get medical help right away. Call your local  emergency services (911 in the [place]). Do not drive yourself to the hospital. Document Released: 04/30/2000 Document Revised: 09/17/2013 Document Reviewed: 05/14/2013 [facility] Patient Information 2015 [place], [place]. This information is not intended to replace advice given to you by your health care provider. Make sure you discuss any questions you have with your health care provider.

## 2014-06-17 NOTE — Progress Notes (Signed)
Events noted Appreciate med consult for pe issues Dc today Knees doing well with rom and alignment

## 2014-06-17 NOTE — Progress Notes (Signed)
ANTICOAGULATION CONSULT NOTE - Initial Consult  Pharmacy Consult for Lovenox Indication: New PE  No Known Allergies  Patient Measurements: Height: 5\' 11"  (180.3 cm) Weight: 221 lb 9 oz (100.5 kg) IBW/kg (Calculated) : 75.3  Vital Signs: Temp: 98.4 F (36.9 C) (02/01 0500) Temp Source: Oral (02/01 0500) BP: 139/75 mmHg (02/01 0500) Pulse Rate: 76 (02/01 0500)  Labs:  Recent Labs  06/15/14 0834 06/16/14 0315 06/17/14 0500  HGB 11.8* 11.4* 11.9*  HCT 35.7* 34.9* 36.4*  PLT 400 385 395  LABPROT 23.9*  --   --   INR 2.12*  --   --   CREATININE 1.01 1.01  --   TROPONINI <0.03  --   --     Estimated Creatinine Clearance: 92.8 mL/min (by C-G formula based on Cr of 1.01).   Medical History: Past Medical History  Diagnosis Date  . Hypertension   . COPD (chronic obstructive pulmonary disease)   . Vertigo   . Kidney stone   . Shortness of breath dyspnea   . Arthritis     Assessment: [age] YOM who underwent bilateral knee replacements on 1/19, unfortunately developed new PE while on therapeutic coumadin. Pt has been on full dose lovenox since 1/31, now to switch to xarelto. INR down to 1.53 this morning. hgb 11.9 stable, plt wnl. Scr 1.01, est. crcl > 90 ml/min. Most recent lovenox dose given this morning at 0600   Goal of Therapy:  Monitor platelets by anticoagulation protocol: Yes   Plan:  - Xarelto 15mg  BID x 21 days, then switch to 20 mg daily on 2/23  - Monitor CBC and renal function - Xarelto education with Pharmacist   Bayard HuggerMei Allex Madia, PharmD, BCPS  Clinical Pharmacist  Pager: [phone]  06/17/2014 10:28 AM

## 2014-06-17 NOTE — Discharge Summary (Addendum)
Physician Discharge Summary  JJ ENYEART ZOX:[id number] DOB: 01-22-1951 DOA: 06/15/2014  PCP: Eartha Inch, MD  Admit date: 06/15/2014 Discharge date: 06/17/2014  Time spent: 45 minutes  Recommendations for Outpatient Follow-up:  Patient will be discharged to home.  He will need follow-up with his primary care physician within one week discharge. Dr. August Saucer. Patient should follow-up with hematology/oncology, Dr. Galen Manila within 2 months. Patient should follow a heart healthy diet. Continue medications as prescribed.  Discharge Diagnoses:  Pulmonary embolism Hypertension COPD (chronic obstructive pulmonary disease) Dyspnea Recent bilateral knee replacement  Discharge Condition: Stable  Diet recommendation: Heart healthy  Filed Weights   06/15/14 0837 06/15/14 1343  Weight: 101.606 kg (224 lb) 100.5 kg (221 lb 9 oz)    History of present illness:  on 06/15/2014 by Dr. Jeoffrey Massed Devin Castro is a 65 y.o. male with a Past Medical History of COPD, hypertension who was just discharged from this facility after bilateral knee replacement on 06/09/14 who presents today with the above noted complaint. Patient was discharged on Coumadin for VTE prophylaxis, on discharge his INR was therapeutic at 2.49, patient claims that the next day or so, home health RN drew PT/INR and the INR was more than 5. As a result his Coumadin was held for 2 days, and once INR was back between 2-3, Coumadin was resumed at a dose of 2.5 mg a day. During this time patient started developing worsening shortness of breath, he went to his PCPs office where a d-dimer was done that was elevated, this was followed by a CT angiogram of the chest done on 1/29 (at [facility]) which was positive for bilateral pulmonary embolism. Patient was then referred to the emergency room, subsequently I was asked to admit this patient for further evaluation and treatment. Interestingly, prior to the surgery, because of severe  arthritis patient was not very mobile, but he did not have shortness of breath or leg swelling prior to his surgery. Patient's wife claims that patient also had bilateral lower extremity Dopplers yesterday as well which was negative. Patient currently appears stable, he denies any chest pain, or any shortness of breath at rest he does note history of nausea, vomiting or diarrhea. Please note, patient has no prior history of DVT or PE.  Hospital Course:  Acute pulmonary embolism -patient was on coumadin as an outpatient after B/L knee replacement, and was therapeutic, however possible coumadin failure -Admitting physician spoke with Dr. Galen Manila (hem/onc) who recommened new NOAC after 48 hours of Lovenox and outpatient followup  -CT chest angiongram done as outpatient on 1/29 +B/L PE -Patient was started on xarelto on day of discharge  Dyspnea  -Improved, secondary to above  Hypertension -Stable, continue amlodipine, Cozaar, diastolic  COPD -Lungs are currently clear, no wheezing -Continue albuterol as needed and dulera  Recent bilateral knee replacement -Orthopedic surgery (Dr. August Saucer) made aware of patient's admission  Hyperlipidemia -Continue statin  Procedures:  None  Consultations:  Orthopedics (Spoke with Dr. Ophelia Charter, who stated he would let Dr. August Saucer know patient was here)   Dr. Galen Manila, oncology, by admitting physician  Discharge Exam: Filed Vitals:   06/17/14 0500  BP: 139/75  Pulse: 76  Temp: 98.4 F (36.9 C)  Resp: 18   Exam  General: Well developed, well nourished, NAD, appears stated age  HEENT: NCAT, mucous membranes moist.   Cardiovascular: S1 S2 auscultated, no rubs, murmurs or gallops. Regular rate and rhythm.  Respiratory: Clear to auscultation bilaterally with equal chest rise  Abdomen: Soft, nontender, nondistended, + bowel sounds  Extremities: warm dry without cyanosis clubbing. Trace edema LE B/L, bandages on knees B/L  Neuro: AAOx3,  nonfocal  Psych: Normal affect and demeanor with intact judgement and insight  Discharge Instructions      Discharge Instructions    Discharge instructions    Complete by:  As directed   Patient will be discharged to home.  He will need follow-up with his primary care physician within one week discharge. Dr. August Saucerean. Patient should follow-up with hematology/oncology, Dr. Galen ManilaPenland within 2 months. Patient should follow a heart healthy diet. Continue medications as prescribed.            Medication List    STOP taking these medications        warfarin 5 MG tablet  Commonly known as:  COUMADIN      TAKE these medications        amLODipine 5 MG tablet  Commonly known as:  NORVASC  Take 5 mg by mouth daily.     amoxicillin 875 MG tablet  Commonly known as:  AMOXIL  Take 875 mg by mouth 2 (two) times daily.     atorvastatin 20 MG tablet  Commonly known as:  LIPITOR  Take 20 mg by mouth daily.     docusate sodium 100 MG capsule  Commonly known as:  COLACE  Take 100 mg by mouth 2 (two) times daily.     losartan 100 MG tablet  Commonly known as:  COZAAR  Take 100 mg by mouth daily.     methocarbamol 500 MG tablet  Commonly known as:  ROBAXIN  Take 1 tablet (500 mg total) by mouth every 6 (six) hours as needed for muscle spasms.     mometasone-formoterol 200-5 MCG/ACT Aero  Commonly known as:  DULERA  Inhale 1 puff into the lungs daily.     nebivolol 10 MG tablet  Commonly known as:  BYSTOLIC  Take 10 mg by mouth daily.     oxyCODONE 5 MG immediate release tablet  Commonly known as:  Oxy IR/ROXICODONE  Take 1-2 tablets (5-10 mg total) by mouth every 3 (three) hours as needed for breakthrough pain.     OxyCODONE 10 mg T12a 12 hr tablet  Commonly known as:  OXYCONTIN  Take 1 tablet (10 mg total) by mouth every 12 (twelve) hours.     Rivaroxaban 15 & 20 MG Tbpk  Commonly known as:  XARELTO STARTER PACK  Take as directed on package: Start with one 15mg  tablet by  mouth twice a day with food. On Day 22, switch to one 20mg  tablet once a day with food.     VENTOLIN HFA 108 (90 BASE) MCG/ACT inhaler  Generic drug:  albuterol  Inhale 2 puffs into the lungs every 4 (four) hours as needed.       No Known Allergies Follow-up Information    Follow up with BADGER,MICHAEL C, MD. Schedule an appointment as soon as possible for a visit in 1 week.   Specialty:  Family Medicine   Why:  Hospital follow-up   Contact information:   [address] [place] [address] [phone]       Follow up with Arvil ChacoPenland,Shannon Kristen, MD.   Specialties:  Hematology and Oncology, Oncology   Why:  Hospital followup, Pulmonary embolism while on coumadin   Contact information:   [address] [place] [phone]       Follow up with [facility],GREGORY SCOTT,  MD.   Specialty:  Orthopedic Surgery   Contact information:   [address] [place] [address] [phone]        The results of significant diagnostics from this hospitalization (including imaging, microbiology, ancillary and laboratory) are listed below for reference.    Significant Diagnostic Studies: Dg Chest 2 View  06/15/2014   CLINICAL DATA:  Shortness of breath since knee replacement surgery one week ago.  EXAM: CHEST  2 VIEW  COMPARISON:  May 24, 2014.  FINDINGS: The heart size and mediastinal contours are within normal limits. Both lungs are clear. No pneumothorax or pleural effusion is noted. The visualized skeletal structures are unremarkable.  IMPRESSION: No acute cardiopulmonary abnormality seen.   Electronically Signed   By: Roque Lias M.D.   On: 06/15/2014 10:55   Dg Chest 2 View  05/24/2014   CLINICAL DATA:  COPD, hypertension  EXAM: CHEST  2 VIEW  COMPARISON:  05/23/2009  FINDINGS: Cardiomediastinal silhouette is stable. No acute infiltrate or pleural effusion. No pulmonary edema. Again noted mild elevation of the right hemidiaphragm. Bony thorax is stable.   IMPRESSION: No active cardiopulmonary disease.  No significant change.   Electronically Signed   By: Natasha Mead M.D.   On: 05/24/2014 10:11    Microbiology: No results found for this or any previous visit (from the past 240 hour(s)).   Labs: Basic Metabolic Panel:  Recent Labs Lab 06/15/14 0834 06/16/14 0315  NA 133* 136  K 4.6 4.3  CL 104 105  CO2 20 27  GLUCOSE 96 108*  BUN 21 22  CREATININE 1.01 1.01  CALCIUM 8.8 8.8   Liver Function Tests: No results for input(s): AST, ALT, ALKPHOS, BILITOT, PROT, ALBUMIN in the last 168 hours. No results for input(s): LIPASE, AMYLASE in the last 168 hours. No results for input(s): AMMONIA in the last 168 hours. CBC:  Recent Labs Lab 06/15/14 0834 06/16/14 0315 06/17/14 0500  WBC 8.4 8.1 9.2  NEUTROABS 5.2  --   --   HGB 11.8* 11.4* 11.9*  HCT 35.7* 34.9* 36.4*  MCV 95.2 96.4 96.6  PLT 400 385 395   Cardiac Enzymes:  Recent Labs Lab 06/15/14 0834  TROPONINI <0.03   BNP: BNP (last 3 results) No results for input(s): PROBNP in the last 8760 hours. CBG: No results for input(s): GLUCAP in the last 168 hours.     SignedEdsel Petrin  [facility] 06/17/2014, 11:20 AM

## 2014-06-17 NOTE — Care Management Note (Signed)
    Page 1 of 2   06/17/2014     2:33:08 PM CARE MANAGEMENT NOTE 06/17/2014  Patient:  Devin Castro,Devin Castro   Account Number:  [id number]  Date Initiated:  06/17/2014  Documentation initiated by:  Donn PieriniWEBSTER,Charnise Lovan  Subjective/Objective Assessment:   Pt admitted with PE, s/p recent bil total knees     Action/Plan:   PTA pt lived at home with spouse- [facility]- will need resumption orders for discharge   Anticipated DC Date:  06/18/2014   Anticipated DC Plan:  HOME W HOME HEALTH SERVICES      DC Planning Services  CM consult  Medication Assistance      [facility] Choice  Resumption Of Svcs/PTA Provider   Choice offered to / List presented to:          [facility]  [facility]  [facility]      [facility]  [facility]   Status of service:  Completed, signed off Medicare Important Message given?  NA - LOS <3 / Initial given by admissions (If response is "NO", the following Medicare IM given date fields will be blank) Date Medicare IM given:   Medicare IM given by:   Date Additional Medicare IM given:   Additional Medicare IM given by:    Discharge Disposition:  HOME W HOME HEALTH SERVICES  Per UR Regulation:  Reviewed for med. necessity/level of care/duration of stay  If discussed at Long Length of Stay Meetings, dates discussed:    Comments:  06/17/14- 1030- Donn PieriniKristi Charlene Cowdrey RN, BSN [phone] Requested Xarelto benefits check- [employer] Pharm[facility] Line at [phone]. Talked to CSR MarshallLillian (Ref # Gardiner RamusLillian 06/17/2014). XARELTO covered. No Prior Authorization required. $45.00 retail pharmacy co-payment for both 15mg  BID for 21 days and 20mg  Daily dosage amounts. Spoke with pt- and gave 30 day free card for Xarelto and informed of info above- [facility]- [facility] Corrie DandyMary [facility] regarding [facility] today

## 2014-07-16 ENCOUNTER — Encounter (HOSPITAL_COMMUNITY): Payer: Self-pay | Admitting: Hematology & Oncology

## 2014-07-16 ENCOUNTER — Encounter (HOSPITAL_COMMUNITY): Payer: Medicare Other | Attending: Hematology & Oncology | Admitting: Hematology & Oncology

## 2014-07-16 VITALS — BP 121/77 | HR 95 | Temp 98.3°F | Resp 18 | Ht 69.5 in | Wt 216.4 lb

## 2014-07-16 DIAGNOSIS — I2699 Other pulmonary embolism without acute cor pulmonale: Secondary | ICD-10-CM | POA: Diagnosis not present

## 2014-07-16 DIAGNOSIS — J449 Chronic obstructive pulmonary disease, unspecified: Secondary | ICD-10-CM | POA: Diagnosis not present

## 2014-07-16 DIAGNOSIS — I82409 Acute embolism and thrombosis of unspecified deep veins of unspecified lower extremity: Secondary | ICD-10-CM | POA: Insufficient documentation

## 2014-07-16 DIAGNOSIS — Z7901 Long term (current) use of anticoagulants: Secondary | ICD-10-CM | POA: Insufficient documentation

## 2014-07-16 DIAGNOSIS — I10 Essential (primary) hypertension: Secondary | ICD-10-CM | POA: Insufficient documentation

## 2014-07-16 LAB — CBC WITH DIFFERENTIAL/PLATELET
Basophils Absolute: 0 10*3/uL (ref 0.0–0.1)
Basophils Relative: 0 % (ref 0–1)
Eosinophils Absolute: 0.2 10*3/uL (ref 0.0–0.7)
Eosinophils Relative: 3 % (ref 0–5)
HCT: 41.8 % (ref 39.0–52.0)
HEMOGLOBIN: 13.8 g/dL (ref 13.0–17.0)
LYMPHS ABS: 2 10*3/uL (ref 0.7–4.0)
LYMPHS PCT: 24 % (ref 12–46)
MCH: 32.5 pg (ref 26.0–34.0)
MCHC: 33 g/dL (ref 30.0–36.0)
MCV: 98.6 fL (ref 78.0–100.0)
MONOS PCT: 9 % (ref 3–12)
Monocytes Absolute: 0.8 10*3/uL (ref 0.1–1.0)
NEUTROS ABS: 5.3 10*3/uL (ref 1.7–7.7)
NEUTROS PCT: 64 % (ref 43–77)
Platelets: 284 10*3/uL (ref 150–400)
RBC: 4.24 MIL/uL (ref 4.22–5.81)
RDW: 14.7 % (ref 11.5–15.5)
WBC: 8.3 10*3/uL (ref 4.0–10.5)

## 2014-07-16 LAB — COMPREHENSIVE METABOLIC PANEL
ALK PHOS: 69 U/L (ref 39–117)
ALT: 16 U/L (ref 0–53)
AST: 19 U/L (ref 0–37)
Albumin: 3.9 g/dL (ref 3.5–5.2)
Anion gap: 9 (ref 5–15)
BILIRUBIN TOTAL: 1 mg/dL (ref 0.3–1.2)
BUN: 23 mg/dL (ref 6–23)
CO2: 22 mmol/L (ref 19–32)
Calcium: 9.3 mg/dL (ref 8.4–10.5)
Chloride: 105 mmol/L (ref 96–112)
Creatinine, Ser: 1.15 mg/dL (ref 0.50–1.35)
GFR calc Af Amer: 78 mL/min — ABNORMAL LOW (ref >90)
GFR calc non Af Amer: 67 mL/min — ABNORMAL LOW (ref >90)
Glucose, Bld: 104 mg/dL — ABNORMAL HIGH (ref 70–99)
POTASSIUM: 3.9 mmol/L (ref 3.5–5.1)
SODIUM: 136 mmol/L (ref 135–145)
TOTAL PROTEIN: 7.3 g/dL (ref 6.0–8.3)

## 2014-07-16 NOTE — Progress Notes (Signed)
Devin LeisureJames E Hockenberry presented for labwork. Labs per MD order drawn via Peripheral Line 21 gauge needle inserted in left AC  Good blood return present. Procedure without incident.  Needle removed intact. Patient tolerated procedure well.

## 2014-07-16 NOTE — Progress Notes (Signed)
Devin Castro CONSULT NOTE  Patient Care Team: Eartha Inch, MD as PCP - General (Family Medicine)  CHIEF COMPLAINTS/PURPOSE OF CONSULTATION:  Post operative DVT PE while therapeutic on coumadin Bilateral total knee replacement on 06/04/2014  HISTORY OF PRESENTING ILLNESS:  Devin Castro 64 y.o. male is here because of PE while on coumadin.  He underwent bilateral total knee replacements on January 19. He states his postoperative course was uncomplicated. He was on Coumadin at the time of discharge. His INR was therapeutic at 2.49. Home health was checking his INR. He notes his INR was elevated (greater than 5) and his Coumadin was held for 2 days. He states that it recheck his INR was 2. Several days later he reports developing shortness of breath and went to his primary care physician. He was then referred to [facility] that Friday and underwent a "lung scan." By report he was diagnosed with bilateral PE's.  He was transferred to the ED and admitted for further evaluation. The patient also notes he had lower extremity ultrasounds which were negative. He denies any prior history of DVT or PE. He denies any significant family history of DVT.   I was on-call this weekend he presented to the emergency department. We decided to change his anticoagulation and he was started on Lovenox then ultimately transition to Xarelto. He continues on Xarelto without major complaints.  He reported having "pink" urine approximately 2 weeks ago that resolved. He notes this past Sunday his urine looked like T. By Wednesday he noted pink urine again. He has recently seen a urologist and had a cystoscopy. Monday he had a CT scan and it was noted he had a kidney stone. He returns to urology in 1 month.  MEDICAL HISTORY:  Past Medical History  Diagnosis Date  . Hypertension   . COPD (chronic obstructive pulmonary disease)   . Vertigo   . Kidney stone   . Shortness of breath dyspnea   .  Arthritis   . PE (pulmonary embolism)     SURGICAL HISTORY: Past Surgical History  Procedure Laterality Date  . Kidney stone removal    . Cardiac catheterization    . Colonoscopy    . Total knee arthroplasty Bilateral 06/04/2014    Procedure: TOTAL KNEE BILATERAL;  Surgeon: Cammy Copa, MD;  Location: [facility];  Service: Orthopedics;  Laterality: Bilateral;    SOCIAL HISTORY: History   Social History  . Marital Status: Married    Spouse Name: N/A  . Number of Children: N/A  . Years of Education: N/A   Occupational History  . Not on file.   Social History Main Topics  . Smoking status: Former Smoker -- 20 years    Quit date: 08/16/2002  . Smokeless tobacco: Former Neurosurgeon  . Alcohol Use: No  . Drug Use: No  . Sexual Activity: Yes    Birth Control/ Protection: None   Other Topics Concern  . 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 He is married for 43 years he has 3 daughters and 5 grandsons. He used to smoke but quit in 2004 he started smoking at the age of 89. At his heaviest he smoked 3 or more packs per day. He denies any alcohol use.  FAMILY HISTORY: Family History  Problem Relation Age of Onset  . Cancer Mother   .  Diabetes Father   . Cancer Sister    indicated that his mother is deceased. He indicated that his father is deceased. He indicated that his sister is alive.  His mother died at 4584 from an aneurysm she had a history of breast cancer. His father died at 5446 from heart disease he had diabetes. He has 2 sisters one with a history of melanoma. No significant family history of malignancy or clotting disorders.  ALLERGIES:  has No Known Allergies.  MEDICATIONS:  Current Outpatient Prescriptions  Medication Sig Dispense Refill  . amLODipine (NORVASC) 5 MG tablet Take 5 mg by mouth daily.    Marland Kitchen. atorvastatin (LIPITOR) 20 MG tablet Take 20 mg by mouth  daily.    Marland Kitchen. losartan (COZAAR) 100 MG tablet Take 100 mg by mouth daily.    . metoprolol succinate (TOPROL-XL) 25 MG 24 hr tablet Take 25 mg by mouth daily.  3  . Mometasone Furo-Formoterol Fum 200-5 MCG/ACT AERO Inhale 1 puff into the lungs daily.     . Rivaroxaban (XARELTO STARTER PACK) 15 & 20 MG TBPK Take as directed on package: Start with one 15mg  tablet by mouth twice a day with food. On Day 22, switch to one 20mg  tablet once a day with food. 51 each 0  . VENTOLIN HFA 108 (90 BASE) MCG/ACT inhaler Inhale 2 puffs into the lungs every 4 (four) hours as needed.  0  . zolpidem (AMBIEN) 10 MG tablet Take 10 mg by mouth at bedtime as needed.    . docusate sodium (COLACE) 100 MG capsule Take 100 mg by mouth 2 (two) times daily.    . methocarbamol (ROBAXIN) 500 MG tablet Take 1 tablet (500 mg total) by mouth every 6 (six) hours as needed for muscle spasms. (Patient not taking: Reported on 07/16/2014) 30 tablet 0  . nebivolol (BYSTOLIC) 10 MG tablet Take 10 mg by mouth daily.    Marland Kitchen. oxyCODONE (OXY IR/ROXICODONE) 5 MG immediate release tablet Take 1-2 tablets (5-10 mg total) by mouth every 3 (three) hours as needed for breakthrough pain. (Patient not taking: Reported on 07/16/2014) 60 tablet 0  . OxyCODONE (OXYCONTIN) 10 mg T12A 12 hr tablet Take 1 tablet (10 mg total) by mouth every 12 (twelve) hours. (Patient not taking: Reported on 07/16/2014) 20 tablet 0   No current facility-administered medications for this visit.    Review of Systems  Constitutional: Positive for weight loss and malaise/fatigue.       15 pounds since surgery  HENT: Negative.   Eyes:       Wears glasses  Respiratory: Negative.   Cardiovascular: Negative.  Negative for chest pain, palpitations and orthopnea.       Occasional "feeling" across chest into arms  Gastrointestinal: Positive for nausea. Negative for diarrhea and constipation.       After surgery had decrease in appetite and stomach discomfort, finally improving  Has had  screening C scope, gets routinely history of polyps  Genitourinary: Negative.   Musculoskeletal: Positive for back pain.  Skin: Negative.   Neurological: Negative.   Endo/Heme/Allergies: Bruises/bleeds easily.  Psychiatric/Behavioral: Negative.     PHYSICAL EXAMINATION: ECOG PERFORMANCE STATUS: 0 - Asymptomatic  Filed Vitals:   07/16/14 1450  BP: 121/77  Pulse: 95  Temp: 98.3 F (36.8 C)  Resp: 18   Filed Weights   07/16/14 1450  Weight: 216 lb 6.4 oz (98.158 kg)     Physical Exam  Constitutional: He is oriented to person, place, and time and  well-developed, well-nourished, and in no distress.  HENT:  Head: Normocephalic and atraumatic.  Nose: Nose normal.  Mouth/Throat: Oropharynx is clear and moist. No oropharyngeal exudate.  Eyes: Conjunctivae and EOM are normal. Pupils are equal, round, and reactive to light. Right eye exhibits no discharge. Left eye exhibits no discharge. No scleral icterus.  Neck: Normal range of motion. Neck supple. No tracheal deviation present. No thyromegaly present.  Cardiovascular: Normal rate, regular rhythm and normal heart sounds.  Exam reveals no gallop and no friction rub.   No murmur heard. Pulmonary/Chest: Effort normal and breath sounds normal. He has no wheezes. He has no rales.  Abdominal: Soft. Bowel sounds are normal. He exhibits no distension and no mass. There is no tenderness. There is no rebound and no guarding.  Musculoskeletal: Normal range of motion. He exhibits no edema.  Bilateral incision sites are well healed.  Lymphadenopathy:    He has no cervical adenopathy.  Neurological: He is alert and oriented to person, place, and time. He has normal reflexes. No cranial nerve deficit. Gait normal. Coordination normal.  Skin: Skin is warm and dry. No rash noted.  Psychiatric: Mood, memory, affect and judgment normal.  Nursing note and vitals reviewed.    LABORATORY DATA:  I have reviewed the data as listed Lab Results    Component Value Date   WBC 8.3 07/16/2014   HGB 13.8 07/16/2014   HCT 41.8 07/16/2014   MCV 98.6 07/16/2014   PLT 284 07/16/2014     Chemistry      Component Value Date/Time   NA 136 07/16/2014 1630   K 3.9 07/16/2014 1630   CL 105 07/16/2014 1630   CO2 22 07/16/2014 1630   BUN 23 07/16/2014 1630   CREATININE 1.15 07/16/2014 1630      Component Value Date/Time   CALCIUM 9.3 07/16/2014 1630   ALKPHOS 69 07/16/2014 1630   AST 19 07/16/2014 1630   ALT 16 07/16/2014 1630   BILITOT 1.0 07/16/2014 1630      ASSESSMENT & PLAN:   Bilateral pulmonary emboli in the postoperative setting, therapeutic on Coumadin  64 year old male who underwent bilateral total knee replacements. He was discharged on Coumadin for prophylaxis, INR was therapeutic. He developed shortness of breath over the following week, documented INR levels were therapeutic. He was diagnosed with bilateral pulmonary emboli. He was ultimately switched to [facility] and is currently having no problems.  I recommended a thrombophilia evaluation. I had a long discussion with the patient about the potential reasons that Coumadin may have not protect him against pulmonary embolus. I advised him it is possible he may have been subtherapeutic at some point. Although based upon his history is seems unlikely.  I will plan on having him return to the clinic in one month to review the results. He had questions regarding duration of Xarelto therapy and I advised him this depends upon the results of his peripheral evaluation.  Orders Placed This Encounter  Procedures  . Antithrombin III  . Protein C activity  . Protein C, total  . Protein S activity  . Protein S, total  . Lupus anticoagulant panel  . Beta-2-glycoprotein i abs, IgG/M/A  . Homocysteine, serum  . Factor 5 leiden  . Prothrombin gene mutation  . Cardiolipin antibodies, IgG, IgM, IgA  . CBC with Differential  . Comprehensive metabolic panel  . PTT-LA Mix  .  Hexagonal Phase Phospholipid  . dRVVT Mix  . dRVVT Confirm    All questions were  answered. The patient knows to call the clinic with any problems, questions or concerns. This note was electronically signed.    Arvil Chaco, MD  08/05/2014 6:52 PM

## 2014-07-16 NOTE — Patient Instructions (Signed)
[facility] Discharge Instructions  RECOMMENDATIONS MADE BY THE CONSULTANT AND ANY TEST RESULTS WILL BE SENT TO YOUR REFERRING PHYSICIAN.  Exam and discussion by Dr. Galen ManilaPenland We need to do some tests to see if we can determine why you developed blood clots.  Follow-up in 1 month to discuss test results.  Thank you for choosing [facility] to provide your oncology and hematology care.  To afford each patient quality time with our provider, please arrive at least 15 minutes before your scheduled appointment time.    You need to re-schedule your appointment should you arrive 10 or more minutes late.  We strive to give you quality time with our providers, and arriving late affects you and other patients whose appointments are after yours.  Also, if you no show three or more times for appointments you may be dismissed from the clinic at the providers discretion.     Again, thank you for choosing [facility].  Our hope is that these requests will decrease the amount of time that you wait before being seen by our physicians.       _____________________________________________________________  Should you have questions after your visit to [facility], please contact our office at [phone] between the hours of 8:30 a.m. and 4:30 p.m.  Voicemails left after 4:30 p.m. will not be returned until the following business day.  For prescription refill requests, have your pharmacy contact our office.

## 2014-07-17 LAB — ANTITHROMBIN III: AntiThromb III Func: 118 % (ref 75–120)

## 2014-07-17 LAB — HOMOCYSTEINE: Homocysteine: 18.5 umol/L — ABNORMAL HIGH (ref 0.0–15.0)

## 2014-07-18 LAB — PROTEIN C, TOTAL: Protein C, Total: 117 % (ref 70–140)

## 2014-07-18 LAB — PROTEIN S ACTIVITY: Protein S Activity: 152 % — ABNORMAL HIGH (ref 60–145)

## 2014-07-18 LAB — BETA-2-GLYCOPROTEIN I ABS, IGG/M/A
Beta-2 Glyco I IgG: <9 GPI IgG units (ref 0–20)
Beta-2-Glycoprotein I IgA: 12 GPI IgA units (ref 0–25)
Beta-2-Glycoprotein I IgM: <9 GPI IgM units (ref 0–32)

## 2014-07-18 LAB — PROTEIN S, TOTAL: Protein S Ag, Total: 141 % (ref 58–150)

## 2014-07-18 LAB — DRVVT MIX: dRVVT Mix: 79.4 s — ABNORMAL HIGH (ref 0.0–45.4)

## 2014-07-18 LAB — LUPUS ANTICOAGULANT PANEL
DRVVT: 114.9 s — ABNORMAL HIGH (ref 0.0–55.1)
PTT Lupus Anticoagulant: 72.1 s — ABNORMAL HIGH (ref 0.0–50.0)

## 2014-07-18 LAB — HEXAGONAL PHASE PHOSPHOLIPID: HEXAGONAL PHASE PHOSPHOLIPID: 5.9 s (ref 0.0–8.0)

## 2014-07-18 LAB — PROTEIN C ACTIVITY: Protein C Activity: 179 % — ABNORMAL HIGH (ref 74–151)

## 2014-07-18 LAB — DRVVT CONFIRM: dRVVT Confirm: 1.3 ratio (ref 0.0–1.4)

## 2014-07-18 LAB — PTT-LA MIX: PTT-LA Mix: 61.3 s — ABNORMAL HIGH (ref 0.0–50.0)

## 2014-07-19 LAB — PROTHROMBIN GENE MUTATION

## 2014-07-19 LAB — FACTOR 5 LEIDEN

## 2014-07-19 LAB — CARDIOLIPIN ANTIBODIES, IGG, IGM, IGA: Anticardiolipin IgG: <9 GPL U/mL (ref 0–14)

## 2014-08-20 ENCOUNTER — Ambulatory Visit (HOSPITAL_COMMUNITY): Payer: Medicare Other | Admitting: Hematology & Oncology

## 2014-08-20 ENCOUNTER — Encounter (HOSPITAL_COMMUNITY): Payer: Medicare Other | Attending: Hematology & Oncology | Admitting: Hematology & Oncology

## 2014-08-20 ENCOUNTER — Encounter (HOSPITAL_COMMUNITY): Payer: Self-pay | Admitting: Hematology & Oncology

## 2014-08-20 VITALS — BP 132/86 | HR 74 | Temp 97.6°F | Resp 18 | Wt 211.9 lb

## 2014-08-20 DIAGNOSIS — J449 Chronic obstructive pulmonary disease, unspecified: Secondary | ICD-10-CM | POA: Insufficient documentation

## 2014-08-20 DIAGNOSIS — I10 Essential (primary) hypertension: Secondary | ICD-10-CM | POA: Diagnosis not present

## 2014-08-20 DIAGNOSIS — Z7901 Long term (current) use of anticoagulants: Secondary | ICD-10-CM | POA: Diagnosis not present

## 2014-08-20 DIAGNOSIS — I82409 Acute embolism and thrombosis of unspecified deep veins of unspecified lower extremity: Secondary | ICD-10-CM | POA: Insufficient documentation

## 2014-08-20 DIAGNOSIS — Z96659 Presence of unspecified artificial knee joint: Secondary | ICD-10-CM

## 2014-08-20 DIAGNOSIS — E7211 Homocystinuria: Secondary | ICD-10-CM

## 2014-08-20 DIAGNOSIS — I2699 Other pulmonary embolism without acute cor pulmonale: Secondary | ICD-10-CM | POA: Diagnosis not present

## 2014-08-20 MED ORDER — FOLIC ACID 1 MG PO TABS: 1.0000 mg | ORAL_TABLET | Freq: Every day | ORAL | Status: DC

## 2014-08-20 NOTE — Progress Notes (Signed)
[name] CONSULT NOTE  Patient Care Team: Eartha Inch, MD as PCP - General (Family Medicine)  CHIEF COMPLAINTS/PURPOSE OF CONSULTATION:  Post operative DVT PE while therapeutic on coumadin Bilateral total knee replacement on 06/04/2014  HISTORY OF PRESENTING ILLNESS:  Devin Castro 64 y.o. male is here because of PE while on coumadin.  He underwent bilateral total knee replacements on January 19. He states his postoperative course was uncomplicated. He was on Coumadin at the time of discharge. His INR was therapeutic at 2.49. Home health was checking his INR. He notes his INR was elevated (greater than 5) and his Coumadin was held for 2 days. He states that it recheck his INR was 2. Several days later he reports developing shortness of breath and went to his primary care physician. He was then referred to [facility] imaging that Friday and underwent a "lung scan." By report he was diagnosed with bilateral PE's.  He was transferred to the ED and admitted for further evaluation. The patient also notes he had lower extremity ultrasounds which were negative. He denies any prior history of DVT or PE. He denies any significant family history of DVT.   I was on-call this weekend he presented to the emergency department. We decided to change his anticoagulation and he was started on Lovenox then ultimately transition to Xarelto. He continues on Xarelto without major complaints.  He reported having "pink" urine approximately 2 weeks ago that resolved. He notes this past Sunday his urine looked like T. By Wednesday he noted pink urine again. He has recently seen a urologist and had a cystoscopy. Monday he had a CT scan and it was noted he had a kidney stone. He returns to urology in 1 month.  MEDICAL HISTORY:  Past Medical History  Diagnosis Date  . Hypertension   . COPD (chronic obstructive pulmonary disease)   . Vertigo   . Kidney stone   . Shortness of breath dyspnea   .  Arthritis   . PE (pulmonary embolism)     SURGICAL HISTORY: Past Surgical History  Procedure Laterality Date  . Kidney stone removal    . Cardiac catheterization    . Colonoscopy    . Total knee arthroplasty Bilateral 06/04/2014    Procedure: TOTAL KNEE BILATERAL;  Surgeon: Cammy Copa, MD;  Location: [facility];  Service: Orthopedics;  Laterality: Bilateral;    SOCIAL HISTORY: History   Social History  . Marital Status: Married    Spouse Name: N/A  . Number of Children: N/A  . Years of Education: N/A   Occupational History  . Not on file.   Social History Main Topics  . Smoking status: Former Smoker -- 20 years    Quit date: 08/16/2002  . Smokeless tobacco: Former Neurosurgeon  . Alcohol Use: No  . Drug Use: No  . Sexual Activity: Yes    Birth Control/ Protection: None   Other Topics Concern  . 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 He is married for 43 years he has 3 daughters and 5 grandsons. He used to smoke but quit in 2004 he started smoking at the age of 30. At his heaviest he smoked 3 or more packs per day. He denies any alcohol use.  FAMILY HISTORY: Family History  Problem Relation Age of Onset  . Cancer Mother   .  Diabetes Father   . Cancer Sister    indicated that his mother is deceased. He indicated that his father is deceased. He indicated that his sister is alive.  His mother died at 35 from an aneurysm she had a history of breast cancer. His father died at 17 from heart disease he had diabetes. He has 2 sisters one with a history of melanoma. No significant family history of malignancy or clotting disorders.  ALLERGIES:  has No Known Allergies.  MEDICATIONS:  Current Outpatient Prescriptions  Medication Sig Dispense Refill  . amLODipine (NORVASC) 5 MG tablet Take 5 mg by mouth daily.    Marland Kitchen atorvastatin (LIPITOR) 20 MG tablet Take 20 mg by mouth  daily.    Marland Kitchen docusate sodium (COLACE) 100 MG capsule Take 100 mg by mouth 2 (two) times daily.    Marland Kitchen losartan (COZAAR) 100 MG tablet Take 100 mg by mouth daily.    . methocarbamol (ROBAXIN) 500 MG tablet Take 1 tablet (500 mg total) by mouth every 6 (six) hours as needed for muscle spasms. (Patient not taking: Reported on 07/16/2014) 30 tablet 0  . metoprolol succinate (TOPROL-XL) 25 MG 24 hr tablet Take 25 mg by mouth daily.  3  . Mometasone Furo-Formoterol Fum 200-5 MCG/ACT AERO Inhale 1 puff into the lungs daily.     . nebivolol (BYSTOLIC) 10 MG tablet Take 10 mg by mouth daily.    Marland Kitchen oxyCODONE (OXY IR/ROXICODONE) 5 MG immediate release tablet Take 1-2 tablets (5-10 mg total) by mouth every 3 (three) hours as needed for breakthrough pain. (Patient not taking: Reported on 07/16/2014) 60 tablet 0  . OxyCODONE (OXYCONTIN) 10 mg T12A 12 hr tablet Take 1 tablet (10 mg total) by mouth every 12 (twelve) hours. (Patient not taking: Reported on 07/16/2014) 20 tablet 0  . Rivaroxaban (XARELTO STARTER PACK) 15 & 20 MG TBPK Take as directed on package: Start with one  tablet by mouth twice a day with food. On Day 22, switch to one  tablet once a day with food. 51 each 0  . VENTOLIN HFA 108 (90 BASE) MCG/ACT inhaler Inhale 2 puffs into the lungs every 4 (four) hours as needed.  0  . zolpidem (AMBIEN) 10 MG tablet Take 10 mg by mouth at bedtime as needed.     No current facility-administered medications for this visit.    ROS  PHYSICAL EXAMINATION: ECOG PERFORMANCE STATUS: 0 - Asymptomatic  There were no vitals filed for this visit. There were no vitals filed for this visit.   Physical Exam  Constitutional: He is oriented to person, place, and time and well-developed, well-nourished, and in no distress.  HENT:  Head: Normocephalic and atraumatic.  Nose: Nose normal.  Mouth/Throat: Oropharynx is clear and moist. No oropharyngeal exudate.  Eyes: Conjunctivae and EOM are normal. Pupils are equal,  round, and reactive to light. Right eye exhibits no discharge. Left eye exhibits no discharge. No scleral icterus.  Neck: Normal range of motion. Neck supple. No tracheal deviation present. No thyromegaly present.  Cardiovascular: Normal rate, regular rhythm and normal heart sounds.  Exam reveals no gallop and no friction rub.   No murmur heard. Pulmonary/Chest: Effort normal and breath sounds normal. He has no wheezes. He has no rales.  Abdominal: Soft. Bowel sounds are normal. He exhibits no distension and no mass. There is no tenderness. There is no rebound and no guarding.  Musculoskeletal: Normal range of motion. He exhibits no edema.  Bilateral incision sites are well  healed.  Lymphadenopathy:    He has no cervical adenopathy.  Neurological: He is alert and oriented to person, place, and time. He has normal reflexes. No cranial nerve deficit. Gait normal. Coordination normal.  Skin: Skin is warm and dry. No rash noted.  Psychiatric: Mood, memory, affect and judgment normal.  Nursing note and vitals reviewed.    LABORATORY DATA:  I have reviewed the data as listed Lab Results  Component Value Date   WBC 8.3 07/16/2014   HGB 13.8 07/16/2014   HCT 41.8 07/16/2014   MCV 98.6 07/16/2014   PLT 284 07/16/2014     Chemistry      Component Value Date/Time   NA 136 07/16/2014 1630   K 3.9 07/16/2014 1630   CL 105 07/16/2014 1630   CO2 22 07/16/2014 1630   BUN 23 07/16/2014 1630   CREATININE 1.15 07/16/2014 1630      Component Value Date/Time   CALCIUM 9.3 07/16/2014 1630   ALKPHOS 69 07/16/2014 1630   AST 19 07/16/2014 1630   ALT 16 07/16/2014 1630   BILITOT 1.0 07/16/2014 1630      ASSESSMENT & PLAN:   Bilateral pulmonary emboli in the postoperative setting, therapeutic on Coumadin  64 year old male who underwent bilateral total knee replacements. He was discharged on Coumadin for prophylaxis, INR was therapeutic. He developed shortness of breath over the following  week, documented INR levels were therapeutic. He was diagnosed with bilateral pulmonary emboli. He was ultimately switched to [facility] and is currently having no problems.  I recommended a thrombophilia evaluation. I had a long discussion with the patient about the potential reasons that Coumadin may have not protect him against pulmonary embolus. I advised him it is possible he may have been subtherapeutic at some point. Although based upon his history is seems unlikely.  I will plan on having him return to the clinic in one month to review the results. He had questions regarding duration of Xarelto therapy and I advised him this depends upon the results of his peripheral evaluation.  No orders of the defined types were placed in this encounter.    All questions were answered. The patient knows to call the clinic with any problems, questions or concerns. This note was electronically signed.    Arvil ChacoPenland,Romero Letizia Kristen, MD  08/20/2014 8:09 AM

## 2014-08-20 NOTE — Patient Instructions (Addendum)
[facility]  Discharge Instructions:  You saw Dr Galen ManilaPenland today.  Return in 6 weeks to see the doctor.   We are doing lab work today. We sent folic acid to your pharmacy dont forget to go pick it up. Get vitamin B6 10mg , B12 400 mcg, and B complex to take daily.   Please call the clinic if you have any questions or concerns.   _______________________________________________________________  Thank you for choosing [facility] to provide your oncology and hematology care.  To afford each patient quality time with our providers, please arrive at least 15 minutes before your scheduled appointment.  You need to re-schedule your appointment if you arrive 10 or more minutes late.  We strive to give you quality time with our providers, and arriving late affects you and other patients whose appointments are after yours.  Also, if you no show three or more times for appointments you may be dismissed from the clinic.  Again, thank you for choosing [facility] at [facility]. Our hope is that these requests will allow you access to exceptional care and in a timely manner. _______________________________________________________________  If you have questions after your visit, please contact our office at [phone] between the hours of 8:30 a.m. and 5:00 p.m. Voicemails left after 4:30 p.m. will not be returned until the following business day. _______________________________________________________________  For prescription refill requests, have your pharmacy contact our office. _______________________________________________________________  Recommendations made by the consultant and any test results will be sent to your referring physician. _______________________________________________________________

## 2014-08-20 NOTE — Progress Notes (Signed)
Devin Castro's reason for visit today is for labs as scheduled per MD orders.  Venipuncture performed with a 23 gauge butterfly needle to L Antecubital.  [name] tolerated procedure well and without incident; questions were answered and patient was discharged.

## 2014-08-21 LAB — FOLATE: FOLATE: 10 ng/mL

## 2014-08-21 LAB — VITAMIN B12: VITAMIN B 12: 326 pg/mL (ref 211–911)

## 2014-08-22 LAB — MISCELLANEOUS TEST

## 2014-08-22 LAB — HEXAGONAL PHASE PHOSPHOLIPID: Hexagonal Phase Phospholipid: 13 s — ABNORMAL HIGH (ref 0.0–8.0)

## 2014-08-22 LAB — LUPUS ANTICOAGULANT PANEL
DRVVT: 85.8 s — ABNORMAL HIGH (ref 0.0–55.1)
PTT LA: 54.6 s — AB (ref 0.0–50.0)

## 2014-08-22 LAB — DRVVT CONFIRM: dRVVT Confirm: 1.3 ratio (ref 0.0–1.4)

## 2014-08-22 LAB — DRVVT MIX: DRVVT MIX: 63.7 s — AB (ref 0.0–45.4)

## 2014-08-22 LAB — PTT-LA MIX: PTT-LA Mix: 50.4 s — ABNORMAL HIGH (ref 0.0–50.0)

## 2014-08-22 LAB — METHYLMALONIC ACID, SERUM: Methylmalonic Acid, Quantitative: 299 nmol/L (ref 0–378)

## 2014-08-26 ENCOUNTER — Other Ambulatory Visit (HOSPITAL_COMMUNITY): Payer: Self-pay

## 2014-08-26 DIAGNOSIS — I2699 Other pulmonary embolism without acute cor pulmonale: Secondary | ICD-10-CM

## 2014-10-01 ENCOUNTER — Encounter (HOSPITAL_COMMUNITY): Payer: Medicare Other | Attending: Hematology & Oncology | Admitting: Hematology & Oncology

## 2014-10-01 ENCOUNTER — Encounter (HOSPITAL_BASED_OUTPATIENT_CLINIC_OR_DEPARTMENT_OTHER): Payer: Medicare Other

## 2014-10-01 ENCOUNTER — Encounter (HOSPITAL_COMMUNITY): Payer: Self-pay | Admitting: Hematology & Oncology

## 2014-10-01 VITALS — BP 127/92 | HR 80 | Temp 97.8°F | Resp 18 | Wt 218.2 lb

## 2014-10-01 DIAGNOSIS — J449 Chronic obstructive pulmonary disease, unspecified: Secondary | ICD-10-CM | POA: Insufficient documentation

## 2014-10-01 DIAGNOSIS — D684 Acquired coagulation factor deficiency: Secondary | ICD-10-CM

## 2014-10-01 DIAGNOSIS — I2699 Other pulmonary embolism without acute cor pulmonale: Secondary | ICD-10-CM

## 2014-10-01 DIAGNOSIS — I82409 Acute embolism and thrombosis of unspecified deep veins of unspecified lower extremity: Secondary | ICD-10-CM | POA: Insufficient documentation

## 2014-10-01 DIAGNOSIS — Z7901 Long term (current) use of anticoagulants: Secondary | ICD-10-CM | POA: Insufficient documentation

## 2014-10-01 DIAGNOSIS — I10 Essential (primary) hypertension: Secondary | ICD-10-CM | POA: Insufficient documentation

## 2014-10-01 NOTE — Progress Notes (Signed)
[name] CONSULT NOTE  Patient Care Team: Eartha Inch, MD as PCP - General (Family Medicine)  CHIEF COMPLAINTS/PURPOSE OF CONSULTATION:  Post operative DVT PE while therapeutic on coumadin Bilateral total knee replacement on 06/04/2014 Heterozygous factor V Leiden mutation Abnormal DRVVT ? Secondary to XARELTO Elevated Homocysteine, started on B12 and folic acid  HISTORY OF PRESENTING ILLNESS:  Devin Castro 64 y.o. male is here because of PE while on coumadin.  He underwent bilateral total knee replacements on January 19. He states his postoperative course was uncomplicated. He was on Coumadin at the time of discharge. His INR was therapeutic at 2.49. Home health was checking his INR. He notes his INR was elevated (greater than 5) and his Coumadin was held for 2 days. He states that it recheck his INR was 2. Several days later he reports developing shortness of breath and went to his primary care physician. He was then referred to [facility] imaging that Friday and underwent a "lung scan." By report he was diagnosed with bilateral PE's.  He was transferred to the ED and admitted for further evaluation. The patient also notes he had lower extremity ultrasounds which were negative. He denies any prior history of DVT or PE. He denies any significant family history of DVT.   He is more active than before and doesn't have any complaints except for occasional SOB. He has been out golfing. He is pleased with his progress in regards to his knees, increased activity and breathing.  2 of his daughters were tested and were negative for Factor V Leiden. The other daughter hasn't been tested.  MEDICAL HISTORY:  Past Medical History  Diagnosis Date  . Hypertension   . COPD (chronic obstructive pulmonary disease)   . Vertigo   . Kidney stone   . Shortness of breath dyspnea   . Arthritis   . PE (pulmonary embolism)     SURGICAL HISTORY: Past Surgical History  Procedure  Laterality Date  . Kidney stone removal    . Cardiac catheterization    . Colonoscopy    . Total knee arthroplasty Bilateral 06/04/2014    Procedure: TOTAL KNEE BILATERAL;  Surgeon: Cammy Copa, MD;  Location: [facility] OR;  Service: Orthopedics;  Laterality: Bilateral;    SOCIAL HISTORY: History   Social History  . Marital Status: Married    Spouse Name: N/A  . Number of Children: N/A  . Years of Education: N/A   Occupational History  . Not on file.   Social History Main Topics  . Smoking status: Former Smoker -- 20 years    Quit date: 08/16/2002  . Smokeless tobacco: Former Neurosurgeon  . Alcohol Use: No  . Drug Use: No  . Sexual Activity: Yes    Birth Control/ Protection: None   Other Topics Concern  . 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 He is married for 43 years he has 3 daughters and 5 grandsons. He used to smoke but quit in 2004 he started smoking at the age of 40. At his heaviest he smoked 3 or more packs per day. He denies any alcohol use.  FAMILY HISTORY: Family History  Problem Relation Age of Onset  . Cancer Mother   . Diabetes Father   . Cancer Sister    indicated that his mother is deceased. He indicated that his father  is deceased. He indicated that his sister is alive.  His mother died at 8984 from an aneurysm she had a history of breast cancer. His father died at 3646 from heart disease he had diabetes. He has 2 sisters one with a history of melanoma. No significant family history of malignancy or clotting disorders.  ALLERGIES:  has No Known Allergies.  MEDICATIONS:  Current Outpatient Prescriptions  Medication Sig Dispense Refill  . amLODipine (NORVASC) 5 MG tablet Take 5 mg by mouth daily.    Marland Kitchen. atorvastatin (LIPITOR) 20 MG tablet Take 20 mg by mouth daily.    Marland Kitchen. b complex vitamins tablet Take 1 tablet by mouth daily.    . Cyanocobalamin (VITAMIN  B-12 PO) Take 1 tablet by mouth daily.    . folic acid (FOLVITE) 1 MG tablet Take 1 tablet (1 mg total) by mouth daily. 30 tablet 6  . losartan (COZAAR) 100 MG tablet Take 100 mg by mouth daily.    . metoprolol succinate (TOPROL-XL) 25 MG 24 hr tablet Take 25 mg by mouth daily.  3  . Mometasone Furo-Formoterol Fum 200-5 MCG/ACT AERO Inhale 1 puff into the lungs daily.     . Pyridoxine HCl (VITAMIN B-6 PO) Take 1 tablet by mouth.    Carlena Hurl. XARELTO 20 MG TABS tablet Take 20 mg by mouth daily with supper.   4  . docusate sodium (COLACE) 100 MG capsule Take 100 mg by mouth 2 (two) times daily.    . mometasone-formoterol (DULERA) 200-5 MCG/ACT AERO INHALE 2 PUFFS INTO THE LUNGS TWICE DAILY    . zolpidem (AMBIEN) 10 MG tablet Take 10 mg by mouth at bedtime as needed.     No current facility-administered medications for this visit.   Review of Systems  Constitutional: Negative for fever, chills, weight loss and malaise/fatigue.  HENT: Negative for congestion, hearing loss, nosebleeds, sore throat and tinnitus.   Eyes: Negative for blurred vision, double vision, pain and discharge.  Respiratory: Negative for cough, hemoptysis, sputum production, shortness of breath and wheezing.   Cardiovascular: Negative for chest pain, palpitations, claudication, leg swelling and PND.  Gastrointestinal: Negative for heartburn, nausea, vomiting, abdominal pain, diarrhea, constipation, blood in stool and melena.  Genitourinary: Negative for dysuria, urgency, frequency and hematuria.  Musculoskeletal: Negative for myalgias, joint pain and falls.  Skin: Negative for itching and rash.  Neurological: Negative for dizziness, tingling, tremors, sensory change, speech change, focal weakness, seizures, loss of consciousness, weakness and headaches.  Endo/Heme/Allergies: Does not bruise/bleed easily.  Psychiatric/Behavioral: Negative for depression, suicidal ideas, memory loss and substance abuse. The patient is not  nervous/anxious and does not have insomnia.   All other systems reviewed and are negative.  PHYSICAL EXAMINATION: ECOG PERFORMANCE STATUS: 0 - Asymptomatic  Filed Vitals:   10/01/14 1253  BP: 127/92  Pulse: 80  Temp: 97.8 F (36.6 C)  Resp: 18   Filed Weights   10/01/14 1253  Weight: 218 lb 3.2 oz (98.975 kg)   Physical Exam  Constitutional: He is oriented to person, place, and time and well-developed, well-nourished, and in no distress.  HENT:  Head: Normocephalic and atraumatic.  Nose: Nose normal.  Mouth/Throat: Oropharynx is clear and moist. No oropharyngeal exudate.  Eyes: Conjunctivae and EOM are normal. Pupils are equal, round, and reactive to light. Right eye exhibits no discharge. Left eye exhibits no discharge. No scleral icterus.  Neck: Normal range of motion. Neck supple. No tracheal deviation present. No thyromegaly present.  Cardiovascular: Normal rate, regular rhythm  and normal heart sounds.  Exam reveals no gallop and no friction rub.   No murmur heard. Pulmonary/Chest: Effort normal and breath sounds normal. He has no wheezes. He has no rales.  Abdominal: Soft. Bowel sounds are normal. He exhibits no distension and no mass. There is no tenderness. There is no rebound and no guarding.  Musculoskeletal: Normal range of motion. He exhibits no edema.  Bilateral incision sites are well healed.  Lymphadenopathy:    He has no cervical adenopathy.  Neurological: He is alert and oriented to person, place, and time. He has normal reflexes. No cranial nerve deficit. Gait normal. Coordination normal.  Skin: Skin is warm and dry. No rash noted.  Psychiatric: Mood, memory, affect and judgment normal.  Nursing note and vitals reviewed.    LABORATORY DATA:  I have reviewed the data as listed Lab Results  Component Value Date   WBC 8.3 07/16/2014   HGB 13.8 07/16/2014   HCT 41.8 07/16/2014   MCV 98.6 07/16/2014   PLT 284 07/16/2014     Chemistry      Component  Value Date/Time   NA 136 07/16/2014 1630   K 3.9 07/16/2014 1630   CL 105 07/16/2014 1630   CO2 22 07/16/2014 1630   BUN 23 07/16/2014 1630   CREATININE 1.15 07/16/2014 1630      Component Value Date/Time   CALCIUM 9.3 07/16/2014 1630   ALKPHOS 69 07/16/2014 1630   AST 19 07/16/2014 1630   ALT 16 07/16/2014 1630   BILITOT 1.0 07/16/2014 1630      ASSESSMENT & PLAN:   Bilateral pulmonary emboli in the postoperative setting, while therapeutic on Coumadin Factor V leiden heterozygosity  64 year old male who underwent bilateral total knee replacements. He was discharged on Coumadin for prophylaxis, INR was therapeutic. He developed shortness of breath over the following week, documented INR levels were therapeutic. He was diagnosed with bilateral pulmonary emboli. He was ultimately switched to [facility] and is currently having no problems.   Orders Placed This Encounter  Procedures  . Lupus anticoagulant panel    Standing Status: Future     Number of Occurrences:      Standing Expiration Date: 10/01/2015  . CBC with Differential    Standing Status: Future     Number of Occurrences:      Standing Expiration Date: 10/01/2015   He has been diagnosed with factor V Leiden heterozygosity. Since his knee replacement he has become very ambulatory and active. I suspect that the abnormalities with his lupus anticoagulant may be secondary to Xarelto therapy. There is evidence of this in the literature and I need to look into this further. We will recheck a lupus anticoagulant panel again in approximately 12 weeks. We will also consider repeating it 24 hours after discontinuation of Xarelto. I think it is unlikely that he has both factor V Leiden heterozygosity and a lupus anticoagulant. In regards to duration of therapy I advised the patient we will readdress this at follow-up.  This was an episode of "provoked" DVT/PE in the setting of a low risk thrombophilia. Therefore discontinuation of his  anticoagulant at 6 months may be reasonable. We can certainly check a d-dimer after discontinuation of therapy to assess for possibility of re-thrombosis.   All questions were answered. The patient knows to call the clinic with any problems, questions or concerns. This note was electronically signed.    This document serves as a record of services personally performed by Loma Messing, MD. It  was created on her behalf by Danelle BerryHarry Ramsamooj, a trained medical scribe. The creation of this record is based on the scribe's personal observations and the provider's statements to them. This document has been checked and approved by the attending provider.    I have reviewed the above documentation for accuracy and completeness, and I agree with the above.  Novella OliveShannon K. Ivie Savitt MD

## 2014-10-01 NOTE — Patient Instructions (Signed)
[facility] at [facility] Discharge Instructions  RECOMMENDATIONS MADE BY THE CONSULTANT AND ANY TEST RESULTS WILL BE SENT TO YOUR REFERRING PHYSICIAN.  Exam and discussion by Dr. Galen ManilaPenland Will need to redraw your Lupus anticoagulant panel at the end of June.  If it's still positive we will be sending you to [facility].  Follow-up here in 6 months.  Thank you for choosing [facility] at [facility] to provide your oncology and hematology care.  To afford each patient quality time with our provider, please arrive at least 15 minutes before your scheduled appointment time.    You need to re-schedule your appointment should you arrive 10 or more minutes late.  We strive to give you quality time with our providers, and arriving late affects you and other patients whose appointments are after yours.  Also, if you no show three or more times for appointments you may be dismissed from the clinic at the providers discretion.     Again, [facility] you for choosing [facility].  Our hope is that these requests will decrease the amount of time that you wait before being seen by our physicians.       _____________________________________________________________  Should you have questions after your visit to [facility], please contact our office at [phone] between the hours of 8:30 a.m. and 4:30 p.m.  Voicemails left after 4:30 p.m. will not be returned until the following business day.  For prescription refill requests, have your pharmacy contact our office.

## 2014-10-02 NOTE — Progress Notes (Signed)
LABS DRAWN

## 2014-10-10 LAB — MISCELLANEOUS TEST

## 2014-11-01 ENCOUNTER — Other Ambulatory Visit (HOSPITAL_COMMUNITY): Payer: Self-pay

## 2014-11-13 ENCOUNTER — Encounter (HOSPITAL_COMMUNITY): Payer: Medicare Other | Attending: Hematology & Oncology

## 2014-11-13 DIAGNOSIS — I82409 Acute embolism and thrombosis of unspecified deep veins of unspecified lower extremity: Secondary | ICD-10-CM | POA: Insufficient documentation

## 2014-11-13 DIAGNOSIS — J449 Chronic obstructive pulmonary disease, unspecified: Secondary | ICD-10-CM | POA: Insufficient documentation

## 2014-11-13 DIAGNOSIS — Z7901 Long term (current) use of anticoagulants: Secondary | ICD-10-CM | POA: Insufficient documentation

## 2014-11-13 DIAGNOSIS — I10 Essential (primary) hypertension: Secondary | ICD-10-CM | POA: Insufficient documentation

## 2014-11-13 DIAGNOSIS — I2699 Other pulmonary embolism without acute cor pulmonale: Secondary | ICD-10-CM | POA: Diagnosis not present

## 2014-11-13 LAB — CBC WITH DIFFERENTIAL/PLATELET
BASOS ABS: 0 10*3/uL (ref 0.0–0.1)
BASOS PCT: 0 % (ref 0–1)
Eosinophils Absolute: 0.3 10*3/uL (ref 0.0–0.7)
Eosinophils Relative: 3 % (ref 0–5)
HCT: 47.5 % (ref 39.0–52.0)
Hemoglobin: 16.3 g/dL (ref 13.0–17.0)
Lymphocytes Relative: 26 % (ref 12–46)
Lymphs Abs: 2.7 10*3/uL (ref 0.7–4.0)
MCH: 33.3 pg (ref 26.0–34.0)
MCHC: 34.3 g/dL (ref 30.0–36.0)
MCV: 96.9 fL (ref 78.0–100.0)
Monocytes Absolute: 0.8 10*3/uL (ref 0.1–1.0)
Monocytes Relative: 8 % (ref 3–12)
NEUTROS PCT: 62 % (ref 43–77)
Neutro Abs: 6.3 10*3/uL (ref 1.7–7.7)
Platelets: 239 10*3/uL (ref 150–400)
RBC: 4.9 MIL/uL (ref 4.22–5.81)
RDW: 14.5 % (ref 11.5–15.5)
WBC: 10.1 10*3/uL (ref 4.0–10.5)

## 2014-11-14 NOTE — Progress Notes (Signed)
LABS DRAWN

## 2014-11-15 LAB — DRVVT CONFIRM: dRVVT Confirm: 1.4 ratio (ref 0.0–1.4)

## 2014-11-15 LAB — LUPUS ANTICOAGULANT PANEL
DRVVT: 99.9 s — ABNORMAL HIGH (ref 0.0–55.1)
PTT Lupus Anticoagulant: 62.2 s — ABNORMAL HIGH (ref 0.0–50.0)

## 2014-11-15 LAB — PTT-LA MIX: PTT-LA MIX: 56.2 s — AB (ref 0.0–50.0)

## 2014-11-15 LAB — HEXAGONAL PHASE PHOSPHOLIPID: Hexagonal Phase Phospholipid: 12 s — ABNORMAL HIGH (ref 0.0–8.0)

## 2014-11-15 LAB — DRVVT MIX: DRVVT MIX: 69.9 s — AB (ref 0.0–45.4)

## 2015-02-28 ENCOUNTER — Telehealth (HOSPITAL_COMMUNITY): Payer: Self-pay | Admitting: Hematology & Oncology

## 2015-02-28 NOTE — Telephone Encounter (Signed)
PC FROM PT STATING HE HAD A NOSE BLEED THIS MORNING AND 2 DAYS AGO HE FELT A TINGLING FEELING IN HIS CHEST AND ARMS.  PER DR PENLAND/TOM K PT NEEDS TO HOLD XARELTO FOR TODAY AND RESTART 03/01/15 AND  IF HAS TINGLING FEELING AGAIN TO GO TO THE NEAREST ER.

## 2015-03-12 ENCOUNTER — Other Ambulatory Visit (HOSPITAL_COMMUNITY): Payer: Self-pay | Admitting: Hematology & Oncology

## 2015-04-03 ENCOUNTER — Ambulatory Visit (HOSPITAL_COMMUNITY): Payer: Medicare Other | Admitting: Hematology & Oncology

## 2015-04-04 ENCOUNTER — Encounter (HOSPITAL_COMMUNITY): Payer: Self-pay | Admitting: Hematology & Oncology

## 2015-04-04 ENCOUNTER — Encounter (HOSPITAL_COMMUNITY): Payer: Medicare Other | Attending: Hematology & Oncology | Admitting: Hematology & Oncology

## 2015-04-04 VITALS — BP 151/97 | HR 64 | Temp 97.5°F | Resp 18 | Wt 215.7 lb

## 2015-04-04 DIAGNOSIS — I2699 Other pulmonary embolism without acute cor pulmonale: Secondary | ICD-10-CM | POA: Diagnosis not present

## 2015-04-04 DIAGNOSIS — Z7901 Long term (current) use of anticoagulants: Secondary | ICD-10-CM

## 2015-04-04 NOTE — Patient Instructions (Signed)
..  [facility] at [facility] Discharge Instructions  RECOMMENDATIONS MADE BY THE CONSULTANT AND ANY TEST RESULTS WILL BE SENT TO YOUR REFERRING PHYSICIAN.  Labs in December June to see the doctor   Thank you for choosing [facility] at [facility] to provide your oncology and hematology care.  To afford each patient quality time with our provider, please arrive at least 15 minutes before your scheduled appointment time.    You need to re-schedule your appointment should you arrive 10 or more minutes late.  We strive to give you quality time with our providers, and arriving late affects you and other patients whose appointments are after yours.  Also, if you no show three or more times for appointments you may be dismissed from the clinic at the providers discretion.     Again, thank you for choosing [facility].  Our hope is that these requests will decrease the amount of time that you wait before being seen by our physicians.       _____________________________________________________________  Should you have questions after your visit to [facility], please contact our office at [phone] between the hours of 8:30 a.m. and 4:30 p.m.  Voicemails left after 4:30 p.m. will not be returned until the following business day.  For prescription refill requests, have your pharmacy contact our office.

## 2015-04-04 NOTE — Progress Notes (Signed)
[name] PROGRESS NOTE  Patient Care Team: Eartha Inch, MD as PCP - General (Family Medicine)  CHIEF COMPLAINTS/PURPOSE OF CONSULTATION:  Post operative DVT PE while therapeutic on coumadin Bilateral total knee replacement on 06/04/2014 Heterozygous factor V Leiden mutation Abnormal DRVVT ? Secondary to XARELTO Elevated Homocysteine, started on B12 and folic acid  HISTORY OF PRESENTING ILLNESS:  Devin Castro 64 y.o. male is here because of PE while on coumadin.  He underwent bilateral total knee replacements on January 19. He states his postoperative course was uncomplicated. He was on Coumadin at the time of discharge. His INR was therapeutic at 2.49. Home health was checking his INR. He notes his INR was elevated (greater than 5) and his Coumadin was held for 2 days. He states that it recheck his INR was 2. Several days later he reports developing shortness of breath and went to his primary care physician. He was then referred to [facility] imaging that Friday and underwent a "lung scan." By report he was diagnosed with bilateral PE's.  He was transferred to the ED and admitted for further evaluation. The patient also notes he had lower extremity ultrasounds which were negative. He denies any prior history of DVT or PE. He denies any significant family history of DVT.   He is more active than before and doesn't have any complaints except for occasional SOB. He has been out golfing. He is pleased with his progress in regards to his knees, increased activity and breathing.  2 of his daughters were tested and were negative for Factor V Leiden. The other daughter hasn't been tested.   He denies nausea and vomiting, constipation, diarrhea, change in appetite or energy level. He denies any unusual bleeding or bruising.  MEDICAL HISTORY:  Past Medical History  Diagnosis Date  . Hypertension   . COPD (chronic obstructive pulmonary disease) (HCC)   . Vertigo   . Kidney  stone   . Shortness of breath dyspnea   . Arthritis   . PE (pulmonary embolism)     SURGICAL HISTORY: Past Surgical History  Procedure Laterality Date  . Kidney stone removal    . Cardiac catheterization    . Colonoscopy    . Total knee arthroplasty Bilateral 06/04/2014    Procedure: TOTAL KNEE BILATERAL;  Surgeon: Cammy Copa, MD;  Location: [facility] OR;  Service: Orthopedics;  Laterality: Bilateral;    SOCIAL HISTORY: Social History   Social History  . Marital Status: Married    Spouse Name: N/A  . Number of Children: N/A  . Years of Education: N/A   Occupational History  . Not on file.   Social History Main Topics  . Smoking status: Former Smoker -- 20 years    Quit date: 08/16/2002  . Smokeless tobacco: Former Neurosurgeon  . Alcohol Use: No  . Drug Use: No  . Sexual Activity: Yes    Birth Control/ Protection: None   Other Topics Concern  . 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 He is married for 43 years he has 3 daughters and 5 grandsons. He used to smoke but quit in 2004 he started smoking at the age of 74. At his heaviest he smoked 3 or more packs per day. He denies any alcohol use.  FAMILY HISTORY: Family History  Problem Relation Age of Onset  . Cancer Mother   .  Diabetes Father   . Cancer Sister    indicated that his mother is deceased. He indicated that his father is deceased. He indicated that his sister is alive.  His mother died at 33 from an aneurysm she had a history of breast cancer. His father died at 29 from heart disease he had diabetes. He has 2 sisters one with a history of melanoma. No significant family history of malignancy or clotting disorders.  ALLERGIES:  has No Known Allergies.  MEDICATIONS:  Current Outpatient Prescriptions  Medication Sig Dispense Refill  . amLODipine (NORVASC) 5 MG tablet Take 5 mg by mouth daily.    Marland Kitchen  atorvastatin (LIPITOR) 20 MG tablet Take 20 mg by mouth daily.    Marland Kitchen b complex vitamins tablet Take 1 tablet by mouth daily.    . Cyanocobalamin (VITAMIN B-12 PO) Take 1 tablet by mouth daily.    . folic acid (FOLVITE) 1 MG tablet TAKE 1 TABLET BY MOUTH EVERY DAY 30 tablet 6  . losartan (COZAAR) 100 MG tablet Take 100 mg by mouth daily.    . metoprolol succinate (TOPROL-XL) 25 MG 24 hr tablet Take 25 mg by mouth daily.  3  . psyllium (METAMUCIL) 58.6 % powder Take 1 packet by mouth 3 (three) times daily.    . Pyridoxine HCl (VITAMIN B-6 PO) Take 1 tablet by mouth.    Carlena Hurl 20 MG TABS tablet Take 20 mg by mouth daily with supper.   4  . docusate sodium (COLACE) 100 MG capsule Take 100 mg by mouth 2 (two) times daily.    . Mometasone Furo-Formoterol Fum 200-5 MCG/ACT AERO Inhale 1 puff into the lungs daily.     . mometasone-formoterol (DULERA) 200-5 MCG/ACT AERO INHALE 2 PUFFS INTO THE LUNGS TWICE DAILY     No current facility-administered medications for this visit.   Review of Systems  Constitutional: Negative for fever, chills, weight loss and malaise/fatigue.  HENT: Negative for congestion, hearing loss, nosebleeds, sore throat and tinnitus.   Eyes: Negative for blurred vision, double vision, pain and discharge.  Respiratory: Negative for cough, hemoptysis, sputum production, shortness of breath and wheezing.   Cardiovascular: Negative for chest pain, palpitations, claudication, leg swelling and PND.  Gastrointestinal: Negative for heartburn, nausea, vomiting, abdominal pain, diarrhea, constipation, blood in stool and melena.  Genitourinary: Negative for dysuria, urgency, frequency and hematuria.  Musculoskeletal: Negative for myalgias, joint pain and falls.  Skin: Negative for itching and rash.  Neurological: Negative for dizziness, tingling, tremors, sensory change, speech change, focal weakness, seizures, loss of consciousness, weakness and headaches.  Endo/Heme/Allergies: Does not  bruise/bleed easily.  Psychiatric/Behavioral: Negative for depression, suicidal ideas, memory loss and substance abuse. The patient is not nervous/anxious and does not have insomnia.    14 point review of systems was performed and is negative except as detailed under history of present illness and above   PHYSICAL EXAMINATION: ECOG PERFORMANCE STATUS: 0 - Asymptomatic  Filed Vitals:   04/04/15 1206  BP: 151/97  Pulse: 64  Temp: 97.5 F (36.4 C)  Resp: 18   Filed Weights   04/04/15 1206  Weight: 215 lb 11.2 oz (97.841 kg)   Physical Exam  Constitutional: He is oriented to person, place, and time and well-developed, well-nourished, and in no distress.  HENT:  Head: Normocephalic and atraumatic.  Nose: Nose normal.  Mouth/Throat: Oropharynx is clear and moist. No oropharyngeal exudate.  Eyes: Conjunctivae and EOM are normal. Pupils are equal, round, and reactive to light.  Right eye exhibits no discharge. Left eye exhibits no discharge. No scleral icterus.  Neck: Normal range of motion. Neck supple. No tracheal deviation present. No thyromegaly present.  Cardiovascular: Normal rate, regular rhythm and normal heart sounds.  Exam reveals no gallop and no friction rub.   No murmur heard. Pulmonary/Chest: Effort normal and breath sounds normal. He has no wheezes. He has no rales.  Abdominal: Soft. Bowel sounds are normal. He exhibits no distension and no mass. There is no tenderness. There is no rebound and no guarding.  Musculoskeletal: Normal range of motion. He exhibits no edema.   Lymphadenopathy:    He has no cervical adenopathy.  Neurological: He is alert and oriented to person, place, and time. He has normal reflexes. No cranial nerve deficit. Gait normal. Coordination normal.  Skin: Skin is warm and dry. No rash noted.  Psychiatric: Mood, memory, affect and judgment normal.  Nursing note and vitals reviewed.   LABORATORY DATA:  I have reviewed the data as listed Lab  Results  Component Value Date   WBC 10.1 11/13/2014   HGB 16.3 11/13/2014   HCT 47.5 11/13/2014   MCV 96.9 11/13/2014   PLT 239 11/13/2014     Chemistry      Component Value Date/Time   NA 136 07/16/2014 1630   K 3.9 07/16/2014 1630   CL 105 07/16/2014 1630   CO2 22 07/16/2014 1630   BUN 23 07/16/2014 1630   CREATININE 1.15 07/16/2014 1630      Component Value Date/Time   CALCIUM 9.3 07/16/2014 1630   ALKPHOS 69 07/16/2014 1630   AST 19 07/16/2014 1630   ALT 16 07/16/2014 1630   BILITOT 1.0 07/16/2014 1630      ASSESSMENT & PLAN:   Bilateral pulmonary emboli in the postoperative setting, while therapeutic on Coumadin Factor V leiden heterozygosity  64 year old male who underwent bilateral total knee replacements. He was discharged on Coumadin for prophylaxis, INR was therapeutic. He developed shortness of breath over the following week, documented INR levels were therapeutic. He was diagnosed with bilateral pulmonary emboli. [facility] was ultimately [facility] and is currently having no problems.  He has been diagnosed with factor V Leiden heterozygosity. Since his knee replacement he has become very ambulatory and active. I suspect that the abnormalities with his lupus anticoagulant may be secondary to Xarelto therapy. There is evidence of this in the literature. We will also repeat it after discontinuation of Xarelto. I think it is unlikely that he has both factor V Leiden heterozygosity and a lupus anticoagulant. In regards to duration of therapy 6 months should be adequate.  This was an episode of "provoked" DVT/PE in the setting of a low risk thrombophilia. Therefore discontinuation of his anticoagulant at 6 months may be reasonable. We can certainly check a d-dimer after discontinuation of therapy to assess for possibility of re-thrombosis.   All questions were answered. The patient knows to call the clinic with any problems, questions or concerns.   This document  serves as a record of services personally performed by Loma MessingShannon Penland, MD. It was created on her behalf by Peggye FothergillKatherine Galloway, a trained medical scribe. The creation of this record is based on the scribe's personal observations and the provider's statements to them. This document has been checked and approved by the attending provider.  I have reviewed the above documentation for accuracy and completeness, and I agree with the above.  Novella OliveShannon K. Penland MD

## 2015-04-30 ENCOUNTER — Encounter (HOSPITAL_COMMUNITY): Payer: Medicare Other | Attending: Hematology & Oncology

## 2015-04-30 DIAGNOSIS — I2699 Other pulmonary embolism without acute cor pulmonale: Secondary | ICD-10-CM

## 2015-04-30 LAB — D-DIMER, QUANTITATIVE (NOT AT ARMC): D DIMER QUANT: 0.54 ug{FEU}/mL — AB (ref 0.00–0.50)

## 2015-05-01 LAB — LUPUS ANTICOAGULANT
DPT: 47.7 s (ref 0.0–55.0)
DRVVT: 39.1 s (ref 0.0–44.0)
PTT LA: 40.1 s (ref 0.0–40.6)
THROMBIN TIME: 14.8 s (ref 0.0–20.9)
dPT Confirm Ratio: 0.9 Ratio (ref 0.00–1.40)

## 2015-05-01 NOTE — Progress Notes (Signed)
LABS DRAWN

## 2015-05-22 ENCOUNTER — Encounter: Payer: Self-pay | Admitting: Hematology & Oncology

## 2015-05-28 DIAGNOSIS — Z96653 Presence of artificial knee joint, bilateral: Secondary | ICD-10-CM | POA: Diagnosis not present

## 2015-07-16 DIAGNOSIS — J4520 Mild intermittent asthma, uncomplicated: Secondary | ICD-10-CM | POA: Diagnosis not present

## 2015-07-16 DIAGNOSIS — I10 Essential (primary) hypertension: Secondary | ICD-10-CM | POA: Diagnosis not present

## 2015-07-16 DIAGNOSIS — E785 Hyperlipidemia, unspecified: Secondary | ICD-10-CM | POA: Diagnosis not present

## 2015-07-16 DIAGNOSIS — Z125 Encounter for screening for malignant neoplasm of prostate: Secondary | ICD-10-CM | POA: Diagnosis not present

## 2015-10-22 ENCOUNTER — Encounter (HOSPITAL_COMMUNITY): Payer: PPO

## 2015-10-22 ENCOUNTER — Encounter (HOSPITAL_COMMUNITY): Payer: PPO | Attending: Hematology & Oncology | Admitting: Hematology & Oncology

## 2015-10-22 VITALS — BP 134/88 | HR 68 | Temp 97.6°F | Resp 20 | Ht 68.0 in | Wt 222.1 lb

## 2015-10-22 DIAGNOSIS — Z7901 Long term (current) use of anticoagulants: Secondary | ICD-10-CM | POA: Insufficient documentation

## 2015-10-22 DIAGNOSIS — I10 Essential (primary) hypertension: Secondary | ICD-10-CM | POA: Diagnosis not present

## 2015-10-22 DIAGNOSIS — D6851 Activated protein C resistance: Secondary | ICD-10-CM | POA: Diagnosis not present

## 2015-10-22 DIAGNOSIS — Z87891 Personal history of nicotine dependence: Secondary | ICD-10-CM | POA: Insufficient documentation

## 2015-10-22 DIAGNOSIS — M1990 Unspecified osteoarthritis, unspecified site: Secondary | ICD-10-CM | POA: Insufficient documentation

## 2015-10-22 DIAGNOSIS — Z86711 Personal history of pulmonary embolism: Secondary | ICD-10-CM | POA: Insufficient documentation

## 2015-10-22 DIAGNOSIS — Z9889 Other specified postprocedural states: Secondary | ICD-10-CM | POA: Insufficient documentation

## 2015-10-22 DIAGNOSIS — Z79899 Other long term (current) drug therapy: Secondary | ICD-10-CM | POA: Diagnosis not present

## 2015-10-22 DIAGNOSIS — J449 Chronic obstructive pulmonary disease, unspecified: Secondary | ICD-10-CM | POA: Diagnosis not present

## 2015-10-22 DIAGNOSIS — Z96653 Presence of artificial knee joint, bilateral: Secondary | ICD-10-CM | POA: Diagnosis not present

## 2015-10-22 DIAGNOSIS — I2699 Other pulmonary embolism without acute cor pulmonale: Secondary | ICD-10-CM | POA: Diagnosis not present

## 2015-10-22 NOTE — Patient Instructions (Signed)
[facility] at [facility] Discharge Instructions  RECOMMENDATIONS MADE BY THE CONSULTANT AND ANY TEST RESULTS WILL BE SENT TO YOUR REFERRING PHYSICIAN.  We are repeating labs today - if they are ok - you don't have to return to see us  If they are still abnormal - we will have you come back  Thank you for choosing [facility] at [facility] to provide your oncology and hematology care.  To afford each patient quality time with our provider, please arrive at least 15 minutes before your scheduled appointment time.   Beginning January 23rd 2017 lab [facility] will be done in the  Main lab at [facility] on 1st floor. If you have a lab appointment with the Cancer Center please come in thru the  Main Entrance and check in at the main information desk  You need to re-schedule your appointment should you arrive 10 or more minutes late.  We strive to give you quality time with our providers, and arriving late affects you and other patients whose appointments are after yours.  Also, if you no show three or more times for appointments you may be dismissed from the clinic at the providers discretion.     Again, thank you for choosing [facility].  Our hope is that these requests will decrease the amount of time that you wait before being seen by our physicians.       _____________________________________________________________  Should you have questions after your visit to [facility], please contact our office at [phone] between the hours of 8:30 a.m. and 4:30 p.m.  Voicemails left after 4:30 p.m. will not be returned until the following business day.  For prescription refill requests, have your pharmacy contact our office.         Resources For Cancer Patients and their Caregivers ? American Cancer Society: Can assist with transportation, wigs, general needs, runs Look Good Feel Better.         571-856-09981-781-525-2223 ? Cancer Care: Provides financial assistance, online support groups, medication/co-pay assistance.  1-800-813-HOPE (978)721-6386(4673) ? [facility] Assists [place] cancer patients and their families through emotional , educational and financial support.  (865) 680-1441360-166-8264 ? [place] DSS Where to apply for food stamps, Medicaid and utility assistance. 514 647 2761937-591-5771 ? [facility]: Transportation to medical appointments. 508-358-2715(681)084-0321 ? Social Security Administration: May apply for disability if have a Stage IV cancer. 5870776579616-274-5729 (916)378-10021-754-076-4939 ? [place] [employer], [employer] and Transit Services: Assists with nutrition, care and transit needs. (513) 349-5703(820)301-8444  Cancer Center Support Programs: @10RELATIVEDAYS @ > Cancer Support Group  2nd Tuesday of the month 1pm-2pm, Journey Room  > Creative Journey  3rd Tuesday of the month 1130am-1pm, Journey Room  > Look Good Feel Better  1st Wednesday of the month 10am-12 noon, Journey Room (Call American Cancer Society to register (516) [phone])

## 2015-10-22 NOTE — Progress Notes (Signed)
[facility] PROGRESS NOTE  Patient Care Team: Eartha Inch, MD as PCP - General (Family Medicine)  CHIEF COMPLAINTS/PURPOSE OF CONSULTATION:  Post operative DVT PE while therapeutic on coumadin Bilateral total knee replacement on 06/04/2014 Heterozygous factor V Leiden mutation Abnormal DRVVT ? Secondary to XARELTO Elevated Homocysteine, started on B12 and folic acid  HISTORY OF PRESENTING ILLNESS:  Devin Castro 65 y.o. male is here for follow up of factor V leiden mutation. He had a PE while on coumadin after knee replacement.  He underwent bilateral total knee replacements on June 04 2014.He completed 6 months of XARELTO. He has been off of XARELTO for over 6 months and doing well.  Devin Castro is accompanied by his wife today.   He is back to golfing and feeling normal. He feels he is fine without the Xarelto.   His breathing has been good, however he notes have a cold over the past couple weeks. He took Nyquil which has helped with the congestion. His breathing is improved today. His appetite is excellent. No CP. Activity is overall back at baseline.  He has regular colonoscopies, admitting he is due for one sometime this year.  MEDICAL HISTORY:  Past Medical History  Diagnosis Date  . Hypertension   . COPD (chronic obstructive pulmonary disease) (HCC)   . Vertigo   . Kidney stone   . Shortness of breath dyspnea   . Arthritis   . PE (pulmonary embolism)     SURGICAL HISTORY: Past Surgical History  Procedure Laterality Date  . Kidney stone removal    . Cardiac catheterization    . Colonoscopy    . Total knee arthroplasty Bilateral 06/04/2014    Procedure: TOTAL KNEE BILATERAL;  Surgeon: Cammy Copa, MD;  Location: [facility] OR;  Service: Orthopedics;  Laterality: Bilateral;    SOCIAL HISTORY: Social History   Social History  . Marital Status: Married    Spouse Name: N/A  . Number of Children: N/A  . Years of Education: N/A   Occupational  History  . Not on file.   Social History Main Topics  . Smoking status: Former Smoker -- 20 years    Quit date: 08/16/2002  . Smokeless tobacco: Former Neurosurgeon  . Alcohol Use: No  . Drug Use: No  . Sexual Activity: Yes    Birth Control/ Protection: None   Other Topics Concern  . 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 He is married for 43 years he has 3 daughters and 5 grandsons. He used to smoke but quit in 2004 he started smoking at the age of 26. At his heaviest he smoked 3 or more packs per day. He denies any alcohol use.  FAMILY HISTORY: Family History  Problem Relation Age of Onset  . Cancer Mother   . Diabetes Father   . Cancer Sister    indicated that his mother is deceased. He indicated that his father is deceased. He indicated that his sister is alive.  His mother died at 11 from an aneurysm she had a history of breast cancer. His father died at 32 from heart disease he had diabetes. He has 2 sisters one with a history of melanoma. No significant family history of malignancy or clotting disorders.  ALLERGIES:  has No Known Allergies.  MEDICATIONS:  Current Outpatient Prescriptions  Medication Sig Dispense  Refill  . amLODipine (NORVASC) 5 MG tablet Take 5 mg by mouth daily.    Marland Kitchen atorvastatin (LIPITOR) 20 MG tablet Take 20 mg by mouth daily.    Marland Kitchen b complex vitamins tablet Take 1 tablet by mouth daily.    . Cyanocobalamin (VITAMIN B-12 PO) Take 1 tablet by mouth daily.    Marland Kitchen docusate sodium (COLACE) 100 MG capsule Take 100 mg by mouth 2 (two) times daily.    . folic acid (FOLVITE) 1 MG tablet TAKE 1 TABLET BY MOUTH EVERY DAY 30 tablet 6  . losartan (COZAAR) 100 MG tablet Take 100 mg by mouth daily.    . metoprolol succinate (TOPROL-XL) 25 MG 24 hr tablet Take 25 mg by mouth daily.  3  . Mometasone Furo-Formoterol Fum 200-5 MCG/ACT AERO Inhale 1 puff into the lungs  daily.     . mometasone-formoterol (DULERA) 200-5 MCG/ACT AERO INHALE 2 PUFFS INTO THE LUNGS TWICE DAILY    . psyllium (METAMUCIL) 58.6 % powder Take 1 packet by mouth 3 (three) times daily.    . Pyridoxine HCl (VITAMIN B-6 PO) Take 1 tablet by mouth.    Carlena Hurl 20 MG TABS tablet Take 20 mg by mouth daily with supper.   4   No current facility-administered medications for this visit.   Review of Systems  Constitutional: Negative for fever, chills, weight loss and malaise/fatigue.  HENT: Negative for congestion, hearing loss, nosebleeds, sore throat and tinnitus.   Eyes: Negative for blurred vision, double vision, pain and discharge.  Respiratory: Negative for cough, hemoptysis, sputum production, shortness of breath and wheezing.   Cardiovascular: Negative for chest pain, palpitations, claudication, leg swelling and PND.  Gastrointestinal: Negative for heartburn, nausea, vomiting, abdominal pain, diarrhea, constipation, blood in stool and melena.  Genitourinary: Negative for dysuria, urgency, frequency and hematuria.  Musculoskeletal: Negative for myalgias, joint pain and falls.  Skin: Negative for itching and rash.  Neurological: Negative for dizziness, tingling, tremors, sensory change, speech change, focal weakness, seizures, loss of consciousness, weakness and headaches.  Endo/Heme/Allergies: Does not bruise/bleed easily.  Psychiatric/Behavioral: Negative for depression, suicidal ideas, memory loss and substance abuse. The patient is not nervous/anxious and does not have insomnia.    14 point review of systems was performed and is negative except as detailed under history of present illness and above   PHYSICAL EXAMINATION: ECOG PERFORMANCE STATUS: 0 - Asymptomatic  Filed Vitals:   10/22/15 1107  BP: 134/88  Pulse: 68  Temp: 97.6 F (36.4 C)  Resp: 20   Filed Weights   10/22/15 1107  Weight: 222 lb 1.6 oz (100.744 kg)   Physical Exam  Constitutional: He is oriented to  person, place, and time and well-developed, well-nourished, and in no distress. Wears glasses. HENT:  Head: Normocephalic and atraumatic.  Nose: Nose normal.  Mouth/Throat: Oropharynx is clear and moist. No oropharyngeal exudate.  Eyes: Conjunctivae and EOM are normal. Pupils are equal, round, and reactive to light. Right eye exhibits no discharge. Left eye exhibits no discharge. No scleral icterus.  Neck: Normal range of motion. Neck supple. No tracheal deviation present. No thyromegaly present.  Cardiovascular: Normal rate, regular rhythm and normal heart sounds.  Exam reveals no gallop and no friction rub.   No murmur heard. Pulmonary/Chest: Effort normal and breath sounds normal. He has no wheezes. He has no rales.  Abdominal: Soft. Bowel sounds are normal. He exhibits no distension and no mass. There is no tenderness. There is no rebound and no guarding.  Musculoskeletal: Normal range of motion. He exhibits no edema.   Lymphadenopathy:    He has no cervical adenopathy.  Neurological: He is alert and oriented to person, place, and time. He has normal reflexes. No cranial nerve deficit. Gait normal. Coordination normal.  Skin: Skin is warm and dry. No rash noted.  Psychiatric: Mood, memory, affect and judgment normal.  Nursing note and vitals reviewed.   LABORATORY DATA:  I have reviewed the data as listed Lab Results  Component Value Date   WBC 10.1 11/13/2014   HGB 16.3 11/13/2014   HCT 47.5 11/13/2014   MCV 96.9 11/13/2014   PLT 239 11/13/2014     Chemistry      Component Value Date/Time   NA 136 07/16/2014 1630   K 3.9 07/16/2014 1630   CL 105 07/16/2014 1630   CO2 22 07/16/2014 1630   BUN 23 07/16/2014 1630   CREATININE 1.15 07/16/2014 1630      Component Value Date/Time   CALCIUM 9.3 07/16/2014 1630   ALKPHOS 69 07/16/2014 1630   AST 19 07/16/2014 1630   ALT 16 07/16/2014 1630   BILITOT 1.0 07/16/2014 1630     Note patient was on  XARELTO:         ASSESSMENT & PLAN:   Bilateral pulmonary emboli in the postoperative setting, while therapeutic on Coumadin Factor V leiden heterozygosity  65 year old male who underwent bilateral total knee replacements. He was discharged on Coumadin for prophylaxis, INR was therapeutic. He developed shortness of breath over the following week, documented INR levels were therapeutic. He was diagnosed with bilateral pulmonary emboli. [facility] completed [facility].   He has been diagnosed with factor V Leiden heterozygosity. Since his knee replacement he has become very ambulatory and active. I suspect that the abnormalities with his lupus anticoagulant were secondary Xarelto therapy. There is evidence of this in the literature. This was repeated last July and WNL. We will repeat today.  He has discontinued Xarelto without issue. He is fully aware of his diagnosis and what to look out for. He has communicated this with his daughters, as well.  The patient will undergo repeat laboratory studies. If these return normal, the patient will be discharged.   All questions were answered. The patient knows to call the clinic with any problems, questions or concerns.   This document serves as a record of services personally performed by Loma MessingShannon Penland, MD. It was created on her behalf by Delana MeyerElizabeth Ashley, a trained medical scribe. The creation of this record is based on the scribe's personal observations and the provider's statements to them. This document has been checked and approved by the attending provider.  I have reviewed the above documentation for accuracy and completeness, and I agree with the above.  Novella OliveShannon K. Penland MD

## 2015-10-23 ENCOUNTER — Encounter (HOSPITAL_COMMUNITY): Payer: Self-pay | Admitting: Hematology & Oncology

## 2015-10-23 LAB — HOMOCYSTEINE: HOMOCYSTEINE-NORM: 8.4 umol/L (ref 0.0–15.0)

## 2015-10-28 LAB — LUPUS ANTICOAGULANT PANEL
DRVVT: 50.8 s — AB (ref 0.0–47.0)
PTT LA: 39 s (ref 0.0–43.6)

## 2015-10-28 LAB — DRVVT MIX: dRVVT Mix: 43.9 s (ref 0.0–47.0)

## 2015-10-29 DIAGNOSIS — M7021 Olecranon bursitis, right elbow: Secondary | ICD-10-CM | POA: Diagnosis not present

## 2015-11-10 DIAGNOSIS — M7021 Olecranon bursitis, right elbow: Secondary | ICD-10-CM | POA: Diagnosis not present

## 2015-11-10 DIAGNOSIS — L03113 Cellulitis of right upper limb: Secondary | ICD-10-CM | POA: Diagnosis not present

## 2015-11-20 DIAGNOSIS — M25521 Pain in right elbow: Secondary | ICD-10-CM | POA: Diagnosis not present

## 2015-11-26 DIAGNOSIS — M25521 Pain in right elbow: Secondary | ICD-10-CM | POA: Diagnosis not present

## 2016-02-11 DIAGNOSIS — E1169 Type 2 diabetes mellitus with other specified complication: Secondary | ICD-10-CM | POA: Diagnosis not present

## 2016-02-11 DIAGNOSIS — E785 Hyperlipidemia, unspecified: Secondary | ICD-10-CM | POA: Diagnosis not present

## 2016-02-11 DIAGNOSIS — N529 Male erectile dysfunction, unspecified: Secondary | ICD-10-CM | POA: Diagnosis not present

## 2016-02-11 DIAGNOSIS — I10 Essential (primary) hypertension: Secondary | ICD-10-CM | POA: Diagnosis not present

## 2016-05-14 ENCOUNTER — Ambulatory Visit (HOSPITAL_COMMUNITY)
Admission: EM | Admit: 2016-05-14 | Discharge: 2016-05-14 | Disposition: A | Discharge status: Home / Self Care | Payer: PPO | Attending: Family Medicine | Admitting: Family Medicine

## 2016-05-14 ENCOUNTER — Encounter (HOSPITAL_COMMUNITY): Payer: Self-pay | Admitting: *Deleted

## 2016-05-14 ENCOUNTER — Ambulatory Visit (INDEPENDENT_AMBULATORY_CARE_PROVIDER_SITE_OTHER): Payer: PPO

## 2016-05-14 DIAGNOSIS — J209 Acute bronchitis, unspecified: Secondary | ICD-10-CM | POA: Diagnosis not present

## 2016-05-14 DIAGNOSIS — R05 Cough: Secondary | ICD-10-CM | POA: Diagnosis not present

## 2016-05-14 NOTE — ED Provider Notes (Signed)
[facility]    CSN: [id number] Arrival date & time: 05/14/16  1423     History   Chief Complaint [id number] Complaint  Patient presents with  . Cough    HPI Devin Castro is a 65 y.o. male.   The history is provided by the patient.  Cough  Cough characteristics:  Non-productive Severity:  Mild Onset quality:  Gradual Duration:  2 weeks Progression:  Unchanged Chronicity:  New Smoker: no   Context: upper respiratory infection and weather changes   Relieved by:  None tried Associated symptoms: rhinorrhea   Associated symptoms: no fever and no shortness of breath     Past Medical History:  Diagnosis Date  . Arthritis   . COPD (chronic obstructive pulmonary disease) (HCC)   . Hypertension   . Kidney stone   . PE (pulmonary embolism)   . Shortness of breath dyspnea   . Vertigo     Patient Active Problem List   Diagnosis Date Noted  . PE (pulmonary embolism)   . Pulmonary embolism (HCC) 06/15/2014  . Hypertension 06/15/2014  . COPD (chronic obstructive pulmonary disease) (HCC) 06/15/2014  . Arthritis of knee 06/04/2014    Past Surgical History:  Procedure Laterality Date  . CARDIAC CATHETERIZATION    . COLONOSCOPY    . kidney stone removal    . TOTAL KNEE ARTHROPLASTY Bilateral 06/04/2014   Procedure: TOTAL KNEE BILATERAL;  Surgeon: Cammy CopaGregory Scott Dean, MD;  [facility];  Service: Orthopedics;  Laterality: Bilateral;       Home Medications    Prior to Admission medications   Medication Sig Start Date End Date Taking? Authorizing Provider  amLODipine (NORVASC) 5 MG tablet Take 5 mg by mouth daily.    Historical Provider, MD  atorvastatin (LIPITOR) 20 MG tablet Take 20 mg by mouth daily.    Historical Provider, MD  b complex vitamins tablet Take 1 tablet by mouth daily.    Historical Provider, MD  Cyanocobalamin (VITAMIN B-12 PO) Take 1 tablet by mouth daily.    Historical Provider, MD  docusate sodium (COLACE) 100 MG capsule Take 100 mg by  mouth 2 (two) times daily.    Historical Provider, MD  folic acid (FOLVITE) 1 MG tablet TAKE 1 TABLET BY MOUTH EVERY DAY 03/12/15   Ellouise Newerhomas S Kefalas, PA-C  losartan (COZAAR) 100 MG tablet Take 100 mg by mouth daily.    Historical Provider, MD  metoprolol succinate (TOPROL-XL) 25 MG 24 hr tablet Take 25 mg by mouth daily. 05/23/14   Historical Provider, MD  Mometasone Furo-Formoterol Fum 200-5 MCG/ACT AERO Inhale 1 puff into the lungs daily.     Historical Provider, MD  mometasone-formoterol (DULERA) 200-5 MCG/ACT AERO INHALE 2 PUFFS INTO THE LUNGS TWICE DAILY 07/25/14   Historical Provider, MD  psyllium (METAMUCIL) 58.6 % powder Take 1 packet by mouth 3 (three) times daily.    Historical Provider, MD  Pyridoxine HCl (VITAMIN B-6 PO) Take 1 tablet by mouth.    Historical Provider, MD  XARELTO 20 MG TABS tablet Take 20 mg by mouth daily with supper.  08/12/14   Historical Provider, MD    Family History Family History  Problem Relation Age of Onset  . Cancer Mother   . Diabetes Father   . Cancer Sister     Social History Social History  Substance Use Topics  . Smoking status: Former Smoker    Years: 20.00    Quit date: 08/16/2002  . Smokeless tobacco: Former NeurosurgeonUser  .  Alcohol use No     Allergies   Patient has no known allergies.   Review of Systems Review of Systems  Constitutional: Negative.  Negative for fever.  HENT: Positive for congestion, postnasal drip and rhinorrhea.   Respiratory: Positive for cough. Negative for shortness of breath.   Cardiovascular: Negative.   All other systems reviewed and are negative.    Physical Exam Triage Vital Signs ED Triage Vitals [05/14/16 1445]  Enc Vitals Group     BP 130/70     Pulse Rate 78     Resp 18     Temp 98.6 F (37 C)     Temp Source Oral     SpO2 96 %     Weight      Height      Head Circumference      Peak Flow      Pain Score      Pain Loc      Pain Edu?      Excl. in GC?    No data found.   Updated Vital  Signs BP 130/70 (BP Location: Left Arm)   Pulse 78   Temp 98.6 F (37 C) (Oral)   Resp 18   SpO2 96%   Visual Acuity Right Eye Distance:   Left Eye Distance:   Bilateral Distance:    Right Eye Near:   Left Eye Near:    Bilateral Near:     Physical Exam  Constitutional: He is oriented to person, place, and time. He appears well-developed and well-nourished.  HENT:  Right Ear: External ear normal.  Left Ear: External ear normal.  Nose: Nose normal.  Mouth/Throat: Oropharynx is clear and moist.  Eyes: Pupils are equal, round, and reactive to light.  Neck: Normal range of motion. Neck supple.  Cardiovascular: Normal rate.   Pulmonary/Chest: He has wheezes.  Lymphadenopathy:    He has no cervical adenopathy.  Neurological: He is alert and oriented to person, place, and time.  Skin: Skin is warm and dry.  Nursing note and vitals reviewed.    UC Treatments / Results  Labs (all labs ordered are listed, but only abnormal results are displayed) Labs Reviewed - No data to display  EKG  EKG Interpretation None       Radiology Dg Chest 2 View  Result Date: 05/14/2016 CLINICAL DATA:  Patient states that he's had cough/sinus congestion x 10 days, blood in sputum. Hx of COPD, HTN, pulmonary embolism. Former smoker- quit in 2004. EXAM: CHEST  2 VIEW COMPARISON:  06/15/2014 FINDINGS: Cardiomediastinal silhouette is normal. There is mild perihilar peribronchial thickening. No focal consolidations or pleural effusions identified. No pulmonary edema. IMPRESSION: 1. Mild bronchitic changes. 2.  No focal acute pulmonary abnormality. Electronically Signed   By: Norva PavlovElizabeth  Brown M.D.   On: 05/14/2016 14:57  X-rays reviewed and report per radiologist.   Procedures Procedures (including critical care time)  Medications Ordered in UC Medications - No data to display   Initial Impression / Assessment and Plan / UC Course  I have reviewed the triage vital signs and the nursing  notes.  Pertinent labs & imaging results that were available during my care of the patient were reviewed by me and considered in my medical decision making (see chart for details).  Clinical Course       Final Clinical Impressions(s) / UC Diagnoses   Final diagnoses:  None    New Prescriptions New Prescriptions   No medications on file  Linna Hoff, MD 06/01/16 2039

## 2016-05-14 NOTE — ED Triage Notes (Signed)
Pt  Has     A  Non  Productive  Cough  X    sev   Weeks  Pt has a  Blood  Disorder    And  Noticed   Some  Blood  In his sputum  Today  He  Is   Alert  And  Oriented and  In no  Severe   Distress

## 2016-05-14 NOTE — Discharge Instructions (Signed)
Use mucinex and drink plenty of fluids, see your doctor Iffurther problems

## 2016-05-19 ENCOUNTER — Encounter (HOSPITAL_COMMUNITY): Payer: Self-pay | Admitting: *Deleted

## 2016-05-19 ENCOUNTER — Emergency Department (HOSPITAL_COMMUNITY)
Admission: EM | Admit: 2016-05-19 | Discharge: 2016-05-19 | Disposition: A | Discharge status: Home / Self Care | Payer: PPO | Attending: Emergency Medicine | Admitting: Emergency Medicine

## 2016-05-19 ENCOUNTER — Ambulatory Visit (HOSPITAL_COMMUNITY): Admission: EM | Admit: 2016-05-19 | Discharge: 2016-05-19 | Discharge status: Absent without leave | Payer: PPO

## 2016-05-19 DIAGNOSIS — J449 Chronic obstructive pulmonary disease, unspecified: Secondary | ICD-10-CM | POA: Insufficient documentation

## 2016-05-19 DIAGNOSIS — T18128A Food in esophagus causing other injury, initial encounter: Secondary | ICD-10-CM | POA: Diagnosis not present

## 2016-05-19 DIAGNOSIS — Y999 Unspecified external cause status: Secondary | ICD-10-CM | POA: Insufficient documentation

## 2016-05-19 DIAGNOSIS — Z7901 Long term (current) use of anticoagulants: Secondary | ICD-10-CM | POA: Insufficient documentation

## 2016-05-19 DIAGNOSIS — Z96653 Presence of artificial knee joint, bilateral: Secondary | ICD-10-CM | POA: Diagnosis not present

## 2016-05-19 DIAGNOSIS — Z79899 Other long term (current) drug therapy: Secondary | ICD-10-CM | POA: Insufficient documentation

## 2016-05-19 DIAGNOSIS — K222 Esophageal obstruction: Secondary | ICD-10-CM | POA: Diagnosis not present

## 2016-05-19 DIAGNOSIS — Z87891 Personal history of nicotine dependence: Secondary | ICD-10-CM | POA: Insufficient documentation

## 2016-05-19 DIAGNOSIS — Y9389 Activity, other specified: Secondary | ICD-10-CM | POA: Insufficient documentation

## 2016-05-19 DIAGNOSIS — I10 Essential (primary) hypertension: Secondary | ICD-10-CM | POA: Diagnosis not present

## 2016-05-19 DIAGNOSIS — X58XXXA Exposure to other specified factors, initial encounter: Secondary | ICD-10-CM | POA: Diagnosis not present

## 2016-05-19 DIAGNOSIS — T18108A Unspecified foreign body in esophagus causing other injury, initial encounter: Secondary | ICD-10-CM

## 2016-05-19 DIAGNOSIS — T189XXA Foreign body of alimentary tract, part unspecified, initial encounter: Secondary | ICD-10-CM | POA: Diagnosis not present

## 2016-05-19 DIAGNOSIS — Y929 Unspecified place or not applicable: Secondary | ICD-10-CM | POA: Insufficient documentation

## 2016-05-19 LAB — BASIC METABOLIC PANEL
Anion gap: 13 (ref 5–15)
BUN: 14 mg/dL (ref 6–20)
CALCIUM: 10 mg/dL (ref 8.9–10.3)
CHLORIDE: 109 mmol/L (ref 101–111)
CO2: 21 mmol/L — ABNORMAL LOW (ref 22–32)
CREATININE: 0.93 mg/dL (ref 0.61–1.24)
GFR calc Af Amer: >60 mL/min (ref >60)
GFR calc non Af Amer: >60 mL/min (ref >60)
Glucose, Bld: 96 mg/dL (ref 65–99)
Potassium: 4.5 mmol/L (ref 3.5–5.1)
SODIUM: 143 mmol/L (ref 135–145)

## 2016-05-19 LAB — CBC WITH DIFFERENTIAL/PLATELET
BASOS PCT: 0 %
Basophils Absolute: 0 10*3/uL (ref 0.0–0.1)
EOS ABS: 0.3 10*3/uL (ref 0.0–0.7)
EOS PCT: 2 %
HCT: 51.7 % (ref 39.0–52.0)
Hemoglobin: 17.9 g/dL — ABNORMAL HIGH (ref 13.0–17.0)
Lymphocytes Relative: 22 %
Lymphs Abs: 2.8 10*3/uL (ref 0.7–4.0)
MCH: 33.9 pg (ref 26.0–34.0)
MCHC: 34.6 g/dL (ref 30.0–36.0)
MCV: 97.9 fL (ref 78.0–100.0)
Monocytes Absolute: 0.9 10*3/uL (ref 0.1–1.0)
Monocytes Relative: 7 %
Neutro Abs: 8.6 10*3/uL — ABNORMAL HIGH (ref 1.7–7.7)
Neutrophils Relative %: 69 %
PLATELETS: 263 10*3/uL (ref 150–400)
RBC: 5.28 MIL/uL (ref 4.22–5.81)
RDW: 13.7 % (ref 11.5–15.5)
WBC: 12.5 10*3/uL — AB (ref 4.0–10.5)

## 2016-05-19 MED ORDER — GLUCAGON HCL RDNA (DIAGNOSTIC) 1 MG IJ SOLR
1.0000 mg | INTRAMUSCULAR | Status: DC | PRN
  Administered 2016-05-19: 1 mg via INTRAVENOUS
  Filled 2016-05-19: qty 1

## 2016-05-19 MED ORDER — STERILE WATER FOR INJECTION IJ SOLN
INTRAMUSCULAR | Status: AC
  Filled 2016-05-19: qty 10

## 2016-05-19 MED ORDER — SODIUM CHLORIDE 0.9 % IV BOLUS (SEPSIS)
500.0000 mL | Freq: Once | INTRAVENOUS | Status: AC
  Administered 2016-05-19: 500 mL via INTRAVENOUS

## 2016-05-19 NOTE — ED Triage Notes (Signed)
Pt reports swallowing food this am and now feels like something is lodged in throat. Airway is intact but pt is unable to swallow food or saliva.

## 2016-05-19 NOTE — ED Provider Notes (Signed)
[facility]   CSN: [id number] Arrival date & time: 05/19/16  1016     History   Chief Complaint Chief Complaint  Patient presents with  . Emesis  . Dysphagia    HPI Devin Castro is a 66 y.o. male.  He was eating an egg sandwich today, then suddenly felt like something got stuck and he couldn't swallow anymore. He has been unable to swallow a food or drink since. He is also spitting his saliva. He has had intermittent symptoms similar to this in the past but none recently. He denies recent fever, chills, cough, shortness of breath, weakness or dizziness. He does not have chronic heartburn symptoms. There are no other known modifying factors    HPI  Past Medical History:  Diagnosis Date  . Arthritis   . COPD (chronic obstructive pulmonary disease) (HCC)   . Hypertension   . Kidney stone   . PE (pulmonary embolism)   . Shortness of breath dyspnea   . Vertigo     Patient Active Problem List   Diagnosis Date Noted  . PE (pulmonary embolism)   . Pulmonary embolism (HCC) 06/15/2014  . Hypertension 06/15/2014  . COPD (chronic obstructive pulmonary disease) (HCC) 06/15/2014  . Arthritis of knee 06/04/2014    Past Surgical History:  Procedure Laterality Date  . CARDIAC CATHETERIZATION    . COLONOSCOPY    . kidney stone removal    . TOTAL KNEE ARTHROPLASTY Bilateral 06/04/2014   Procedure: TOTAL KNEE BILATERAL;  Surgeon: Cammy Copa, MD;  Location: [facility] OR;  Service: Orthopedics;  Laterality: Bilateral;       Home Medications    Prior to Admission medications   Medication Sig Start Date End Date Taking? Authorizing Provider  amLODipine (NORVASC) 5 MG tablet Take 5 mg by mouth daily.    Historical Provider, MD  atorvastatin (LIPITOR) 20 MG tablet Take 20 mg by mouth daily.    Historical Provider, MD  b complex vitamins tablet Take 1 tablet by mouth daily.    Historical Provider, MD  Cyanocobalamin (VITAMIN B-12 PO) Take 1 tablet by mouth  daily.    Historical Provider, MD  docusate sodium (COLACE) 100 MG capsule Take 100 mg by mouth 2 (two) times daily.    Historical Provider, MD  folic acid (FOLVITE) 1 MG tablet TAKE 1 TABLET BY MOUTH EVERY DAY 03/12/15   Ellouise Newer, PA-C  losartan (COZAAR) 100 MG tablet Take 100 mg by mouth daily.    Historical Provider, MD  metoprolol succinate (TOPROL-XL) 25 MG 24 hr tablet Take 25 mg by mouth daily. 05/23/14   Historical Provider, MD  Mometasone Furo-Formoterol Fum 200-5 MCG/ACT AERO Inhale 1 puff into the lungs daily.     Historical Provider, MD  mometasone-formoterol (DULERA) 200-5 MCG/ACT AERO INHALE 2 PUFFS INTO THE LUNGS TWICE DAILY 07/25/14   Historical Provider, MD  psyllium (METAMUCIL) 58.6 % powder Take 1 packet by mouth 3 (three) times daily.    Historical Provider, MD  Pyridoxine HCl (VITAMIN B-6 PO) Take 1 tablet by mouth.    Historical Provider, MD  XARELTO 20 MG TABS tablet Take 20 mg by mouth daily with supper.  08/12/14   Historical Provider, MD    Family History Family History  Problem Relation Age of Onset  . Cancer Mother   . Diabetes Father   . Cancer Sister     Social History Social History  Substance Use Topics  . Smoking status: Former Smoker  Years: 20.00    Quit date: 08/16/2002  . Smokeless tobacco: Former NeurosurgeonUser  . Alcohol use No     Allergies   Patient has no known allergies.   Review of Systems Review of Systems  All other systems reviewed and are negative.    Physical Exam Updated Vital Signs BP 146/91 (BP Location: Left Arm)   Pulse 91   Temp 97.8 F (36.6 C) (Oral)   Resp 16   Ht 5\' 10"  (1.778 m)   Wt 220 lb (99.8 kg)   SpO2 94%   BMI 31.57 kg/m   Physical Exam  Constitutional: He is oriented to person, place, and time. He appears well-developed and well-nourished.  HENT:  Head: Normocephalic and atraumatic.  Right Ear: External ear normal.  Left Ear: External ear normal.  Eyes: Conjunctivae and EOM are normal. Pupils are  equal, round, and reactive to light.  Neck: Normal range of motion and phonation normal. Neck supple.  Cardiovascular: Normal rate, regular rhythm and normal heart sounds.   Pulmonary/Chest: Effort normal and breath sounds normal. He exhibits no bony tenderness.  Abdominal: Soft. There is no tenderness.  Musculoskeletal: Normal range of motion.  Neurological: He is alert and oriented to person, place, and time. No cranial nerve deficit or sensory deficit. He exhibits normal muscle tone. Coordination normal.  Skin: Skin is warm, dry and intact.  Psychiatric: He has a normal mood and affect. His behavior is normal. Judgment and thought content normal.  Nursing note and vitals reviewed.    ED Treatments / Results  Labs (all labs ordered are listed, but only abnormal results are displayed) Labs Reviewed  BASIC METABOLIC PANEL - Abnormal; Notable for the following:       Result Value   CO2 21 (*)    All other components within normal limits  CBC WITH DIFFERENTIAL/PLATELET - Abnormal; Notable for the following:    WBC 12.5 (*)    Hemoglobin 17.9 (*)    Neutro Abs 8.6 (*)    All other components within normal limits    EKG  EKG Interpretation None       Radiology No results found.  Procedures Procedures (including critical care time)  Medications Ordered in ED Medications  glucagon (human recombinant) (GLUCAGEN) injection 1 mg (1 mg Intravenous Given 05/19/16 1252)  sodium chloride 0.9 % bolus 500 mL (0 mLs Intravenous Stopped 05/19/16 1432)     Initial Impression / Assessment and Plan / ED Course  I have reviewed the triage vital signs and the nursing notes.  Pertinent labs & imaging results that were available during my care of the patient were reviewed by me and considered in my medical decision making (see chart for details).  Clinical Course     Medications  glucagon (human recombinant) (GLUCAGEN) injection 1 mg (1 mg Intravenous Given 05/19/16 1252)  sodium chloride  0.9 % bolus 500 mL (0 mLs Intravenous Stopped 05/19/16 1432)    Patient Vitals for the past 24 hrs:  BP Temp Temp src Pulse Resp SpO2 Height Weight  05/19/16 1428 146/91 - - 91 16 94 % - -  05/19/16 1400 137/86 - - 86 16 98 % - -  05/19/16 1300 (!) 149/121 - - 91 - 100 % - -  05/19/16 1230 138/94 - - 94 16 99 % - -  05/19/16 1214 148/96 - - 99 16 99 % - -  05/19/16 1031 (!) 138/102 97.8 F (36.6 C) Oral 109 18 97 % 5\' 10"  (  1.778 m) 220 lb (99.8 kg)    At D/C Reevaluation with update and discussion. After initial assessment and treatment, an updated evaluation reveals He was able to get up the offending food particle from his esophagus, and subsequently was able to eat and drink without problem. Findings discussed with the patient has wife, all questions were answered. Devin Castro    Final Clinical Impressions(s) / ED Diagnoses   Final diagnoses:  Foreign body in esophagus, initial encounter  Esophageal obstruction due to food impaction   Esophageal food obstruction, transient, relieved after treatment in the ED. History similar. He is stable for discharge, but will require outpatient evaluation and likely endoscopy.  Nursing Notes Reviewed/ Care Coordinated Applicable Imaging Reviewed Interpretation of Laboratory Data incorporated into ED treatment  The patient appears reasonably screened and/or stabilized for discharge and I doubt any other medical condition or other [facility] requiring further screening, evaluation, or treatment in the ED at this time prior to discharge.  Plan: Home Medications- continue; Home Treatments- Care with eating; return here if the recommended treatment, does not improve the symptoms; Recommended follow up- GI asap   New Prescriptions Discharge Medication List as of 05/19/2016  2:09 PM       Mancel Bale, MD 05/19/16 1710

## 2016-05-19 NOTE — Discharge Instructions (Signed)
You probably have a stricture in the esophagus which is causing your difficulty swallowing.  It is important to chew your food very well, and drink a lot of fluids, to prevent this from happening again.  We are referring you to a gastroenterologist for further evaluation and likely endoscopy to evaluate your esophagus.

## 2016-05-21 DIAGNOSIS — Z86010 Personal history of colonic polyps: Secondary | ICD-10-CM | POA: Diagnosis not present

## 2016-05-21 DIAGNOSIS — R1314 Dysphagia, pharyngoesophageal phase: Secondary | ICD-10-CM | POA: Diagnosis not present

## 2016-05-24 ENCOUNTER — Other Ambulatory Visit: Payer: Self-pay | Admitting: Gastroenterology

## 2016-05-24 DIAGNOSIS — R1310 Dysphagia, unspecified: Secondary | ICD-10-CM

## 2016-05-26 ENCOUNTER — Ambulatory Visit
Admission: RE | Admit: 2016-05-26 | Discharge: 2016-05-26 | Disposition: A | Discharge status: Home / Self Care | Payer: PPO | Source: Ambulatory Visit | Attending: Gastroenterology | Admitting: Gastroenterology

## 2016-05-26 DIAGNOSIS — K449 Diaphragmatic hernia without obstruction or gangrene: Secondary | ICD-10-CM | POA: Diagnosis not present

## 2016-05-26 DIAGNOSIS — R1310 Dysphagia, unspecified: Secondary | ICD-10-CM

## 2016-06-18 DIAGNOSIS — Z86010 Personal history of colonic polyps: Secondary | ICD-10-CM | POA: Diagnosis not present

## 2016-08-11 DIAGNOSIS — J449 Chronic obstructive pulmonary disease, unspecified: Secondary | ICD-10-CM | POA: Diagnosis not present

## 2016-08-11 DIAGNOSIS — E785 Hyperlipidemia, unspecified: Secondary | ICD-10-CM | POA: Diagnosis not present

## 2016-08-11 DIAGNOSIS — I10 Essential (primary) hypertension: Secondary | ICD-10-CM | POA: Diagnosis not present

## 2016-08-11 DIAGNOSIS — R7301 Impaired fasting glucose: Secondary | ICD-10-CM | POA: Diagnosis not present

## 2016-12-13 ENCOUNTER — Telehealth (INDEPENDENT_AMBULATORY_CARE_PROVIDER_SITE_OTHER): Payer: Self-pay | Admitting: Orthopedic Surgery

## 2016-12-13 NOTE — Telephone Encounter (Signed)
Please advise. Thanks.  

## 2016-12-13 NOTE — Telephone Encounter (Signed)
Pt states minor cut on ankle so used last two pills of amoxiclav 875 that he usually takes prior to dental work. Pt states did not go to ED. No stitches or medical care for minor cut. No pus no swelling or discoloration. Pt states concerned if he should get more antibiotic to prevent any infection that would travel to knee sx site. Call pt and advise.

## 2016-12-13 NOTE — Telephone Encounter (Signed)
IC s/w patient. He will try and send a picture on mychart. If not he will call and schedule appt to see Dr August Saucerean to have this checked. He will call if he gets worse.

## 2016-12-13 NOTE — Telephone Encounter (Signed)
Need him to send pic of cut

## 2017-03-25 DIAGNOSIS — Z136 Encounter for screening for cardiovascular disorders: Secondary | ICD-10-CM | POA: Diagnosis not present

## 2017-03-25 DIAGNOSIS — I10 Essential (primary) hypertension: Secondary | ICD-10-CM | POA: Diagnosis not present

## 2017-03-25 DIAGNOSIS — R7301 Impaired fasting glucose: Secondary | ICD-10-CM | POA: Diagnosis not present

## 2017-03-25 DIAGNOSIS — Z87891 Personal history of nicotine dependence: Secondary | ICD-10-CM | POA: Diagnosis not present

## 2017-03-25 DIAGNOSIS — E785 Hyperlipidemia, unspecified: Secondary | ICD-10-CM | POA: Diagnosis not present

## 2017-03-25 DIAGNOSIS — Z1211 Encounter for screening for malignant neoplasm of colon: Secondary | ICD-10-CM | POA: Diagnosis not present

## 2017-03-25 DIAGNOSIS — Z0000 Encounter for general adult medical examination without abnormal findings: Secondary | ICD-10-CM | POA: Diagnosis not present

## 2017-03-25 DIAGNOSIS — Z125 Encounter for screening for malignant neoplasm of prostate: Secondary | ICD-10-CM | POA: Diagnosis not present

## 2017-04-05 DIAGNOSIS — Z1211 Encounter for screening for malignant neoplasm of colon: Secondary | ICD-10-CM | POA: Diagnosis not present

## 2017-04-14 DIAGNOSIS — J40 Bronchitis, not specified as acute or chronic: Secondary | ICD-10-CM | POA: Diagnosis not present

## 2017-09-22 DIAGNOSIS — R7301 Impaired fasting glucose: Secondary | ICD-10-CM | POA: Diagnosis not present

## 2017-09-22 DIAGNOSIS — I10 Essential (primary) hypertension: Secondary | ICD-10-CM | POA: Diagnosis not present

## 2017-09-22 DIAGNOSIS — E1169 Type 2 diabetes mellitus with other specified complication: Secondary | ICD-10-CM | POA: Diagnosis not present

## 2017-09-22 DIAGNOSIS — E785 Hyperlipidemia, unspecified: Secondary | ICD-10-CM | POA: Diagnosis not present

## 2017-09-22 DIAGNOSIS — J449 Chronic obstructive pulmonary disease, unspecified: Secondary | ICD-10-CM | POA: Diagnosis not present

## 2017-10-11 DIAGNOSIS — H35033 Hypertensive retinopathy, bilateral: Secondary | ICD-10-CM | POA: Diagnosis not present

## 2017-10-11 DIAGNOSIS — H524 Presbyopia: Secondary | ICD-10-CM | POA: Diagnosis not present

## 2018-03-29 DIAGNOSIS — E1169 Type 2 diabetes mellitus with other specified complication: Secondary | ICD-10-CM | POA: Diagnosis not present

## 2018-03-29 DIAGNOSIS — R7301 Impaired fasting glucose: Secondary | ICD-10-CM | POA: Diagnosis not present

## 2018-03-29 DIAGNOSIS — E785 Hyperlipidemia, unspecified: Secondary | ICD-10-CM | POA: Diagnosis not present

## 2018-03-29 DIAGNOSIS — I2699 Other pulmonary embolism without acute cor pulmonale: Secondary | ICD-10-CM | POA: Diagnosis not present

## 2018-03-29 DIAGNOSIS — Z136 Encounter for screening for cardiovascular disorders: Secondary | ICD-10-CM | POA: Diagnosis not present

## 2018-03-29 DIAGNOSIS — I10 Essential (primary) hypertension: Secondary | ICD-10-CM | POA: Diagnosis not present

## 2018-11-29 DIAGNOSIS — M9903 Segmental and somatic dysfunction of lumbar region: Secondary | ICD-10-CM | POA: Diagnosis not present

## 2018-11-29 DIAGNOSIS — M5136 Other intervertebral disc degeneration, lumbar region: Secondary | ICD-10-CM | POA: Diagnosis not present

## 2018-11-29 DIAGNOSIS — M9904 Segmental and somatic dysfunction of sacral region: Secondary | ICD-10-CM | POA: Diagnosis not present

## 2018-11-29 DIAGNOSIS — M9905 Segmental and somatic dysfunction of pelvic region: Secondary | ICD-10-CM | POA: Diagnosis not present

## 2018-12-04 DIAGNOSIS — M5136 Other intervertebral disc degeneration, lumbar region: Secondary | ICD-10-CM | POA: Diagnosis not present

## 2018-12-04 DIAGNOSIS — M9904 Segmental and somatic dysfunction of sacral region: Secondary | ICD-10-CM | POA: Diagnosis not present

## 2018-12-04 DIAGNOSIS — M9905 Segmental and somatic dysfunction of pelvic region: Secondary | ICD-10-CM | POA: Diagnosis not present

## 2018-12-04 DIAGNOSIS — M9903 Segmental and somatic dysfunction of lumbar region: Secondary | ICD-10-CM | POA: Diagnosis not present

## 2018-12-06 DIAGNOSIS — M9903 Segmental and somatic dysfunction of lumbar region: Secondary | ICD-10-CM | POA: Diagnosis not present

## 2018-12-06 DIAGNOSIS — M9904 Segmental and somatic dysfunction of sacral region: Secondary | ICD-10-CM | POA: Diagnosis not present

## 2018-12-06 DIAGNOSIS — M9905 Segmental and somatic dysfunction of pelvic region: Secondary | ICD-10-CM | POA: Diagnosis not present

## 2018-12-06 DIAGNOSIS — M5136 Other intervertebral disc degeneration, lumbar region: Secondary | ICD-10-CM | POA: Diagnosis not present

## 2018-12-13 DIAGNOSIS — M5136 Other intervertebral disc degeneration, lumbar region: Secondary | ICD-10-CM | POA: Diagnosis not present

## 2018-12-13 DIAGNOSIS — M9905 Segmental and somatic dysfunction of pelvic region: Secondary | ICD-10-CM | POA: Diagnosis not present

## 2018-12-13 DIAGNOSIS — M9903 Segmental and somatic dysfunction of lumbar region: Secondary | ICD-10-CM | POA: Diagnosis not present

## 2018-12-13 DIAGNOSIS — M9904 Segmental and somatic dysfunction of sacral region: Secondary | ICD-10-CM | POA: Diagnosis not present

## 2019-05-14 ENCOUNTER — Other Ambulatory Visit: Payer: Self-pay

## 2019-05-14 ENCOUNTER — Encounter (HOSPITAL_COMMUNITY): Payer: Self-pay

## 2019-05-14 ENCOUNTER — Emergency Department (HOSPITAL_COMMUNITY): Payer: PPO

## 2019-05-14 ENCOUNTER — Emergency Department (HOSPITAL_COMMUNITY)
Admission: EM | Admit: 2019-05-14 | Discharge: 2019-05-14 | Disposition: A | Discharge status: Home / Self Care | Payer: PPO | Source: Home / Self Care | Attending: Emergency Medicine | Admitting: Emergency Medicine

## 2019-05-14 DIAGNOSIS — N200 Calculus of kidney: Secondary | ICD-10-CM | POA: Diagnosis not present

## 2019-05-14 DIAGNOSIS — Z87891 Personal history of nicotine dependence: Secondary | ICD-10-CM | POA: Insufficient documentation

## 2019-05-14 DIAGNOSIS — J449 Chronic obstructive pulmonary disease, unspecified: Secondary | ICD-10-CM | POA: Insufficient documentation

## 2019-05-14 DIAGNOSIS — R1013 Epigastric pain: Secondary | ICD-10-CM | POA: Insufficient documentation

## 2019-05-14 DIAGNOSIS — U071 COVID-19: Secondary | ICD-10-CM | POA: Insufficient documentation

## 2019-05-14 DIAGNOSIS — J1282 Pneumonia due to coronavirus disease 2019: Secondary | ICD-10-CM | POA: Diagnosis not present

## 2019-05-14 DIAGNOSIS — I10 Essential (primary) hypertension: Secondary | ICD-10-CM | POA: Insufficient documentation

## 2019-05-14 DIAGNOSIS — R5383 Other fatigue: Secondary | ICD-10-CM | POA: Diagnosis not present

## 2019-05-14 DIAGNOSIS — R0600 Dyspnea, unspecified: Secondary | ICD-10-CM | POA: Diagnosis not present

## 2019-05-14 LAB — CBC WITH DIFFERENTIAL/PLATELET
Abs Immature Granulocytes: 0.07 10*3/uL (ref 0.00–0.07)
Basophils Absolute: 0 10*3/uL (ref 0.0–0.1)
Basophils Relative: 0 %
Eosinophils Absolute: 0 10*3/uL (ref 0.0–0.5)
Eosinophils Relative: 0 %
HCT: 49.8 % (ref 39.0–52.0)
Hemoglobin: 16.8 g/dL (ref 13.0–17.0)
Immature Granulocytes: 1 %
Lymphocytes Relative: 13 %
Lymphs Abs: 0.7 10*3/uL (ref 0.7–4.0)
MCH: 33.5 pg (ref 26.0–34.0)
MCHC: 33.7 g/dL (ref 30.0–36.0)
MCV: 99.4 fL (ref 80.0–100.0)
Monocytes Absolute: 0.7 10*3/uL (ref 0.1–1.0)
Monocytes Relative: 12 %
Neutro Abs: 4.3 10*3/uL (ref 1.7–7.7)
Neutrophils Relative %: 74 %
Platelets: 181 10*3/uL (ref 150–400)
RBC: 5.01 MIL/uL (ref 4.22–5.81)
RDW: 13.2 % (ref 11.5–15.5)
WBC: 5.9 10*3/uL (ref 4.0–10.5)
nRBC: 0 % (ref 0.0–0.2)

## 2019-05-14 LAB — URINALYSIS, ROUTINE W REFLEX MICROSCOPIC
Bilirubin Urine: NEGATIVE
Glucose, UA: NEGATIVE mg/dL
Ketones, ur: NEGATIVE mg/dL
Leukocytes,Ua: NEGATIVE
Nitrite: NEGATIVE
Protein, ur: NEGATIVE mg/dL
Specific Gravity, Urine: >1.046 — ABNORMAL HIGH (ref 1.005–1.030)
pH: 5 (ref 5.0–8.0)

## 2019-05-14 LAB — RESPIRATORY PANEL BY RT PCR (FLU A&B, COVID)
Influenza A by PCR: NEGATIVE
Influenza B by PCR: NEGATIVE
SARS Coronavirus 2 by RT PCR: POSITIVE — AB

## 2019-05-14 LAB — COMPREHENSIVE METABOLIC PANEL
ALT: 51 U/L — ABNORMAL HIGH (ref 0–44)
AST: 51 U/L — ABNORMAL HIGH (ref 15–41)
Albumin: 3.4 g/dL — ABNORMAL LOW (ref 3.5–5.0)
Alkaline Phosphatase: 53 U/L (ref 38–126)
Anion gap: 15 (ref 5–15)
BUN: 22 mg/dL (ref 8–23)
CO2: 24 mmol/L (ref 22–32)
Calcium: 8.6 mg/dL — ABNORMAL LOW (ref 8.9–10.3)
Chloride: 99 mmol/L (ref 98–111)
Creatinine, Ser: 0.99 mg/dL (ref 0.61–1.24)
GFR calc Af Amer: >60 mL/min (ref >60)
GFR calc non Af Amer: >60 mL/min (ref >60)
Glucose, Bld: 97 mg/dL (ref 70–99)
Potassium: 3.8 mmol/L (ref 3.5–5.1)
Sodium: 138 mmol/L (ref 135–145)
Total Bilirubin: 1.4 mg/dL — ABNORMAL HIGH (ref 0.3–1.2)
Total Protein: 7.3 g/dL (ref 6.5–8.1)

## 2019-05-14 LAB — POC SARS CORONAVIRUS 2 AG -  ED: SARS Coronavirus 2 Ag: NEGATIVE

## 2019-05-14 LAB — TROPONIN I (HIGH SENSITIVITY)
Troponin I (High Sensitivity): 8 ng/L (ref <18)
Troponin I (High Sensitivity): 8 ng/L (ref <18)

## 2019-05-14 LAB — LIPASE, BLOOD: Lipase: 41 U/L (ref 11–51)

## 2019-05-14 MED ORDER — SODIUM CHLORIDE 0.9 % IV BOLUS
1000.0000 mL | Freq: Once | INTRAVENOUS | Status: AC
  Administered 2019-05-14: 1000 mL via INTRAVENOUS

## 2019-05-14 MED ORDER — ONDANSETRON HCL 4 MG/2ML IJ SOLN
4.0000 mg | Freq: Once | INTRAMUSCULAR | Status: AC
  Administered 2019-05-14: 4 mg via INTRAVENOUS
  Filled 2019-05-14: qty 2

## 2019-05-14 MED ORDER — IOHEXOL 350 MG/ML SOLN
100.0000 mL | Freq: Once | INTRAVENOUS | Status: AC | PRN
  Administered 2019-05-14: 100 mL via INTRAVENOUS

## 2019-05-14 MED ORDER — PROCHLORPERAZINE EDISYLATE 10 MG/2ML IJ SOLN
10.0000 mg | Freq: Once | INTRAMUSCULAR | Status: AC
  Administered 2019-05-14: 14:00:00 10 mg via INTRAVENOUS
  Filled 2019-05-14: qty 2

## 2019-05-14 MED ORDER — ONDANSETRON 4 MG PO TBDP: 4.0000 mg | ORAL_TABLET | Freq: Three times a day (TID) | ORAL | 0 refills | Status: DC | PRN

## 2019-05-14 MED ORDER — KETOROLAC TROMETHAMINE 30 MG/ML IJ SOLN
15.0000 mg | Freq: Once | INTRAMUSCULAR | Status: AC
  Administered 2019-05-14: 15 mg via INTRAVENOUS
  Filled 2019-05-14: qty 1

## 2019-05-14 MED ORDER — KETOROLAC TROMETHAMINE 60 MG/2ML IM SOLN
15.0000 mg | Freq: Once | INTRAMUSCULAR | Status: DC
  Filled 2019-05-14: qty 2

## 2019-05-14 NOTE — ED Triage Notes (Addendum)
Pt reports abd pain, nausea, and "little" diarrhea x 3 days.  Reports  Some family members had covid approx 2 weeks ago but says he hasn't been around them.  Pt reports had a near syncopal episode after getting out of the shower yesterday.

## 2019-05-14 NOTE — ED Provider Notes (Signed)
[facility] EMERGENCY DEPARTMENT Provider Note   CSN: [id number] Arrival date & time: 05/14/19  4098     History Chief Complaint  Patient presents with  . Nausea    Devin Castro is a 68 y.o. male a hx of COPD (not on baseline O2, no longer smoking), hypertension, kidney stones, presented to the emergency department with nausea.  Patient reports he developed fatigue approximately 4 days ago on Christmas.  [place] Spaniel he suddenly felt tired all over.  He reports that that evening and the following day history of subsequently became very nauseous.  His symptoms predominantly nausea.  He has had no vomiting.  Small amount of diarrhea yesterday and is still passing gas.  He reports some cramping pain that began in his left upper quadrant yesterday, and is concerned that he may be passing another kidney stone.  He denies any dysuria or fevers.  He denies any history of MI or cardiac stents.  He reports his family members were tested positive for Covid but has not been around them in some time.  He denies SOB or coughing.    NKDA  HPI     Past Medical History:  Diagnosis Date  . Arthritis   . COPD (chronic obstructive pulmonary disease) (HCC)   . Hypertension   . Kidney stone   . PE (pulmonary embolism)   . Shortness of breath dyspnea   . Vertigo     Patient Active Problem List   Diagnosis Date Noted  . PE (pulmonary embolism)   . Pulmonary embolism (HCC) 06/15/2014  . Hypertension 06/15/2014  . COPD (chronic obstructive pulmonary disease) (HCC) 06/15/2014  . Arthritis of knee 06/04/2014    Past Surgical History:  Procedure Laterality Date  . CARDIAC CATHETERIZATION    . COLONOSCOPY    . kidney stone removal    . TOTAL KNEE ARTHROPLASTY Bilateral 06/04/2014   Procedure: TOTAL KNEE BILATERAL;  Surgeon: Cammy Copa, MD;  Location: [facility] OR;  Service: Orthopedics;  Laterality: Bilateral;       Family History  Problem Relation Age of Onset  . Cancer Mother   .  Diabetes Father   . Cancer Sister     Social History   Tobacco Use  . Smoking status: Former Smoker    Years: 20.00    Quit date: 08/16/2002    Years since quitting: 16.7  . Smokeless tobacco: Former Engineer, water Use Topics  . Alcohol use: No  . Drug use: No    Home Medications Prior to Admission medications   Medication Sig Start Date End Date Taking? Authorizing Provider  amLODipine (NORVASC) 10 MG tablet Take 10 mg by mouth daily. 05/07/19  Yes [provider]  losartan (COZAAR) 100 MG tablet Take 100 mg by mouth daily.   Yes [provider]  ondansetron (ZOFRAN ODT) 4 MG disintegrating tablet Take 1 tablet (4 mg total) by mouth every 8 (eight) hours as needed for up to 20 doses for nausea or vomiting. 05/14/19   Libbi Towner, Kermit Balo, MD    Allergies    Patient has no known allergies.  Review of Systems   Review of Systems  Constitutional: Positive for appetite change and fatigue. Negative for chills and fever.  Eyes: Negative for photophobia and visual disturbance.  Respiratory: Negative for cough and shortness of breath.   Cardiovascular: Negative for chest pain and palpitations.  Gastrointestinal: Positive for abdominal pain, diarrhea and nausea. Negative for vomiting.  Genitourinary: Negative for dysuria, flank pain  and hematuria.  Musculoskeletal: Negative for arthralgias and back pain.  Skin: Negative for pallor and rash.  Neurological: Positive for light-headedness. Negative for syncope and headaches.  All other systems reviewed and are negative.   Physical Exam Updated Vital Signs BP (!) 138/92 (BP Location: Left Arm)   Pulse 98   Temp 98.4 F (36.9 C) (Oral)   Resp (!) 23   Ht 5\' 10"  (1.778 m)   Wt 99.8 kg   SpO2 91%   BMI 31.57 kg/m   Physical Exam Vitals and nursing note reviewed.  Constitutional:      Appearance: He is well-developed.     Comments: Appears nauseated  HENT:     Head: Normocephalic and atraumatic.  Eyes:      Conjunctiva/sclera: Conjunctivae normal.     Pupils: Pupils are equal, round, and reactive to light.  Cardiovascular:     Rate and Rhythm: Normal rate and regular rhythm.     Pulses: Normal pulses.  Pulmonary:     Effort: Pulmonary effort is normal. No respiratory distress.     Breath sounds: Normal breath sounds.     Comments: 93% on room air, speaking comfortably Abdominal:     Palpations: Abdomen is soft.     Tenderness: There is no abdominal tenderness. There is no right CVA tenderness, left CVA tenderness, guarding or rebound. Negative signs include Murphy's sign.  Musculoskeletal:     Cervical back: Neck supple.  Skin:    General: Skin is warm and dry.  Neurological:     Mental Status: He is alert.  Psychiatric:        Mood and Affect: Mood normal.        Behavior: Behavior normal.     ED Results / Procedures / Treatments   Labs (all labs ordered are listed, but only abnormal results are displayed) Labs Reviewed  RESPIRATORY PANEL BY RT PCR (FLU A&B, COVID) - Abnormal; Notable for the following components:      Result Value   SARS Coronavirus 2 by RT PCR POSITIVE (*)    All other components within normal limits  COMPREHENSIVE METABOLIC PANEL - Abnormal; Notable for the following components:   Calcium 8.6 (*)    Albumin 3.4 (*)    AST 51 (*)    ALT 51 (*)    Total Bilirubin 1.4 (*)    All other components within normal limits  URINALYSIS, ROUTINE W REFLEX MICROSCOPIC - Abnormal; Notable for the following components:   Specific Gravity, Urine >1.046 (*)    Hgb urine dipstick LARGE (*)    Bacteria, UA FEW (*)    All other components within normal limits  CBC WITH DIFFERENTIAL/PLATELET  LIPASE, BLOOD  POC SARS CORONAVIRUS 2 AG -  ED  TROPONIN I (HIGH SENSITIVITY)  TROPONIN I (HIGH SENSITIVITY)    EKG EKG Interpretation  Date/Time:  Monday May 14 2019 08:42:53 EST Ventricular Rate:  83 PR Interval:    QRS Duration: 92 QT Interval:  365 QTC  Calculation: 429 R Axis:   18 Text Interpretation: Sinus rhythm No STEMI Confirmed by Octaviano Glow [phone]) on 05/14/2019 10:01:23 AM   Radiology CT Angio Chest PE W and/or Wo Contrast  Result Date: 05/14/2019 CLINICAL DATA:  Dyspnea. Near syncope. Epigastric abdominal pain, diarrhea and nausea. Remote history of bilateral pulmonary embolism. Not currently anticoagulated. EXAM: CT ANGIOGRAPHY CHEST CT ABDOMEN AND PELVIS WITH CONTRAST TECHNIQUE: Multidetector CT imaging of the chest was performed using the standard protocol during bolus administration of  intravenous contrast. Multiplanar CT image reconstructions and MIPs were obtained to evaluate the vascular anatomy. Multidetector CT imaging of the abdomen and pelvis was performed using the standard protocol during bolus administration of intravenous contrast. CONTRAST:  OMNIPAQUE IOHEXOL 350 MG/ML SOLN COMPARISON:  05/14/2016 chest radiograph. 07/15/2014 CT abdomen/pelvis. FINDINGS: CTA CHEST FINDINGS Cardiovascular: The study is high quality for the evaluation of pulmonary embolism. There are no filling defects in the central, lobar, segmental or subsegmental pulmonary artery branches to suggest acute pulmonary embolism. Atherosclerotic nonaneurysmal thoracic aorta. Normal caliber pulmonary arteries. Normal heart size. No significant pericardial fluid/thickening. Left anterior descending and right coronary atherosclerosis Mediastinum/Nodes: No discrete thyroid nodules. Unremarkable esophagus. No axillary adenopathy. Mildly enlarged 1.0 cm subcarinal node (series 4/image 51). Mildly enlarged 1.2 cm right hilar node (series 4/image 44). No additional pathologically enlarged mediastinal nodes. No pathologically enlarged left hilar nodes. Lungs/Pleura: No pneumothorax. No pleural effusion. There is extensive patchy ground-glass opacity throughout both lungs involving all lung lobes with a peripheral predominance, most prominent in the mid to upper  lungs bilaterally, with areas of crazy paving in the upper lobes (interlobular septal thickening plus ground-glass opacity). No lung masses or significant pulmonary nodules. Musculoskeletal: No aggressive appearing focal osseous lesions. Mild thoracic spondylosis. Review of the MIP images confirms the above findings. CT ABDOMEN and PELVIS FINDINGS Hepatobiliary: Normal liver size. Several scattered subcentimeter hypodense liver lesions, too small to characterize, not substantially changed, presumably benign. No definite new liver lesions. Cholelithiasis. No biliary ductal dilatation. Pancreas: Normal, with no mass or duct dilation. Spleen: Normal size. No mass. Adrenals/Urinary Tract: Normal adrenals. Nonobstructing 18 x 17 mm interpolar left renal stone. No hydronephrosis. Parapelvic renal cysts in both kidneys. Simple 2.3 cm renal cortical cyst in the lower left kidney. Numerous subcentimeter hypodense renal cortical lesions throughout both kidneys, too small to characterize, requiring no follow-up. Normal bladder. Stomach/Bowel: Small hiatal hernia. Otherwise normal nondistended stomach. Normal caliber small bowel with no small bowel wall thickening. Normal appendix. Marked diffuse colonic diverticulosis, most prominent in the sigmoid colon, with no definite large bowel wall thickening or acute pericolonic fat stranding. Vascular/Lymphatic: Atherosclerotic nonaneurysmal abdominal aorta. Patent portal, splenic, hepatic and renal veins. No pathologically enlarged lymph nodes in the abdomen or pelvis. Reproductive: Mild prostatomegaly. Other: No pneumoperitoneum, ascites or focal fluid collection. Musculoskeletal: No aggressive appearing focal osseous lesions. Marked lumbar spondylosis. Bilateral L5 pars defects with 10 mm anterolisthesis at L5-S1, unchanged. Review of the MIP images confirms the above findings. IMPRESSION: 1. No evidence of pulmonary embolism. 2. Extensive patchy ground-glass opacity with areas of  crazy paving throughout both lungs, nonspecific with a broad differential, but high suspicion for COVID-19 pneumonia. Follow-up chest radiographs recommended to document resolution. 3. Nonspecific mild subcarinal and right hilar lymphadenopathy. 4. Two vessel coronary atherosclerosis. 5. No acute abnormality in the abdomen or pelvis. Marked diffuse colonic diverticulosis, with no evidence of acute diverticulitis. No evidence of bowel obstruction or acute bowel inflammation. 6. Nonobstructing left nephrolithiasis.  No hydronephrosis. 7. Small hiatal hernia. 8. Mild prostatomegaly. 9. Chronic bilateral L5 pars defects. 10.  Aortic Atherosclerosis (ICD10-I70.0). Electronically Signed   By: Delbert Phenix M.D.   On: 05/14/2019 15:02   CT ABDOMEN PELVIS W CONTRAST  Result Date: 05/14/2019 CLINICAL DATA:  Dyspnea. Near syncope. Epigastric abdominal pain, diarrhea and nausea. Remote history of bilateral pulmonary embolism. Not currently anticoagulated. EXAM: CT ANGIOGRAPHY CHEST CT ABDOMEN AND PELVIS WITH CONTRAST TECHNIQUE: Multidetector CT imaging of the chest was performed using the standard protocol  during bolus administration of intravenous contrast. Multiplanar CT image reconstructions and MIPs were obtained to evaluate the vascular anatomy. Multidetector CT imaging of the abdomen and pelvis was performed using the standard protocol during bolus administration of intravenous contrast. CONTRAST:  OMNIPAQUE IOHEXOL 350 MG/ML SOLN COMPARISON:  05/14/2016 chest radiograph. 07/15/2014 CT abdomen/pelvis. FINDINGS: CTA CHEST FINDINGS Cardiovascular: The study is high quality for the evaluation of pulmonary embolism. There are no filling defects in the central, lobar, segmental or subsegmental pulmonary artery branches to suggest acute pulmonary embolism. Atherosclerotic nonaneurysmal thoracic aorta. Normal caliber pulmonary arteries. Normal heart size. No significant pericardial fluid/thickening. Left anterior  descending and right coronary atherosclerosis Mediastinum/Nodes: No discrete thyroid nodules. Unremarkable esophagus. No axillary adenopathy. Mildly enlarged 1.0 cm subcarinal node (series 4/image 51). Mildly enlarged 1.2 cm right hilar node (series 4/image 44). No additional pathologically enlarged mediastinal nodes. No pathologically enlarged left hilar nodes. Lungs/Pleura: No pneumothorax. No pleural effusion. There is extensive patchy ground-glass opacity throughout both lungs involving all lung lobes with a peripheral predominance, most prominent in the mid to upper lungs bilaterally, with areas of crazy paving in the upper lobes (interlobular septal thickening plus ground-glass opacity). No lung masses or significant pulmonary nodules. Musculoskeletal: No aggressive appearing focal osseous lesions. Mild thoracic spondylosis. Review of the MIP images confirms the above findings. CT ABDOMEN and PELVIS FINDINGS Hepatobiliary: Normal liver size. Several scattered subcentimeter hypodense liver lesions, too small to characterize, not substantially changed, presumably benign. No definite new liver lesions. Cholelithiasis. No biliary ductal dilatation. Pancreas: Normal, with no mass or duct dilation. Spleen: Normal size. No mass. Adrenals/Urinary Tract: Normal adrenals. Nonobstructing 18 x 17 mm interpolar left renal stone. No hydronephrosis. Parapelvic renal cysts in both kidneys. Simple 2.3 cm renal cortical cyst in the lower left kidney. Numerous subcentimeter hypodense renal cortical lesions throughout both kidneys, too small to characterize, requiring no follow-up. Normal bladder. Stomach/Bowel: Small hiatal hernia. Otherwise normal nondistended stomach. Normal caliber small bowel with no small bowel wall thickening. Normal appendix. Marked diffuse colonic diverticulosis, most prominent in the sigmoid colon, with no definite large bowel wall thickening or acute pericolonic fat stranding. Vascular/Lymphatic:  Atherosclerotic nonaneurysmal abdominal aorta. Patent portal, splenic, hepatic and renal veins. No pathologically enlarged lymph nodes in the abdomen or pelvis. Reproductive: Mild prostatomegaly. Other: No pneumoperitoneum, ascites or focal fluid collection. Musculoskeletal: No aggressive appearing focal osseous lesions. Marked lumbar spondylosis. Bilateral L5 pars defects with 10 mm anterolisthesis at L5-S1, unchanged. Review of the MIP images confirms the above findings. IMPRESSION: 1. No evidence of pulmonary embolism. 2. Extensive patchy ground-glass opacity with areas of crazy paving throughout both lungs, nonspecific with a broad differential, but high suspicion for COVID-19 pneumonia. Follow-up chest radiographs recommended to document resolution. 3. Nonspecific mild subcarinal and right hilar lymphadenopathy. 4. Two vessel coronary atherosclerosis. 5. No acute abnormality in the abdomen or pelvis. Marked diffuse colonic diverticulosis, with no evidence of acute diverticulitis. No evidence of bowel obstruction or acute bowel inflammation. 6. Nonobstructing left nephrolithiasis.  No hydronephrosis. 7. Small hiatal hernia. 8. Mild prostatomegaly. 9. Chronic bilateral L5 pars defects. 10.  Aortic Atherosclerosis (ICD10-I70.0). Electronically Signed   By: Delbert Phenix M.D.   On: 05/14/2019 15:02    Procedures Procedures (including critical care time)  Medications Ordered in ED Medications  ondansetron (ZOFRAN) injection 4 mg (4 mg Intravenous Given 05/14/19 0851)  sodium chloride 0.9 % bolus 1,000 mL (0 mLs Intravenous Stopped 05/14/19 1000)  ketorolac (TORADOL) 30 MG/ML injection 15 mg (15 mg  Intravenous Given 05/14/19 0858)  prochlorperazine (COMPAZINE) injection 10 mg (10 mg Intravenous Given 05/14/19 1351)  iohexol (OMNIPAQUE) 350 MG/ML injection 100 mL (100 mLs Intravenous Contrast Given 05/14/19 1400)    ED Course  I have reviewed the triage vital signs and the nursing notes.  Pertinent  labs & imaging results that were available during my care of the patient were reviewed by me and considered in my medical decision making (see chart for details).  68 year old male with a history of pulmonary embolism in 2015-2016, on xarelto, HTN, presenting to the emergency department with fatigue and nausea.  Patient reports onset of symptoms approximately 4 days ago on Christmas.  Began with generalized fatigue and weakness.  Subsequently he has felt nauseated for the past several days.  He has had very little appetite.  Feels some cramping pain in his left flank and wonders if he may be passing another kidney stone.  There is reports several family members were positive for Covid, but does not believe he is had any recent exposure to them.  However all the same I think we should check his rapid Covid test here.  He is satting 93% which may be close to his baseline given his COPD (his ED visit on 05/19/2016 showed pulse ox of 94%), and does not appear to be in any respiratory distress.  This lowers my immediate suspicion for pulmonary embolism.  His nausea would also be consistent with a pulmonary embolism.  He is currently being anticoagulated.  Also check his electrolytes to see if he is dehydrated.  Check a lipase and liver enzymes.  Check an EKG and a troponin to evaluate for atypical ACS.    Clinical Course as of May 13 1840  Mon May 14, 2019  1237 Patient having return of nausea after mild improvement from Zofran.  He still has labored breathing in the room and is satting 93%.  Complaining of epigastric pain.  I think we should broaden the work-up down obtain a CT PE study as well as CT abdomen pelvis.  His rapid Covid test was negative but I will send the PCR including flu.   [MT]  1507 SARS Coronavirus 2 by RT PCR(!): POSITIVE [MT]    Clinical Course User Index [MT] Blanche Gallien, Kermit BaloMatthew J, MD    Final Clinical Impression(s) / ED Diagnoses Final diagnoses:  COVID-19    Rx / DC Orders ED  Discharge Orders         Ordered    ondansetron (ZOFRAN ODT) 4 MG disintegrating tablet  Every 8 hours PRN     05/14/19 1529           Terald Sleeperrifan, Lydiann Bonifas J, MD 05/14/19 1842

## 2019-05-14 NOTE — Discharge Instructions (Signed)
Discharge summary:  You tested positive for COVID-19 today.  The CT scan of your lung shows that you do have a moderate amount of disease in your lungs from your Covid infection.  This virus can make you feel sick, short of breath, as well as nauseated.  I prescribed you medicine to help with your nausea.    It is very important you keep yourself hydrated at home with soups, water, broth, Gatorade, and any other drinks you can manage.  Try to ensure that you eat every day.  Keep up your strength.  We discussed keeping you in the hospital versus sending you home.  You told me you wished to go home and manage your symptoms there.    As I explained, the virus typically peaks with the symptoms around day 10 or 11 after symptom onset.  The biggest concern is developing respiratory failure.  Consider buying a pulse oximeter to keep at home (either order online or try to have family look in local [employer]/drug stores).  You can check your home oxygen level daily.  If your oxygen level drops below 80%, or if you begin to feel lightheaded, or you feel significantly more short of breath, or too fatigued to do your daily activities, please call 911 return to the ER immediately.    Many people need to be hospitalized for further monitoring and care for COVID-19.  It is possible you may need this in the nearby future.  Many people also manage to recover on their own.  Please check in with your family two or three times daily, and with your primary care doctor's office in 5 days (by phone if needed).

## 2019-05-14 NOTE — ED Notes (Signed)
CRITICAL VALUE ALERT  Critical Value:  Covid Positive  Date & Time Notied:  05/14/19 1523  Provider Notified: Dr. Sabra Heck & Dr. Langston Masker  Orders Received/Actions taken: EDP notified

## 2019-05-14 NOTE — ED Notes (Signed)
Patient transported to CT 

## 2019-05-16 ENCOUNTER — Emergency Department (HOSPITAL_COMMUNITY): Payer: PPO

## 2019-05-16 ENCOUNTER — Inpatient Hospital Stay (HOSPITAL_COMMUNITY)
Admission: EM | Admit: 2019-05-16 | Discharge: 2019-05-24 | DRG: 177 | Disposition: A | Discharge status: Home Health | Payer: PPO | Attending: Internal Medicine | Admitting: Internal Medicine

## 2019-05-16 ENCOUNTER — Other Ambulatory Visit: Payer: Self-pay

## 2019-05-16 ENCOUNTER — Encounter (HOSPITAL_COMMUNITY): Payer: Self-pay

## 2019-05-16 DIAGNOSIS — Z86711 Personal history of pulmonary embolism: Secondary | ICD-10-CM

## 2019-05-16 DIAGNOSIS — R42 Dizziness and giddiness: Secondary | ICD-10-CM | POA: Diagnosis not present

## 2019-05-16 DIAGNOSIS — J9601 Acute respiratory failure with hypoxia: Secondary | ICD-10-CM | POA: Diagnosis not present

## 2019-05-16 DIAGNOSIS — I2699 Other pulmonary embolism without acute cor pulmonale: Secondary | ICD-10-CM | POA: Diagnosis present

## 2019-05-16 DIAGNOSIS — R000 Tachycardia, unspecified: Secondary | ICD-10-CM | POA: Diagnosis not present

## 2019-05-16 DIAGNOSIS — J189 Pneumonia, unspecified organism: Secondary | ICD-10-CM

## 2019-05-16 DIAGNOSIS — J1289 Other viral pneumonia: Secondary | ICD-10-CM

## 2019-05-16 DIAGNOSIS — R0602 Shortness of breath: Secondary | ICD-10-CM | POA: Diagnosis not present

## 2019-05-16 DIAGNOSIS — J1282 Pneumonia due to coronavirus disease 2019: Secondary | ICD-10-CM | POA: Diagnosis not present

## 2019-05-16 DIAGNOSIS — E785 Hyperlipidemia, unspecified: Secondary | ICD-10-CM | POA: Diagnosis not present

## 2019-05-16 DIAGNOSIS — M1990 Unspecified osteoarthritis, unspecified site: Secondary | ICD-10-CM | POA: Diagnosis not present

## 2019-05-16 DIAGNOSIS — R791 Abnormal coagulation profile: Secondary | ICD-10-CM | POA: Diagnosis not present

## 2019-05-16 DIAGNOSIS — R739 Hyperglycemia, unspecified: Secondary | ICD-10-CM | POA: Diagnosis not present

## 2019-05-16 DIAGNOSIS — Z96653 Presence of artificial knee joint, bilateral: Secondary | ICD-10-CM | POA: Diagnosis present

## 2019-05-16 DIAGNOSIS — E669 Obesity, unspecified: Secondary | ICD-10-CM | POA: Diagnosis present

## 2019-05-16 DIAGNOSIS — Z833 Family history of diabetes mellitus: Secondary | ICD-10-CM | POA: Diagnosis not present

## 2019-05-16 DIAGNOSIS — Z809 Family history of malignant neoplasm, unspecified: Secondary | ICD-10-CM | POA: Diagnosis not present

## 2019-05-16 DIAGNOSIS — J441 Chronic obstructive pulmonary disease with (acute) exacerbation: Secondary | ICD-10-CM | POA: Diagnosis not present

## 2019-05-16 DIAGNOSIS — Z9981 Dependence on supplemental oxygen: Secondary | ICD-10-CM

## 2019-05-16 DIAGNOSIS — Z87442 Personal history of urinary calculi: Secondary | ICD-10-CM | POA: Diagnosis not present

## 2019-05-16 DIAGNOSIS — J440 Chronic obstructive pulmonary disease with acute lower respiratory infection: Secondary | ICD-10-CM | POA: Diagnosis present

## 2019-05-16 DIAGNOSIS — Z87891 Personal history of nicotine dependence: Secondary | ICD-10-CM | POA: Diagnosis not present

## 2019-05-16 DIAGNOSIS — I10 Essential (primary) hypertension: Secondary | ICD-10-CM | POA: Diagnosis present

## 2019-05-16 DIAGNOSIS — J449 Chronic obstructive pulmonary disease, unspecified: Secondary | ICD-10-CM | POA: Diagnosis present

## 2019-05-16 DIAGNOSIS — Z6831 Body mass index (BMI) 31.0-31.9, adult: Secondary | ICD-10-CM | POA: Diagnosis not present

## 2019-05-16 DIAGNOSIS — U071 COVID-19: Secondary | ICD-10-CM | POA: Diagnosis not present

## 2019-05-16 DIAGNOSIS — Z79899 Other long term (current) drug therapy: Secondary | ICD-10-CM

## 2019-05-16 DIAGNOSIS — R9431 Abnormal electrocardiogram [ECG] [EKG]: Secondary | ICD-10-CM | POA: Diagnosis not present

## 2019-05-16 DIAGNOSIS — R0902 Hypoxemia: Secondary | ICD-10-CM | POA: Diagnosis not present

## 2019-05-16 LAB — CBG MONITORING, ED: Glucose-Capillary: 128 mg/dL — ABNORMAL HIGH (ref 70–99)

## 2019-05-16 LAB — CBC
HCT: 48.7 % (ref 39.0–52.0)
Hemoglobin: 16.7 g/dL (ref 13.0–17.0)
MCH: 33.8 pg (ref 26.0–34.0)
MCHC: 34.3 g/dL (ref 30.0–36.0)
MCV: 98.6 fL (ref 80.0–100.0)
Platelets: 203 10*3/uL (ref 150–400)
RBC: 4.94 MIL/uL (ref 4.22–5.81)
RDW: 13.1 % (ref 11.5–15.5)
WBC: 6.5 10*3/uL (ref 4.0–10.5)
nRBC: 0 % (ref 0.0–0.2)

## 2019-05-16 LAB — BASIC METABOLIC PANEL
Anion gap: 12 (ref 5–15)
BUN: 19 mg/dL (ref 8–23)
CO2: 24 mmol/L (ref 22–32)
Calcium: 8.3 mg/dL — ABNORMAL LOW (ref 8.9–10.3)
Chloride: 101 mmol/L (ref 98–111)
Creatinine, Ser: 1.2 mg/dL (ref 0.61–1.24)
GFR calc Af Amer: >60 mL/min (ref >60)
GFR calc non Af Amer: >60 mL/min (ref >60)
Glucose, Bld: 104 mg/dL — ABNORMAL HIGH (ref 70–99)
Potassium: 3.6 mmol/L (ref 3.5–5.1)
Sodium: 137 mmol/L (ref 135–145)

## 2019-05-16 LAB — LACTIC ACID, PLASMA
Lactic Acid, Venous: 2 mmol/L (ref 0.5–1.9)
Lactic Acid, Venous: 1.2 mmol/L (ref 0.5–1.9)

## 2019-05-16 LAB — LACTATE DEHYDROGENASE: LDH: 395 U/L — ABNORMAL HIGH (ref 98–192)

## 2019-05-16 LAB — D-DIMER, QUANTITATIVE: D-Dimer, Quant: 2.2 ug/mL-FEU — ABNORMAL HIGH (ref 0.00–0.50)

## 2019-05-16 LAB — TRIGLYCERIDES: Triglycerides: 80 mg/dL (ref <150)

## 2019-05-16 LAB — C-REACTIVE PROTEIN: CRP: 18.8 mg/dL — ABNORMAL HIGH (ref <1.0)

## 2019-05-16 LAB — TROPONIN I (HIGH SENSITIVITY)
Troponin I (High Sensitivity): 11 ng/L (ref <18)
Troponin I (High Sensitivity): 13 ng/L (ref <18)

## 2019-05-16 LAB — FERRITIN: Ferritin: 846 ng/mL — ABNORMAL HIGH (ref 24–336)

## 2019-05-16 LAB — PROCALCITONIN: Procalcitonin: 0.31 ng/mL

## 2019-05-16 LAB — FIBRINOGEN: Fibrinogen: >800 mg/dL — ABNORMAL HIGH (ref 210–475)

## 2019-05-16 LAB — BRAIN NATRIURETIC PEPTIDE: B Natriuretic Peptide: 25.8 pg/mL (ref 0.0–100.0)

## 2019-05-16 MED ORDER — ONDANSETRON 4 MG PO TBDP: 4.0000 mg | ORAL_TABLET | Freq: Three times a day (TID) | ORAL | Status: DC | PRN

## 2019-05-16 MED ORDER — IPRATROPIUM-ALBUTEROL 20-100 MCG/ACT IN AERS
1.0000 | INHALATION_SPRAY | Freq: Four times a day (QID) | RESPIRATORY_TRACT | Status: DC
  Administered 2019-05-17 – 2019-05-23 (×25): 1 via RESPIRATORY_TRACT
  Filled 2019-05-16: qty 4

## 2019-05-16 MED ORDER — ONDANSETRON HCL 4 MG PO TABS: 4.0000 mg | ORAL_TABLET | Freq: Four times a day (QID) | ORAL | Status: DC | PRN

## 2019-05-16 MED ORDER — SODIUM CHLORIDE 0.9 % IV SOLN: 100.0000 mg | Freq: Every day | INTRAVENOUS | Status: DC

## 2019-05-16 MED ORDER — SODIUM CHLORIDE 0.9 % IV SOLN
1.0000 g | Freq: Every day | INTRAVENOUS | Status: DC
  Administered 2019-05-17 – 2019-05-20 (×5): 1 g via INTRAVENOUS
  Filled 2019-05-16 (×5): qty 10

## 2019-05-16 MED ORDER — SODIUM CHLORIDE 0.9 % IV SOLN: 200.0000 mg | Freq: Once | INTRAVENOUS | Status: DC

## 2019-05-16 MED ORDER — ENOXAPARIN SODIUM 40 MG/0.4ML ~~LOC~~ SOLN
40.0000 mg | Freq: Every day | SUBCUTANEOUS | Status: DC
  Administered 2019-05-17 – 2019-05-24 (×8): 40 mg via SUBCUTANEOUS
  Filled 2019-05-16 (×8): qty 0.4

## 2019-05-16 MED ORDER — LOSARTAN POTASSIUM 50 MG PO TABS
100.0000 mg | ORAL_TABLET | Freq: Every day | ORAL | Status: DC
  Administered 2019-05-17 – 2019-05-23 (×7): 100 mg via ORAL
  Filled 2019-05-16 (×7): qty 2

## 2019-05-16 MED ORDER — DEXAMETHASONE SODIUM PHOSPHATE 10 MG/ML IJ SOLN
8.0000 mg | Freq: Once | INTRAMUSCULAR | Status: AC
  Administered 2019-05-16: 8 mg via INTRAVENOUS
  Filled 2019-05-16: qty 1

## 2019-05-16 MED ORDER — ZINC SULFATE 220 (50 ZN) MG PO CAPS
220.0000 mg | ORAL_CAPSULE | Freq: Every day | ORAL | Status: DC
  Administered 2019-05-17 – 2019-05-24 (×8): 220 mg via ORAL
  Filled 2019-05-16 (×8): qty 1

## 2019-05-16 MED ORDER — ACETAMINOPHEN 325 MG PO TABS: 650.0000 mg | ORAL_TABLET | Freq: Four times a day (QID) | ORAL | Status: DC | PRN

## 2019-05-16 MED ORDER — GUAIFENESIN-DM 100-10 MG/5ML PO SYRP: 10.0000 mL | ORAL_SOLUTION | ORAL | Status: DC | PRN

## 2019-05-16 MED ORDER — HYDROCOD POLST-CPM POLST ER 10-8 MG/5ML PO SUER
5.0000 mL | Freq: Two times a day (BID) | ORAL | Status: DC | PRN
  Administered 2019-05-19: 5 mL via ORAL
  Filled 2019-05-16: qty 5

## 2019-05-16 MED ORDER — ONDANSETRON HCL 4 MG/2ML IJ SOLN: 4.0000 mg | Freq: Four times a day (QID) | INTRAMUSCULAR | Status: DC | PRN

## 2019-05-16 MED ORDER — ASCORBIC ACID 500 MG PO TABS
500.0000 mg | ORAL_TABLET | Freq: Every day | ORAL | Status: DC
  Administered 2019-05-17 – 2019-05-24 (×8): 500 mg via ORAL
  Filled 2019-05-16 (×8): qty 1

## 2019-05-16 MED ORDER — AMLODIPINE BESYLATE 10 MG PO TABS
10.0000 mg | ORAL_TABLET | Freq: Every day | ORAL | Status: DC
  Administered 2019-05-17 – 2019-05-24 (×8): 10 mg via ORAL
  Filled 2019-05-16 (×7): qty 1
  Filled 2019-05-16: qty 2

## 2019-05-16 MED ORDER — ONDANSETRON HCL 4 MG/2ML IJ SOLN
4.0000 mg | Freq: Once | INTRAMUSCULAR | Status: AC
  Administered 2019-05-16: 4 mg via INTRAVENOUS
  Filled 2019-05-16: qty 2

## 2019-05-16 MED ORDER — SODIUM CHLORIDE 0.9 % IV SOLN
200.0000 mg | Freq: Once | INTRAVENOUS | Status: AC
  Administered 2019-05-16: 22:00:00 200 mg via INTRAVENOUS
  Filled 2019-05-16: qty 40

## 2019-05-16 MED ORDER — SODIUM CHLORIDE 0.9 % IV SOLN: INTRAVENOUS | Status: DC

## 2019-05-16 MED ORDER — AZITHROMYCIN 250 MG PO TABS
500.0000 mg | ORAL_TABLET | Freq: Once | ORAL | Status: AC
  Administered 2019-05-17: 500 mg via ORAL
  Filled 2019-05-16: qty 2

## 2019-05-16 MED ORDER — DEXAMETHASONE SODIUM PHOSPHATE 10 MG/ML IJ SOLN
6.0000 mg | INTRAMUSCULAR | Status: DC
  Administered 2019-05-17 – 2019-05-23 (×8): 6 mg via INTRAVENOUS
  Filled 2019-05-16 (×9): qty 1

## 2019-05-16 MED ORDER — ACETAMINOPHEN 325 MG PO TABS
650.0000 mg | ORAL_TABLET | Freq: Once | ORAL | Status: AC
  Administered 2019-05-16: 650 mg via ORAL
  Filled 2019-05-16: qty 2

## 2019-05-16 MED ORDER — SODIUM CHLORIDE 0.9 % IV SOLN
100.0000 mg | Freq: Every day | INTRAVENOUS | Status: AC
  Administered 2019-05-17 – 2019-05-20 (×4): 100 mg via INTRAVENOUS
  Filled 2019-05-16 (×4): qty 20

## 2019-05-16 NOTE — H&P (Signed)
History and Physical   Devin Castro TKW:[id number] DOB: Oct 06, 1950 DOA: 05/16/2019  Referring MD/NP/PA: Dr. Roslynn Amble  PCP: Chesley Noon, MD   Outpatient Specialists: None  Patient coming from: Home  Chief Complaint: Shortness of breath  HPI: Devin Castro is a 68 y.o. male with medical history significant of COPD, pulmonary embolism on chronic anticoagulation, osteoarthritis, vertigo and recent diagnosis of COVID-19 pneumonia which was done last Monday.  Symptoms actually started Christmas eve.  He has been having upper respiratory infection.  He was having shortness of breath and cough.  Initially thought it was his COPD flaring up.  Patient went to the knee pain ER 2 days ago where he was tested and found to have COVID-19 positive.  He was offered admission at that time but oxygen was staying off.  Patient went home but came back today due to worsening shortness of breath and hypoxia.  He is now on 3 L of oxygen here.  Has been having cough.  Also fever and chills.  Patient being admitted therefore with COVID-19 pneumonia and new oxygen requirement..  ED Course: Temperature is 101.1, blood pressure 140/88 pulse 116 respirate 27 oxygen sat 85% on room air.  CBC and chemistry looks entirely within normal except for calcium 8.3.  LDH 395 troponin of 13 lactic acid 1.2.  Chest x-ray showed findings consistent with bilateral pneumonia.  Other markers for COVID-19 are currently pending.  Patient is requiring 3 L so is being admitted for treatment.  Review of Systems: As per HPI otherwise 10 point review of systems negative.    Past Medical History:  Diagnosis Date  . Arthritis   . COPD (chronic obstructive pulmonary disease) ([place])   . Hypertension   . Kidney stone   . PE (pulmonary embolism)   . Shortness of breath dyspnea   . Vertigo     Past Surgical History:  Procedure Laterality Date  . CARDIAC CATHETERIZATION    . COLONOSCOPY    . kidney stone removal    . TOTAL KNEE  ARTHROPLASTY Bilateral 06/04/2014   Procedure: TOTAL KNEE BILATERAL;  Surgeon: Meredith Pel, MD;  Location: [place];  Service: Orthopedics;  Laterality: Bilateral;     reports that he quit smoking about 16 years ago. He quit after 20.00 years of use. He has quit using smokeless tobacco. He reports that he does not drink alcohol or use drugs.  No Known Allergies  Family History  Problem Relation Age of Onset  . Cancer Mother   . Diabetes Father   . Cancer Sister      Prior to Admission medications   Medication Sig Start Date End Date Taking? Authorizing Provider  amLODipine (NORVASC) 10 MG tablet Take 10 mg by mouth daily. 05/07/19   [provider]  losartan (COZAAR) 100 MG tablet Take 100 mg by mouth daily.    [provider]  ondansetron (ZOFRAN ODT) 4 MG disintegrating tablet Take 1 tablet (4 mg total) by mouth every 8 (eight) hours as needed for up to 20 doses for nausea or vomiting. 05/14/19   Wyvonnia Dusky, MD    Physical Exam: Vitals:   05/16/19 2146 05/16/19 2215 05/16/19 2312 05/17/19 0015  BP: 93/68 117/82 112/68 110/76  Pulse: 94 87 80 79  Resp: (!) 25 20 (!) 24 (!) 27  Temp: 100.1 F (37.8 C)     TempSrc: Oral     SpO2: 91% 92% 94% 92%  Weight:  Height:          Constitutional: Acutely ill with significant anxiety Vitals:   05/16/19 2146 05/16/19 2215 05/16/19 2312 05/17/19 0015  BP: 93/68 117/82 112/68 110/76  Pulse: 94 87 80 79  Resp: (!) 25 20 (!) 24 (!) 27  Temp: 100.1 F (37.8 C)     TempSrc: Oral     SpO2: 91% 92% 94% 92%  Weight:      Height:       Eyes: PERRL, lids and conjunctivae normal ENMT: Mucous membranes are dry . Posterior pharynx clear of any exudate or lesions.Normal dentition.  Neck: normal, supple, no masses, no thyromegaly Respiratory: Decreased air entry bilaterally with some rhonchi and coarse breath sounds normal respiratory effort. No accessory muscle use.  Cardiovascular: Tachycardic, no murmurs  / rubs / gallops. No extremity edema. 2+ pedal pulses. No carotid bruits.  Abdomen: no tenderness, no masses palpated. No hepatosplenomegaly. Bowel sounds positive.  Musculoskeletal: no clubbing / cyanosis. No joint deformity upper and lower extremities. Good ROM, no contractures. Normal muscle tone.  Skin: no rashes, lesions, ulcers. No induration Neurologic: CN 2-12 grossly intact. Sensation intact, DTR normal. Strength 5/5 in all 4.  Psychiatric: Normal judgment and insight. Alert and oriented x 3. Normal mood.     Labs on Admission: I have personally reviewed following labs and imaging studies  CBC: Recent Labs  Lab 05/14/19 0813 05/16/19 1852  WBC 5.9 6.5  NEUTROABS 4.3  --   HGB 16.8 16.7  HCT 49.8 48.7  MCV 99.4 98.6  PLT 181 203   Basic Metabolic Panel: Recent Labs  Lab 05/14/19 0813 05/16/19 1852  NA 138 137  K 3.8 3.6  CL 99 101  CO2 24 24  GLUCOSE 97 104*  BUN 22 19  CREATININE 0.99 1.20  CALCIUM 8.6* 8.3*   GFR: Estimated Creatinine Clearance: 71.7 mL/min (by C-G formula based on SCr of 1.2 mg/dL). Liver Function Tests: Recent Labs  Lab 05/14/19 0813  AST 51*  ALT 51*  ALKPHOS 53  BILITOT 1.4*  PROT 7.3  ALBUMIN 3.4*   Recent Labs  Lab 05/14/19 0813  LIPASE 41   No results for input(s): AMMONIA in the last 168 hours. Coagulation Profile: No results for input(s): INR, PROTIME in the last 168 hours. Cardiac Enzymes: No results for input(s): CKTOTAL, CKMB, CKMBINDEX, TROPONINI in the last 168 hours. BNP (last 3 results) No results for input(s): PROBNP in the last 8760 hours. HbA1C: No results for input(s): HGBA1C in the last 72 hours. CBG: Recent Labs  Lab 05/16/19 2144  GLUCAP 128*   Lipid Profile: Recent Labs    05/16/19 1912  TRIG 80   Thyroid Function Tests: No results for input(s): TSH, T4TOTAL, FREET4, T3FREE, THYROIDAB in the last 72 hours. Anemia Panel: Recent Labs    05/16/19 1912  FERRITIN 846*   Urine analysis:     Component Value Date/Time   COLORURINE YELLOW 05/14/2019 0754   APPEARANCEUR CLEAR 05/14/2019 0754   LABSPEC >1.046 (H) 05/14/2019 0754   PHURINE 5.0 05/14/2019 0754   GLUCOSEU NEGATIVE 05/14/2019 0754   HGBUR LARGE (A) 05/14/2019 0754   BILIRUBINUR NEGATIVE 05/14/2019 0754   KETONESUR NEGATIVE 05/14/2019 0754   PROTEINUR NEGATIVE 05/14/2019 0754   UROBILINOGEN 0.2 05/24/2014 0843   NITRITE NEGATIVE 05/14/2019 0754   LEUKOCYTESUR NEGATIVE 05/14/2019 0754   Sepsis Labs: @LABRCNTIP (procalcitonin:4,lacticidven:4) ) Recent Results (from the past 240 hour(s))  Respiratory Panel by RT PCR (Flu A&B, Covid) - Nasopharyngeal Swab  Status: Abnormal   Collection Time: 05/14/19 12:25 PM   Specimen: Nasopharyngeal Swab  Result Value Ref Range Status   SARS Coronavirus 2 by RT PCR POSITIVE (A) NEGATIVE Final    Comment: RESULT CALLED TO, READ BACK BY AND VERIFIED WITH: WHITE,M@1501  BY MATTHEWS, B 12.28.2020 (NOTE) SARS-CoV-2 target nucleic acids are DETECTED. SARS-CoV-2 RNA is generally detectable in upper respiratory specimens  during the acute phase of infection. Positive results are indicative of the presence of the identified virus, but do not rule out bacterial infection or co-infection with other pathogens not detected by the test. Clinical correlation with patient history and other diagnostic information is necessary to determine patient infection status. The expected result is Negative. Fact Sheet for Patients:  https://www.moore.com/ Fact Sheet for Healthcare Providers: https://www.young.biz/ This test is not yet approved or cleared by the [place] FDA and  has been authorized for detection and/or diagnosis of SARS-CoV-2 by FDA under an Emergency Use Authorization (EUA).  This EUA will remain in effect (meaning this test can be used ) for the duration of  the COVID-19 declaration under Section 564(b)(1) of the Act, 21 U.S.C.  section 360bbb-3(b)(1), unless the authorization is terminated or revoked sooner.    Influenza A by PCR NEGATIVE NEGATIVE Final   Influenza B by PCR NEGATIVE NEGATIVE Final    Comment: (NOTE) The Xpert Xpress SARS-CoV-2/FLU/RSV assay is intended as an aid in  the diagnosis of influenza from Nasopharyngeal swab specimens and  should not be used as a sole basis for treatment. Nasal washings and  aspirates are unacceptable for Xpert Xpress SARS-CoV-2/FLU/RSV  testing. Fact Sheet for Patients: https://www.moore.com/ Fact Sheet for Healthcare Providers: https://www.young.biz/ This test is not yet approved or cleared by the [place] FDA and  has been authorized for detection and/or diagnosis of SARS-CoV-2 by  FDA under an Emergency Use Authorization (EUA). This EUA will remain  in effect (meaning this test can be used) for the duration of the  Covid-19 declaration under Section 564(b)(1) of the Act, 21  U.S.C. section 360bbb-3(b)(1), unless the authorization is  terminated or revoked. Performed at [facility], [address], [place], [place] [address]      Radiological Exams on Admission: DG Chest Port 1 View  Result Date: 05/16/2019 CLINICAL DATA:  Pneumonia EXAM: PORTABLE CHEST 1 VIEW COMPARISON:  CT chest 05/14/2019 FINDINGS: Bilateral patchy airspace disease with a peripheral predominance. No pleural effusion or pneumothorax. Stable cardiomediastinal silhouette. No aggressive osseous lesion. IMPRESSION: 1. Bilateral patchy airspace disease with peripheral predominance as can be seen with multilobar pneumonia including atypical viral pneumonia. Electronically Signed   By: Elige Ko   On: 05/16/2019 19:44     Assessment/Plan Principal Problem:   Pneumonia due to COVID-19 virus Active Problems:   Pulmonary embolism (HCC)   Hypertension   COPD (chronic obstructive pulmonary disease) (HCC)     #1 COVID-19 pneumonia: Patient will be  admitted to [facility].  He is on 3 L at the moment.  Initiate IV Decadron, remdesivir, antitussives as well as anticoagulation.  Monitor patient's response and progress.  Oxygen will be titrated as necessary.  #2 COPD exacerbation: Secondary to Covid pneumonia.  Patient will be maintained on steroids with antibiotics as well as inhalers.  Currently on oxygen.  Not on home O2.  #3 history of pulmonary embolism: Not on chronic anticoagulation.  #4 hypertension: Resume blood pressure medication from home and adjust as needed.   DVT prophylaxis: Lovenox Code Status: Full Family Communication: No  family at bedside Disposition Plan: Home Consults called: None Admission status: Inpatient  Severity of Illness: The appropriate patient status for this patient is INPATIENT. Inpatient status is judged to be reasonable and necessary in order to provide the required intensity of service to ensure the patient's safety. The patient's presenting symptoms, physical exam findings, and initial radiographic and laboratory data in the context of their chronic comorbidities is felt to place them at high risk for further clinical deterioration. Furthermore, it is not anticipated that the patient will be medically stable for discharge from the hospital within 2 midnights of admission. The following factors support the patient status of inpatient.   " The patient's presenting symptoms include shortness of breath and cough. " The worrisome physical exam findings include bilateral rhonchi and wheeze. " The initial radiographic and laboratory data are worrisome because of evidence of Covid pneumonia. " The chronic co-morbidities include hypertension COPD.   * I certify that at the point of admission it is my clinical judgment that the patient will require inpatient hospital care spanning beyond 2 midnights from the point of admission due to high intensity of service, high risk for further deterioration and high  frequency of surveillance required.Lonia Blood*    Sherlie Boyum,LAWAL MD [facility] Pager [phone]  If 7PM-7AM, please contact night-coverage [email or web address] Password [email or web address]  05/17/2019, 12:48 AM

## 2019-05-16 NOTE — ED Provider Notes (Addendum)
[name] Provider Note   CSN: [id number] Arrival date & time: 05/16/19  1828     History Chief Complaint  Patient presents with  . Shortness of Breath  . COVID +    Devin LeisureJames E Castro is a 68 y.o. male.  Presents to the emergency department with chief complaint shortness of breath.  Patient has had symptoms for approximately 6 days, diagnosed with [facility]-19 [facility].  Symptoms have included malaise, generalized weakness, nausea, poor appetite, no vomiting or abdominal pain, no chest pain.  States he has history of COPD, does not use inhalers on a regular basis,'s cough mostly nonproductive but has noted occasional blood-tinged sputum over the past 2 days.  Has history of PE related to Ortho surgery 5 years ago, no longer on anticoagulation.  States he has a pulse ox at home that has been reading low 80s throughout the day.   HPI     Past Medical History:  Diagnosis Date  . Arthritis   . COPD (chronic obstructive pulmonary disease) (HCC)   . Hypertension   . Kidney stone   . PE (pulmonary embolism)   . Shortness of breath dyspnea   . Vertigo     Patient Active Problem List   Diagnosis Date Noted  . PE (pulmonary embolism)   . Pulmonary embolism (HCC) 06/15/2014  . Hypertension 06/15/2014  . COPD (chronic obstructive pulmonary disease) (HCC) 06/15/2014  . Arthritis of knee 06/04/2014    Past Surgical History:  Procedure Laterality Date  . CARDIAC CATHETERIZATION    . COLONOSCOPY    . kidney stone removal    . TOTAL KNEE ARTHROPLASTY Bilateral 06/04/2014   Procedure: TOTAL KNEE BILATERAL;  Surgeon: Cammy CopaGregory Scott Dean, MD;  [facility];  Service: Orthopedics;  Laterality: Bilateral;       Family History  Problem Relation Age of Onset  . Cancer Mother   . Diabetes Father   . Cancer Sister     Social History   Tobacco Use  . Smoking status: Former Smoker    Years: 20.00    Quit date: 08/16/2002   Years since quitting: 16.7  . Smokeless tobacco: Former Engineer, water[employer]  Substance Use Topics  . Alcohol use: No  . Drug use: No    Home Medications Prior to Admission medications   Medication Sig Start Date End Date Taking? Authorizing Provider  amLODipine (NORVASC) 10 MG tablet Take 10 mg by mouth daily. 05/07/19   [provider]  losartan (COZAAR) 100 MG tablet Take 100 mg by mouth daily.    [provider]  ondansetron (ZOFRAN ODT) 4 MG disintegrating tablet Take 1 tablet (4 mg total) by mouth every 8 (eight) hours as needed for up to 20 doses for nausea or vomiting. 05/14/19   Trifan, Kermit BaloMatthew J, MD    Allergies    Patient has no known allergies.  Review of Systems   Review of Systems  Constitutional: Positive for chills, fatigue and fever.  HENT: Negative for ear pain and sore throat.   Eyes: Negative for pain and visual disturbance.  Respiratory: Positive for cough and shortness of breath.   Cardiovascular: Negative for chest pain and palpitations.  Gastrointestinal: Negative for abdominal pain and vomiting.  Genitourinary: Negative for dysuria and hematuria.  Musculoskeletal: Negative for arthralgias and back pain.  Skin: Negative for color change and rash.  Neurological: Negative for seizures and syncope.  All other systems reviewed and are  negative.   Physical Exam Updated Vital Signs BP (!) 142/88   Pulse 99   Temp (!) 101.1 F (38.4 C) (Oral)   Resp (!) 21   Ht 5\' 10"  (1.778 m)   Wt 99.8 kg   SpO2 92%   BMI 31.57 kg/m   Physical Exam Vitals and nursing note reviewed.  Constitutional:      Appearance: He is well-developed.  HENT:     Head: Normocephalic and atraumatic.  Eyes:     Conjunctiva/sclera: Conjunctivae normal.  Cardiovascular:     Rate and Rhythm: Normal rate and regular rhythm.     Heart sounds: No murmur.  Pulmonary:     Effort: No accessory muscle usage or respiratory distress.     Comments: Mild tachypnea, coarse breath sounds  b/l, no accessory muscle use, speaks in full sentences Abdominal:     Palpations: Abdomen is soft.     Tenderness: There is no abdominal tenderness.  Musculoskeletal:     Cervical back: Neck supple.  Skin:    General: Skin is warm and dry.  Neurological:     Mental Status: He is alert.     ED Results / Procedures / Treatments   Labs (all labs ordered are listed, but only abnormal results are displayed) Labs Reviewed  BASIC METABOLIC PANEL - Abnormal; Notable for the following components:      Result Value   Glucose, Bld 104 (*)    Calcium 8.3 (*)    All other components within normal limits  LACTIC ACID, PLASMA - Abnormal; Notable for the following components:   Lactic Acid, Venous 2.0 (*)    All other components within normal limits  D-DIMER, QUANTITATIVE (NOT AT [facility]) - Abnormal; Notable for the following components:   D-Dimer, Quant 2.20 (*)    All other components within normal limits  FIBRINOGEN - Abnormal; Notable for the following components:   Fibrinogen >800 (*)    All other components within normal limits  LACTATE DEHYDROGENASE - Abnormal; Notable for the following components:   LDH 395 (*)    All other components within normal limits  CULTURE, BLOOD (ROUTINE X 2)  CULTURE, BLOOD (ROUTINE X 2)  CBC  PROCALCITONIN  BRAIN NATRIURETIC PEPTIDE  TRIGLYCERIDES  URINALYSIS, ROUTINE W REFLEX MICROSCOPIC  LACTIC ACID, PLASMA  FERRITIN  C-REACTIVE PROTEIN  CBG MONITORING, ED  TROPONIN I (HIGH SENSITIVITY)  TROPONIN I (HIGH SENSITIVITY)    EKG  EKG at 1842 on 12/30 showed sinus tachycardia rate 112, normal intervals, no acute ST segment or t wave changes   None  Radiology DG Chest Port 1 View  Result Date: 05/16/2019 CLINICAL DATA:  Pneumonia EXAM: PORTABLE CHEST 1 VIEW COMPARISON:  CT chest 05/14/2019 FINDINGS: Bilateral patchy airspace disease with a peripheral predominance. No pleural effusion or pneumothorax. Stable cardiomediastinal silhouette. No  aggressive osseous lesion. IMPRESSION: 1. Bilateral patchy airspace disease with peripheral predominance as can be seen with multilobar pneumonia including atypical viral pneumonia. Electronically Signed   By: Kathreen Devoid   On: 05/16/2019 19:44    Procedures .Critical Care Performed by: Lucrezia Starch, MD Authorized by: Lucrezia Starch, MD   Critical care provider statement:    Critical care time (minutes):  45   Critical care was necessary to treat or prevent imminent or life-threatening deterioration of the following conditions:  Respiratory failure   Critical care was time spent personally by me on the following activities:  Discussions with consultants, evaluation of patient's response to treatment,  examination of patient, ordering and performing treatments and interventions, ordering and review of laboratory studies, ordering and review of radiographic studies, pulse oximetry, re-evaluation of patient's condition, obtaining history from patient or surrogate and review of old charts   (including critical care time)  Medications Ordered in ED Medications  acetaminophen (TYLENOL) tablet 650 mg (has no administration in time range)  dexamethasone (DECADRON) injection 8 mg (8 mg Intravenous Given 05/16/19 1942)  acetaminophen (TYLENOL) tablet 650 mg (650 mg Oral Given 05/16/19 1943)  ondansetron (ZOFRAN) injection 4 mg (4 mg Intravenous Given 05/16/19 1942)    ED Course  I have reviewed the triage vital signs and the nursing notes.  Pertinent labs & imaging results that were available during my care of the patient were reviewed by me and considered in my medical decision making (see chart for details).  Clinical Course as of May 15 2102  Wed May 16, 2019  8563 Complete initial assessment, hypoxic on room air, known Covid, will need admission   [RD]  2101 Discussed with Mikeal Hawthorne who will admit   [RD]    Clinical Course User Index [RD] Milagros Loll, MD   MDM  Rules/Calculators/A&P                       68 year old male presents to ER with worsening cough, malaise, weakness and shortness of breath in setting of recent Covid diagnosis.  CXR consistent with atypical pneumonia.  Patient requiring 2 to 3 L oxygen.  Otherwise hypoxic down to low 80s on room air.  Believe needs admission for further management.  Will start steroids.  Consulted hospitalist who will accept to their service.     Dr. Mikeal Hawthorne accepting.   Final Clinical Impression(s) / ED Diagnoses Final diagnoses:  COVID-19  Acute respiratory failure with hypoxia [facility])    Rx / DC Orders ED Discharge Orders    None       Milagros Loll, MD 05/16/19 2103    Milagros Loll, MD 05/30/19 1428

## 2019-05-16 NOTE — ED Triage Notes (Signed)
Pt arrives POV for eval of shortness of breath, malaise, weakness after being dx'd w/ Covid on Monday. Pt reports onset of sx 12/24 and has worsened since that time. 85% on RA in triage, placed on 2 L [place] in triage.

## 2019-05-17 ENCOUNTER — Other Ambulatory Visit: Payer: Self-pay

## 2019-05-17 LAB — CBC WITH DIFFERENTIAL/PLATELET
Abs Immature Granulocytes: 0.04 10*3/uL (ref 0.00–0.07)
Basophils Absolute: 0 10*3/uL (ref 0.0–0.1)
Basophils Relative: 0 %
Eosinophils Absolute: 0 10*3/uL (ref 0.0–0.5)
Eosinophils Relative: 0 %
HCT: 44.1 % (ref 39.0–52.0)
Hemoglobin: 15 g/dL (ref 13.0–17.0)
Immature Granulocytes: 1 %
Lymphocytes Relative: 9 %
Lymphs Abs: 0.4 10*3/uL — ABNORMAL LOW (ref 0.7–4.0)
MCH: 33.6 pg (ref 26.0–34.0)
MCHC: 34 g/dL (ref 30.0–36.0)
MCV: 98.7 fL (ref 80.0–100.0)
Monocytes Absolute: 0.2 10*3/uL (ref 0.1–1.0)
Monocytes Relative: 5 %
Neutro Abs: 4.3 10*3/uL (ref 1.7–7.7)
Neutrophils Relative %: 85 %
Platelets: 188 10*3/uL (ref 150–400)
RBC: 4.47 MIL/uL (ref 4.22–5.81)
RDW: 13.2 % (ref 11.5–15.5)
WBC: 5 10*3/uL (ref 4.0–10.5)
nRBC: 0 % (ref 0.0–0.2)

## 2019-05-17 LAB — COMPREHENSIVE METABOLIC PANEL
ALT: 35 U/L (ref 0–44)
AST: 40 U/L (ref 15–41)
Albumin: 2.5 g/dL — ABNORMAL LOW (ref 3.5–5.0)
Alkaline Phosphatase: 42 U/L (ref 38–126)
Anion gap: 12 (ref 5–15)
BUN: 25 mg/dL — ABNORMAL HIGH (ref 8–23)
CO2: 22 mmol/L (ref 22–32)
Calcium: 8.1 mg/dL — ABNORMAL LOW (ref 8.9–10.3)
Chloride: 103 mmol/L (ref 98–111)
Creatinine, Ser: 1.49 mg/dL — ABNORMAL HIGH (ref 0.61–1.24)
GFR calc Af Amer: 56 mL/min — ABNORMAL LOW (ref >60)
GFR calc non Af Amer: 48 mL/min — ABNORMAL LOW (ref >60)
Glucose, Bld: 154 mg/dL — ABNORMAL HIGH (ref 70–99)
Potassium: 3.9 mmol/L (ref 3.5–5.1)
Sodium: 137 mmol/L (ref 135–145)
Total Bilirubin: 0.7 mg/dL (ref 0.3–1.2)
Total Protein: 6.4 g/dL — ABNORMAL LOW (ref 6.5–8.1)

## 2019-05-17 LAB — HIV ANTIBODY (ROUTINE TESTING W REFLEX): HIV Screen 4th Generation wRfx: NONREACTIVE

## 2019-05-17 LAB — CBC
HCT: 44.3 % (ref 39.0–52.0)
Hemoglobin: 15.5 g/dL (ref 13.0–17.0)
MCH: 34.1 pg — ABNORMAL HIGH (ref 26.0–34.0)
MCHC: 35 g/dL (ref 30.0–36.0)
MCV: 97.6 fL (ref 80.0–100.0)
Platelets: 185 10*3/uL (ref 150–400)
RBC: 4.54 MIL/uL (ref 4.22–5.81)
RDW: 14.1 % (ref 11.5–15.5)
WBC: 5.1 10*3/uL (ref 4.0–10.5)
nRBC: 0 % (ref 0.0–0.2)

## 2019-05-17 LAB — URINALYSIS, ROUTINE W REFLEX MICROSCOPIC
Bacteria, UA: NONE SEEN
Bilirubin Urine: NEGATIVE
Glucose, UA: NEGATIVE mg/dL
Ketones, ur: NEGATIVE mg/dL
Leukocytes,Ua: NEGATIVE
Nitrite: NEGATIVE
Protein, ur: NEGATIVE mg/dL
RBC / HPF: >50 RBC/hpf — ABNORMAL HIGH (ref 0–5)
Specific Gravity, Urine: 1.017 (ref 1.005–1.030)
pH: 5 (ref 5.0–8.0)

## 2019-05-17 LAB — C-REACTIVE PROTEIN: CRP: 17.1 mg/dL — ABNORMAL HIGH (ref <1.0)

## 2019-05-17 LAB — FERRITIN: Ferritin: 720 ng/mL — ABNORMAL HIGH (ref 24–336)

## 2019-05-17 LAB — CREATININE, SERUM
Creatinine, Ser: 1.45 mg/dL — ABNORMAL HIGH (ref 0.61–1.24)
GFR calc Af Amer: 58 mL/min — ABNORMAL LOW (ref >60)
GFR calc non Af Amer: 50 mL/min — ABNORMAL LOW (ref >60)

## 2019-05-17 LAB — D-DIMER, QUANTITATIVE: D-Dimer, Quant: 2.03 ug/mL-FEU — ABNORMAL HIGH (ref 0.00–0.50)

## 2019-05-17 NOTE — ED Notes (Signed)
Lunch Tray Ordered @ 1108.  

## 2019-05-17 NOTE — ED Notes (Signed)
+  tele  Breakfast ordered 

## 2019-05-17 NOTE — Progress Notes (Signed)
Hospitalist daily note   Devin Castro [id number] DOB: [birth year] DOA: 05/16/2019  PCP: Chesley Noon, MD   Narrative:  89 white male known history of COPD not on oxygen HTN, status post bilateral knee replacement 06/09/2014, pulmonary embolism 2016, hyperlipidemia Patient came to emergency room initially 05/14/2019 with fatigue and nausea in the setting of multiple family members reporting positive for Covid-he was positive also for coronavirus and came back to the emergency room 12/30 O2 sats were found to be low 80s he required 2 to 3 L of oxygen-steroids were started by ED Patient was started on remdesivir in addition   Data Reviewed:  BUN/creatinine 25/1.49 [baseline less than 1.0] LFTs within normal limits Anion gap 12 Ferritin 720,  CRP 18.8-->17  Lactic acid 2.0-->1.2, procalcitonin 0.31 LDH 395 troponin XI-->13 D-dimer 2.20-->2.03 Assessment & Plan: Coronavirus 19 pneumonia Patient started on remdesivir 12/30 needs to complete on 1/3 Continue steroids until 1/8 Requiring minimal oxygen at this time 1 to 2 L and may be able to down titrate and complete course in the outpatient setting cycle inflammatory markers a.m. Acute hypoxic respiratory failure Worse since he was diagnosed on 12/28-continue to down titrate as needed  COPD Continue Combivent 1 puff every 6, Robitussin 10 mils every 4 as needed for cough Prior pulmonary embolism 2016 Monitor trends carefully given her prior history dimer is only 2.0 CT chest did not confirm PE 12/28 Bilateral knee replacements 2016 Stable at this time HLD No home meds monitor trends HTN Continue amlodipine 10 losartan 100 Mild obesity    Subjective: Well no n/v/cp Oxygen remained above 93 on lower am,ount of oxygen 2 liters down from 4 Consultants:   None Procedures:   None Antimicrobials:   Remdesivir 12/30   Objective: Vitals:   05/17/19 0630 05/17/19 0645 05/17/19 0700 05/17/19 0715  BP: 114/77 125/79 114/78    Pulse: 67 66 68 68  Resp: (!) 24 20 (!) 25 (!) 28  Temp:      TempSrc:      SpO2: 91% 91% 91% 92%  Weight:      Height:       No intake or output data in the 24 hours ending 05/17/19 0745 Filed Weights   05/16/19 1845  Weight: 99.8 kg    Examination: Awake alert coherent no distress EOMI NCAT no focal deficit Chest seems to have a little bit more coarse lung fields on L >R no wheeze Abdomen soft no rebound no guarding No lower extremity edema Neurologically intact  Scheduled Meds: . amLODipine  10 mg Oral Daily  . vitamin C  500 mg Oral Daily  . dexamethasone (DECADRON) injection  6 mg Intravenous Q24H  . enoxaparin (LOVENOX) injection  40 mg Subcutaneous Daily  . Ipratropium-Albuterol  1 puff Inhalation Q6H  . losartan  100 mg Oral Daily  . zinc sulfate  220 mg Oral Daily   Continuous Infusions: . sodium chloride 75 mL/hr at 05/16/19 2309  . cefTRIAXone (ROCEPHIN)  IV Stopped (05/17/19 0138)  . remdesivir 100 mg in NS 100 mL       LOS: 1 day   Time spent: [place], MD [facility]

## 2019-05-17 NOTE — ED Notes (Signed)
ED TO INPATIENT HANDOFF REPORT  ED Nurse Name and Phone #: Verl Whitmore [phone]  S Name/Age/Gender Devin Castro 68 y.o. male Room/Bed: 014C/014C  Code Status   Code Status: Full Code  Home/SNF/Other Home Patient oriented to: self, place, time and situation Is this baseline? Yes   Triage Complete: Triage complete  Chief Complaint Pneumonia due to COVID-19 virus [U07.1, J12.89]  Triage Note Pt arrives POV for eval of shortness of breath, malaise, weakness after being dx'd w/ Covid on Monday. Pt reports onset of sx 12/24 and has worsened since that time. 85% on RA in triage, placed on 2 L [place] in triage.      Allergies No Known Allergies  Level of Care/Admitting Diagnosis ED Disposition    ED Disposition Condition Comment   Admit  Hospital Area: [facility] [[id number]]  Level of Care: Telemetry Medical [104]  Covid Evaluation: Confirmed COVID Positive  Diagnosis: Pneumonia due to COVID-19 virus [[id number]]  Admitting Physician: Elwyn Reach [[id number]]  Attending Physician: Elwyn Reach [[id number]]  Estimated length of stay: past midnight tomorrow  Certification:: I certify this patient will need inpatient services for at least 2 midnights       B Medical/Surgery History Past Medical History:  Diagnosis Date  . Arthritis   . COPD (chronic obstructive pulmonary disease) ([place])   . Hypertension   . Kidney stone   . PE (pulmonary embolism)   . Shortness of breath dyspnea   . Vertigo    Past Surgical History:  Procedure Laterality Date  . CARDIAC CATHETERIZATION    . COLONOSCOPY    . kidney stone removal    . TOTAL KNEE ARTHROPLASTY Bilateral 06/04/2014   Procedure: TOTAL KNEE BILATERAL;  Surgeon: Meredith Pel, MD;  Location: [facility];  Service: Orthopedics;  Laterality: Bilateral;     A IV Location/Drains/Wounds Patient Lines/Drains/Airways Status   Active Line/Drains/Airways    Name:   Placement date:   Placement time:   Site:   Days:    Peripheral IV 05/16/19 Right Antecubital   05/16/19    1941    Antecubital   1   Incision (Closed) 06/04/14 Knee Right   06/04/14    1610     1808   Incision (Closed) 06/04/14 Knee Left   06/04/14    1610     1808          Intake/Output Last 24 hours  Intake/Output Summary (Last 24 hours) at 05/17/2019 1758 Last data filed at 05/17/2019 1531 Gross per 24 hour  Intake --  Output 400 ml  Net -400 ml    Labs/Imaging Results for orders placed or performed during the hospital encounter of 05/16/19 (from the past 48 hour(s))  Blood Culture (routine x 2)     Status: None (Preliminary result)   Collection Time: 05/16/19  6:50 PM   Specimen: BLOOD  Result Value Ref Range   Specimen Description BLOOD LEFT ANTECUBITAL    Special Requests      BOTTLES DRAWN AEROBIC AND ANAEROBIC Blood Culture results may not be optimal due to an inadequate volume of blood received in culture bottles   Culture      NO GROWTH < 24 HOURS Performed at [facility] [address] [place],  [address]    Report Status PENDING   Basic metabolic panel     Status: Abnormal   Collection Time: 05/16/19  6:52 PM  Result Value Ref Range   Sodium 137 135 -  145 mmol/L   Potassium 3.6 3.5 - 5.1 mmol/L   Chloride 101 98 - 111 mmol/L   CO2 24 22 - 32 mmol/L   Glucose, Bld 104 (H) 70 - 99 mg/dL   BUN 19 8 - 23 mg/dL   Creatinine, Ser 4.09 0.61 - 1.24 mg/dL   Calcium 8.3 (L) 8.9 - 10.3 mg/dL   GFR calc non Af Amer >60 >60 mL/min   GFR calc Af Amer >60 >60 mL/min   Anion gap 12 5 - 15    Comment: Performed at [facility], [address], [place], [place] [address]  CBC     Status: None   Collection Time: 05/16/19  6:52 PM  Result Value Ref Range   WBC 6.5 4.0 - 10.5 K/uL   RBC 4.94 4.22 - 5.81 MIL/uL   Hemoglobin 16.7 13.0 - 17.0 g/dL   HCT 47.8 29.5 - 62.1 %   MCV 98.6 80.0 - 100.0 fL   MCH 33.8 26.0 - 34.0 pg   MCHC 34.3 30.0 - 36.0 g/dL   RDW 30.8 65.7 - 84.6 %   Platelets 203 150 -  400 K/uL   nRBC 0.0 0.0 - 0.2 %    Comment: Performed at [facility], [address], [place], [place] [address]  D-dimer, quantitative     Status: Abnormal   Collection Time: 05/16/19  7:12 PM  Result Value Ref Range   D-Dimer, Quant 2.20 (H) 0.00 - 0.50 ug/mL-FEU    Comment: (NOTE) At the manufacturer cut-off of 0.50 ug/mL FEU, this assay has been documented to exclude PE with a sensitivity and negative predictive value of 97 to 99%.  At this time, this assay has not been approved by the FDA to exclude DVT/VTE. Results should be correlated with clinical presentation. Performed at [facility], [address], [place], [place] [address]   Procalcitonin     Status: None   Collection Time: 05/16/19  7:12 PM  Result Value Ref Range   Procalcitonin 0.31 ng/mL    Comment:        Interpretation: PCT (Procalcitonin) <= 0.5 ng/mL: Systemic infection (sepsis) is not likely. Local bacterial infection is possible. (NOTE)       Sepsis PCT Algorithm           Lower Respiratory Tract                                      Infection PCT Algorithm    ----------------------------     ----------------------------         PCT < 0.25 ng/mL                PCT < 0.10 ng/mL         Strongly encourage             Strongly discourage   discontinuation of antibiotics    initiation of antibiotics    ----------------------------     -----------------------------       PCT 0.25 - 0.50 ng/mL            PCT 0.10 - 0.25 ng/mL               OR       >80% decrease in PCT            Discourage initiation of  antibiotics      Encourage discontinuation           of antibiotics    ----------------------------     -----------------------------         PCT >= 0.50 ng/mL              PCT 0.26 - 0.50 ng/mL               AND        <80% decrease in PCT             Encourage initiation of                                             antibiotics       Encourage  continuation           of antibiotics    ----------------------------     -----------------------------        PCT >= 0.50 ng/mL                  PCT > 0.50 ng/mL               AND         increase in PCT                  Strongly encourage                                      initiation of antibiotics    Strongly encourage escalation           of antibiotics                                     -----------------------------                                           PCT <= 0.25 ng/mL                                                 OR                                        > 80% decrease in PCT                                     Discontinue / Do not initiate                                             antibiotics Performed at [facility], [address], [place], [place] [address]   Ferritin     Status: Abnormal   Collection Time: 05/16/19  7:12 PM  Result Value Ref Range   Ferritin 846 (H) 24 - 336 ng/mL    Comment: Performed at [facility], [address] [place], [place] [address]  Fibrinogen     Status: Abnormal   Collection Time: 05/16/19  7:12 PM  Result Value Ref Range   Fibrinogen >800 (H) 210 - 475 mg/dL    Comment: REPEATED TO VERIFY Performed at [facility], [address], [place], [place] [address]   C-reactive protein     Status: Abnormal   Collection Time: 05/16/19  7:12 PM  Result Value Ref Range   CRP 18.8 (H) <1.0 mg/dL    Comment: Performed at [facility], [address] [place], [place] [address]  Troponin I (High Sensitivity)     Status: None   Collection Time: 05/16/19  7:12 PM  Result Value Ref Range   Troponin I (High Sensitivity) 11 <18 ng/L    Comment: (NOTE) Elevated high sensitivity troponin I (hsTnI) values and significant  changes across serial measurements may suggest ACS but many other  chronic and acute conditions are known to elevate hsTnI results.  Refer to the "Links" section for chest pain algorithms and  additional  guidance. Performed at [facility], [address], [place], [place] [address]   Triglycerides     Status: None   Collection Time: 05/16/19  7:12 PM  Result Value Ref Range   Triglycerides 80 <150 mg/dL    Comment: Performed at [facility], [address] [place], [place] [address]  Brain natriuretic peptide     Status: None   Collection Time: 05/16/19  7:30 PM  Result Value Ref Range   B Natriuretic Peptide 25.8 0.0 - 100.0 pg/mL    Comment: Performed at [facility], [address] [place], [place] [address]  Lactic acid, plasma     Status: Abnormal   Collection Time: 05/16/19  7:44 PM  Result Value Ref Range   Lactic Acid, Venous 2.0 (HH) 0.5 - 1.9 mmol/L    Comment: CRITICAL RESULT CALLED TO, READ BACK BY AND VERIFIED WITH: RN Daiva Huge  05/16/19 BY S GEZAHEGN Performed at [facility], [address] [place], [place] [address]   Lactate dehydrogenase     Status: Abnormal   Collection Time: 05/16/19  7:55 PM  Result Value Ref Range   LDH 395 (H) 98 - 192 U/L    Comment: Performed at [facility], [address], [place], [place] [address]  Blood Culture (routine x 2)     Status: None (Preliminary result)   Collection Time: 05/16/19  8:14 PM   Specimen: BLOOD  Result Value Ref Range   Specimen Description BLOOD LEFT ANTECUBITAL    Special Requests      BOTTLES DRAWN AEROBIC ONLY Blood Culture adequate volume   Culture      NO GROWTH < 24 HOURS Performed at [facility], [address], [place], [place] [address]    Report Status PENDING   Lactic acid, plasma     Status: None   Collection Time: 05/16/19  9:12 PM  Result Value Ref Range   Lactic Acid, Venous 1.2 0.5 - 1.9 mmol/L    Comment: Performed at [facility], [address] [place], [place] [address]  Troponin I (High Sensitivity)     Status: None   Collection Time: 05/16/19  9:13 PM  Result  Value Ref Range   Troponin I (High Sensitivity) 13  <18 ng/L    Comment: (NOTE) Elevated high sensitivity troponin I (hsTnI) values and significant  changes across serial measurements may suggest ACS but many other  chronic and acute conditions are known to elevate hsTnI results.  Refer to the "Links" section for chest pain algorithms and additional  guidance. Performed at [facility], [address] [place], [place] [address]   CBG monitoring, ED     Status: Abnormal   Collection Time: 05/16/19  9:44 PM  Result Value Ref Range   Glucose-Capillary 128 (H) 70 - 99 mg/dL  HIV Antibody (routine testing w rflx)     Status: None   Collection Time: 05/17/19  1:48 AM  Result Value Ref Range   HIV Screen 4th Generation wRfx NON REACTIVE NON REACTIVE    Comment: Performed at [facility], [address] [place], [place] [address]  CBC     Status: Abnormal   Collection Time: 05/17/19  1:48 AM  Result Value Ref Range   WBC 5.1 4.0 - 10.5 K/uL   RBC 4.54 4.22 - 5.81 MIL/uL   Hemoglobin 15.5 13.0 - 17.0 g/dL   HCT 82.944.3 56.239.0 - 13.052.0 %   MCV 97.6 80.0 - 100.0 fL   MCH 34.1 (H) 26.0 - 34.0 pg   MCHC 35.0 30.0 - 36.0 g/dL   RDW 86.514.1 78.411.5 - 69.615.5 %   Platelets 185 150 - 400 K/uL   nRBC 0.0 0.0 - 0.2 %    Comment: Performed at [facility], [address] [place], [place] [address]  Creatinine, serum     Status: Abnormal   Collection Time: 05/17/19  1:48 AM  Result Value Ref Range   Creatinine, Ser 1.45 (H) 0.61 - 1.24 mg/dL   GFR calc non Af Amer 50 (L) >60 mL/min   GFR calc Af Amer 58 (L) >60 mL/min    Comment: Performed at [facility], [address] [place], [place] [address]  Comprehensive metabolic panel     Status: Abnormal   Collection Time: 05/17/19  2:47 AM  Result Value Ref Range   Sodium 137 135 - 145 mmol/L   Potassium 3.9 3.5 - 5.1 mmol/L   Chloride 103 98 - 111 mmol/L   CO2 22 22 - 32 mmol/L   Glucose, Bld 154 (H) 70 - 99 mg/dL   BUN 25 (H) 8 - 23 mg/dL   Creatinine, Ser 4.011.49 (H) 0.61 - 1.24  mg/dL   Calcium 8.1 (L) 8.9 - 10.3 mg/dL   Total Protein 6.4 (L) 6.5 - 8.1 g/dL   Albumin 2.5 (L) 3.5 - 5.0 g/dL   AST 40 15 - 41 U/L   ALT 35 0 - 44 U/L   Alkaline Phosphatase 42 38 - 126 U/L   Total Bilirubin 0.7 0.3 - 1.2 mg/dL   GFR calc non Af Amer 48 (L) >60 mL/min   GFR calc Af Amer 56 (L) >60 mL/min   Anion gap 12 5 - 15    Comment: Performed at [facility], [address] [place], [place] [address]  CBC with Differential/Platelet     Status: Abnormal   Collection Time: 05/17/19  2:47 AM  Result Value Ref Range   WBC 5.0 4.0 - 10.5 K/uL   RBC 4.47 4.22 - 5.81 MIL/uL   Hemoglobin 15.0 13.0 - 17.0 g/dL   HCT 36.644.1 44.039.0 - 34.752.0 %  MCV 98.7 80.0 - 100.0 fL   MCH 33.6 26.0 - 34.0 pg   MCHC 34.0 30.0 - 36.0 g/dL   RDW 16.1 09.6 - 04.5 %   Platelets 188 150 - 400 K/uL   nRBC 0.0 0.0 - 0.2 %   Neutrophils Relative % 85 %   Neutro Abs 4.3 1.7 - 7.7 K/uL   Lymphocytes Relative 9 %   Lymphs Abs 0.4 (L) 0.7 - 4.0 K/uL   Monocytes Relative 5 %   Monocytes Absolute 0.2 0.1 - 1.0 K/uL   Eosinophils Relative 0 %   Eosinophils Absolute 0.0 0.0 - 0.5 K/uL   Basophils Relative 0 %   Basophils Absolute 0.0 0.0 - 0.1 K/uL   Immature Granulocytes 1 %   Abs Immature Granulocytes 0.04 0.00 - 0.07 K/uL    Comment: Performed at [facility], [address], [place], [place] [address]  C-reactive protein     Status: Abnormal   Collection Time: 05/17/19  2:47 AM  Result Value Ref Range   CRP 17.1 (H) <1.0 mg/dL    Comment: Performed at [facility], [address], [place], [place] [address]  D-dimer, quantitative (not at [facility])     Status: Abnormal   Collection Time: 05/17/19  2:47 AM  Result Value Ref Range   D-Dimer, Quant 2.03 (H) 0.00 - 0.50 ug/mL-FEU    Comment: (NOTE) At the manufacturer cut-off of 0.50 ug/mL FEU, this assay has been documented to exclude PE with a sensitivity and negative predictive value of 97 to 99%.  At this time, this assay has not been  approved by the FDA to exclude DVT/VTE. Results should be correlated with clinical presentation. Performed at [facility], [address] [place], [place] [address]   Ferritin     Status: Abnormal   Collection Time: 05/17/19  2:47 AM  Result Value Ref Range   Ferritin 720 (H) 24 - 336 ng/mL    Comment: Performed at [facility], [address], [place], [place] [address]  Urinalysis, Routine w reflex microscopic     Status: Abnormal   Collection Time: 05/17/19  4:46 AM  Result Value Ref Range   Color, Urine AMBER (A) YELLOW    Comment: BIOCHEMICALS MAY BE AFFECTED BY COLOR   APPearance HAZY (A) CLEAR   Specific Gravity, Urine 1.017 1.005 - 1.030   pH 5.0 5.0 - 8.0   Glucose, UA NEGATIVE NEGATIVE mg/dL   Hgb urine dipstick LARGE (A) NEGATIVE   Bilirubin Urine NEGATIVE NEGATIVE   Ketones, ur NEGATIVE NEGATIVE mg/dL   Protein, ur NEGATIVE NEGATIVE mg/dL   Nitrite NEGATIVE NEGATIVE   Leukocytes,Ua NEGATIVE NEGATIVE   RBC / HPF >50 (H) 0 - 5 RBC/hpf   WBC, UA 0-5 0 - 5 WBC/hpf   Bacteria, UA NONE SEEN NONE SEEN   Mucus PRESENT    Hyaline Casts, UA PRESENT     Comment: Performed at [facility], [address], [place], [place] [address]   DG Chest Port 1 View  Result Date: 05/16/2019 CLINICAL DATA:  Pneumonia EXAM: PORTABLE CHEST 1 VIEW COMPARISON:  CT chest 05/14/2019 FINDINGS: Bilateral patchy airspace disease with a peripheral predominance. No pleural effusion or pneumothorax. Stable cardiomediastinal silhouette. No aggressive osseous lesion. IMPRESSION: 1. Bilateral patchy airspace disease with peripheral predominance as can be seen with multilobar pneumonia including atypical viral pneumonia. Electronically Signed   By: Elige Ko   On: 05/16/2019 19:44  Pending Labs Unresulted Labs (From admission, onward)    Start     Ordered   05/23/19 0500  Creatinine, serum  (enoxaparin (LOVENOX)    CrCl >/= 30 ml/min)  Weekly,   R    Comments: while on  enoxaparin therapy    05/16/19 2251   05/17/19 0500  CBC with Differential/Platelet  Daily,   R     05/16/19 2251   05/17/19 0500  Comprehensive metabolic panel  Daily,   R     05/16/19 2251   05/17/19 0500  C-reactive protein  Daily,   R     05/16/19 2251   05/17/19 0500  D-dimer, quantitative (not at [facility])  Daily,   R     05/16/19 2251   05/17/19 0500  Ferritin  Daily,   R     05/16/19 2251          Vitals/Pain Today's Vitals   05/17/19 1658 05/17/19 1700 05/17/19 1715 05/17/19 1730  BP:  128/79 115/73 130/80  Pulse:  80 81 85  Resp:  (!) 26 20 20   Temp:      TempSrc:      SpO2: 90% 90% (!) 89% (!) 88%  Weight:      Height:      PainSc:        Isolation Precautions Airborne and Contact precautions  Medications Medications  acetaminophen (TYLENOL) tablet 650 mg (has no administration in time range)  losartan (COZAAR) tablet 100 mg (100 mg Oral Given 05/17/19 1220)  amLODipine (NORVASC) tablet 10 mg (10 mg Oral Given 05/17/19 1221)  ondansetron (ZOFRAN-ODT) disintegrating tablet 4 mg (has no administration in time range)  enoxaparin (LOVENOX) injection 40 mg (40 mg Subcutaneous Given 05/17/19 1229)  0.9 %  sodium chloride infusion ( Intravenous New Bag/Given 05/16/19 2309)  Ipratropium-Albuterol (COMBIVENT) respimat 1 puff (1 puff Inhalation Not Given 05/17/19 1301)  dexamethasone (DECADRON) injection 6 mg (6 mg Intravenous Given 05/17/19 0139)  guaiFENesin-dextromethorphan (ROBITUSSIN DM) 100-10 MG/5ML syrup 10 mL (has no administration in time range)  chlorpheniramine-HYDROcodone (TUSSIONEX) 10-8 MG/5ML suspension 5 mL (has no administration in time range)  ascorbic acid (VITAMIN C) tablet 500 mg (500 mg Oral Given 05/17/19 1221)  zinc sulfate capsule 220 mg (220 mg Oral Given 05/17/19 1220)  acetaminophen (TYLENOL) tablet 650 mg (has no administration in time range)  ondansetron (ZOFRAN) tablet 4 mg (has no administration in time range)    Or  ondansetron  (ZOFRAN) injection 4 mg (has no administration in time range)  cefTRIAXone (ROCEPHIN) 1 g in sodium chloride 0.9 % 100 mL IVPB (0 g Intravenous Stopped 05/17/19 0138)  remdesivir 200 mg in sodium chloride 0.9% 250 mL IVPB (0 mg Intravenous Stopped 05/16/19 2309)    Followed by  remdesivir 100 mg in sodium chloride 0.9 % 100 mL IVPB (0 mg Intravenous Stopped 05/17/19 1253)  dexamethasone (DECADRON) injection 8 mg (8 mg Intravenous Given 05/16/19 1942)  acetaminophen (TYLENOL) tablet 650 mg (650 mg Oral Given 05/16/19 1943)  ondansetron (ZOFRAN) injection 4 mg (4 mg Intravenous Given 05/16/19 1942)  azithromycin (ZITHROMAX) tablet 500 mg (500 mg Oral Given 05/17/19 0019)    Mobility walks Low fall risk      R Recommendations: See Admitting Provider Note  Report given to:   Additional Notes:

## 2019-05-18 ENCOUNTER — Inpatient Hospital Stay (HOSPITAL_COMMUNITY): Payer: PPO

## 2019-05-18 DIAGNOSIS — J1282 Pneumonia due to coronavirus disease 2019: Secondary | ICD-10-CM

## 2019-05-18 LAB — COMPREHENSIVE METABOLIC PANEL
ALT: 32 U/L (ref 0–44)
AST: 31 U/L (ref 15–41)
Albumin: 2.5 g/dL — ABNORMAL LOW (ref 3.5–5.0)
Alkaline Phosphatase: 42 U/L (ref 38–126)
Anion gap: 9 (ref 5–15)
BUN: 25 mg/dL — ABNORMAL HIGH (ref 8–23)
CO2: 26 mmol/L (ref 22–32)
Calcium: 8.5 mg/dL — ABNORMAL LOW (ref 8.9–10.3)
Chloride: 106 mmol/L (ref 98–111)
Creatinine, Ser: 0.99 mg/dL (ref 0.61–1.24)
GFR calc Af Amer: >60 mL/min (ref >60)
GFR calc non Af Amer: >60 mL/min (ref >60)
Glucose, Bld: 139 mg/dL — ABNORMAL HIGH (ref 70–99)
Potassium: 3.8 mmol/L (ref 3.5–5.1)
Sodium: 141 mmol/L (ref 135–145)
Total Bilirubin: 0.5 mg/dL (ref 0.3–1.2)
Total Protein: 6.5 g/dL (ref 6.5–8.1)

## 2019-05-18 LAB — CBC WITH DIFFERENTIAL/PLATELET
Abs Immature Granulocytes: 0.05 10*3/uL (ref 0.00–0.07)
Basophils Absolute: 0 10*3/uL (ref 0.0–0.1)
Basophils Relative: 0 %
Eosinophils Absolute: 0 10*3/uL (ref 0.0–0.5)
Eosinophils Relative: 0 %
HCT: 46 % (ref 39.0–52.0)
Hemoglobin: 15.5 g/dL (ref 13.0–17.0)
Immature Granulocytes: 1 %
Lymphocytes Relative: 6 %
Lymphs Abs: 0.5 10*3/uL — ABNORMAL LOW (ref 0.7–4.0)
MCH: 33.3 pg (ref 26.0–34.0)
MCHC: 33.7 g/dL (ref 30.0–36.0)
MCV: 98.7 fL (ref 80.0–100.0)
Monocytes Absolute: 0.4 10*3/uL (ref 0.1–1.0)
Monocytes Relative: 4 %
Neutro Abs: 8.3 10*3/uL — ABNORMAL HIGH (ref 1.7–7.7)
Neutrophils Relative %: 89 %
Platelets: 265 10*3/uL (ref 150–400)
RBC: 4.66 MIL/uL (ref 4.22–5.81)
RDW: 13 % (ref 11.5–15.5)
WBC: 9.3 10*3/uL (ref 4.0–10.5)
nRBC: 0 % (ref 0.0–0.2)

## 2019-05-18 LAB — D-DIMER, QUANTITATIVE: D-Dimer, Quant: 1.72 ug/mL-FEU — ABNORMAL HIGH (ref 0.00–0.50)

## 2019-05-18 LAB — FERRITIN: Ferritin: 994 ng/mL — ABNORMAL HIGH (ref 24–336)

## 2019-05-18 LAB — C-REACTIVE PROTEIN: CRP: 9.7 mg/dL — ABNORMAL HIGH (ref <1.0)

## 2019-05-18 MED ORDER — TOCILIZUMAB 400 MG/20ML IV SOLN
800.0000 mg | Freq: Once | INTRAVENOUS | Status: AC
  Administered 2019-05-18: 800 mg via INTRAVENOUS
  Filled 2019-05-18: qty 40

## 2019-05-18 NOTE — Progress Notes (Signed)
Patient sats 88% on 7L [place], proned patient, sats increased to 93%. Upon return to room, patient was supine, sitting upright in bed, sats 87% on 7Lnc. Patient stated he feels like he cannot breathe while proning. RN educated on importance and effectiveness of proning, patient verbalized understanding. Patient would like to stay sitting upright in bed. Patient now on 9L [place] sats 91%. MD notified.

## 2019-05-18 NOTE — Progress Notes (Signed)
PT Cancellation Note  Patient Details Name: Devin Castro MRN: [id number] DOB: 1950-08-14   Cancelled Treatment:    Reason Eval/Treat Not Completed: (P) Medical issues which prohibited therapy Pt with increased oxygen demand. PT will follow back tomorrow for Evaluation.  Suraya Vidrine B. Beverely Risen PT, DPT Acute Rehabilitation Services Pager [phone] Office [phone]    Elon Alas Fleet 05/18/2019, 4:31 PM

## 2019-05-18 NOTE — Progress Notes (Signed)
Hospitalist daily note   Devin Castro [id number] DOB: [birth year] DOA: 05/16/2019  PCP: Eartha Inch, MD   Narrative:  83 white male known history of COPD not on oxygen HTN, status post bilateral knee replacement 06/09/2014, pulmonary embolism 2016, hyperlipidemia Patient came to emergency room initially 05/14/2019 with fatigue and nausea in the setting of multiple family members reporting positive for Covid-he was positive also for coronavirus and came back to the emergency room 12/30 O2 sats were found to be low 80s he required 2 to 3 L of oxygen-steroids were started by ED Patient was started on remdesivir in addition   Data Reviewed:  BUN/creatinine 25/1.49-->25/0.99 [baseline less than 1.0] LFTs within normal limits Ferritin 720-->994  CRP 18.8-->17-->9.7 Lactic acid 2.0-->1.2, procalcitonin 0.31 LDH 395 troponin XI-->13 D-dimer 2.20-->2.03-->1.72 Assessment & Plan: Coronavirus 19 pneumonia Patient started on remdesivir 12/30 needs to complete on 1/3 Continue steroids until 1/8 Oxygen requirements have increased and patient is now needing about 7 L I have had discussion about Actemra risk-benefit scenario and if he worsens any further we will use the same with his consent He is on ceftriaxone which we will complete at least 5 days therapy I will repeat a chest x-ray as he has some dullness on exam Acute hypoxic respiratory failure Worse since he was diagnosed on 12/28-c see above discussion COPD Continue Combivent 1 puff every 6, Robitussin 10 mils every 4 as needed for cough Prior pulmonary embolism 2016 Monitor trends carefully given her prior history dimer is only 2.0 CT chest did not confirm PE 12/28 Bilateral knee replacements 2016 Stable at this time HLD No home meds monitor trends HTN Continue amlodipine 10 losartan 100 Mild obesity  Prophylactic Lovenox inpatient completing treatment Called to discuss with wife at [phone] and update   Subjective:  Awake  alert seems comfortable but now is on 5 L of oxygen He is only 88% and I bumped him up to 7 L when he went to 90 No fever no chills Eating drinking sound No chest pain  Consultants:   None Procedures:   None Antimicrobials:   Remdesivir 12/30 Objective: Vitals:   05/17/19 1830 05/17/19 1900 05/18/19 0034 05/18/19 0437  BP: 131/76 133/82 130/83 124/73  Pulse: 79 79 87 81  Resp: (!) 26 (!) 25 (!) 22 20  Temp:  99.9 F (37.7 C) 99.4 F (37.4 C) 99.2 F (37.3 C)  TempSrc:  Oral Oral Oral  SpO2: (!) 89% (!) 89% 90% (!) 89%  Weight:      Height:        Intake/Output Summary (Last 24 hours) at 05/18/2019 0809 Last data filed at 05/18/2019 0600 Gross per 24 hour  Intake 240 ml  Output 1500 ml  Net -1260 ml   Filed Weights   05/16/19 1845  Weight: 99.8 kg    Examination: Awake alert in nad no disrtress s1 s2 no m/r/g Slight diminished on L posterior lung fields s1 s2 no no m/r/g abd soft no le edema  Scheduled Meds: . amLODipine  10 mg Oral Daily  . vitamin C  500 mg Oral Daily  . dexamethasone (DECADRON) injection  6 mg Intravenous Q24H  . enoxaparin (LOVENOX) injection  40 mg Subcutaneous Daily  . Ipratropium-Albuterol  1 puff Inhalation Q6H  . losartan  100 mg Oral Daily  . zinc sulfate  220 mg Oral Daily   Continuous Infusions: . sodium chloride 75 mL/hr at 05/16/19 2309  . cefTRIAXone (ROCEPHIN)  IV 1 g (05/17/19  2213)  . remdesivir 100 mg in NS 100 mL Stopped (05/17/19 1253)     LOS: 2 days   Time spent: [place], MD [facility]

## 2019-05-19 LAB — CBC WITH DIFFERENTIAL/PLATELET
Abs Immature Granulocytes: 0.1 10*3/uL — ABNORMAL HIGH (ref 0.00–0.07)
Basophils Absolute: 0 10*3/uL (ref 0.0–0.1)
Basophils Relative: 0 %
Eosinophils Absolute: 0 10*3/uL (ref 0.0–0.5)
Eosinophils Relative: 0 %
HCT: 44.2 % (ref 39.0–52.0)
Hemoglobin: 14.8 g/dL (ref 13.0–17.0)
Immature Granulocytes: 1 %
Lymphocytes Relative: 5 %
Lymphs Abs: 0.4 10*3/uL — ABNORMAL LOW (ref 0.7–4.0)
MCH: 33 pg (ref 26.0–34.0)
MCHC: 33.5 g/dL (ref 30.0–36.0)
MCV: 98.7 fL (ref 80.0–100.0)
Monocytes Absolute: 0.2 10*3/uL (ref 0.1–1.0)
Monocytes Relative: 3 %
Neutro Abs: 6.4 10*3/uL (ref 1.7–7.7)
Neutrophils Relative %: 91 %
Platelets: 298 10*3/uL (ref 150–400)
RBC: 4.48 MIL/uL (ref 4.22–5.81)
RDW: 12.9 % (ref 11.5–15.5)
WBC: 7.1 10*3/uL (ref 4.0–10.5)
nRBC: 0 % (ref 0.0–0.2)

## 2019-05-19 LAB — COMPREHENSIVE METABOLIC PANEL
ALT: 33 U/L (ref 0–44)
AST: 35 U/L (ref 15–41)
Albumin: 2.5 g/dL — ABNORMAL LOW (ref 3.5–5.0)
Alkaline Phosphatase: 40 U/L (ref 38–126)
Anion gap: 11 (ref 5–15)
BUN: 19 mg/dL (ref 8–23)
CO2: 22 mmol/L (ref 22–32)
Calcium: 8.3 mg/dL — ABNORMAL LOW (ref 8.9–10.3)
Chloride: 106 mmol/L (ref 98–111)
Creatinine, Ser: 0.76 mg/dL (ref 0.61–1.24)
GFR calc Af Amer: >60 mL/min (ref >60)
GFR calc non Af Amer: >60 mL/min (ref >60)
Glucose, Bld: 123 mg/dL — ABNORMAL HIGH (ref 70–99)
Potassium: 4.2 mmol/L (ref 3.5–5.1)
Sodium: 139 mmol/L (ref 135–145)
Total Bilirubin: 0.4 mg/dL (ref 0.3–1.2)
Total Protein: 6.3 g/dL — ABNORMAL LOW (ref 6.5–8.1)

## 2019-05-19 LAB — FERRITIN: Ferritin: 700 ng/mL — ABNORMAL HIGH (ref 24–336)

## 2019-05-19 LAB — D-DIMER, QUANTITATIVE: D-Dimer, Quant: 1.54 ug/mL-FEU — ABNORMAL HIGH (ref 0.00–0.50)

## 2019-05-19 LAB — C-REACTIVE PROTEIN: CRP: 3.2 mg/dL — ABNORMAL HIGH (ref <1.0)

## 2019-05-19 NOTE — Evaluation (Signed)
Physical Therapy Evaluation Patient Details Name: Devin Castro MRN: [id number] DOB: 1951-03-11 Today's Date: 05/19/2019   History of Present Illness  6 white male known history of COPD not on oxygen, HTN, status post bilateral knee replacement 06/09/2014, pulmonary embolism 2016, and hyperlipidemia. Admitted with pneumonia due to covid.    Clinical Impression  Pt admitted with above diagnosis. PTA pt lived at home with his wife, independent and active. On eval, he required min assist bed mobility, min assist transfers and min assist ambulation 12' without AD. Pt on 9L O2. SpO2 91% at rest and desat to 85% during mobility. Max HR 122. MD present at end of session and decreased O2 to 4L with SpO2 90%. Pt currently with functional limitations due to the deficits listed below (see PT Problem List). Pt will benefit from skilled PT to increase their independence and safety with mobility to allow discharge to the venue listed below.       Follow Up Recommendations Home health PT;Supervision for mobility/OOB    Equipment Recommendations       Recommendations for Other Services       Precautions / Restrictions Precautions Precautions: Other (comment) Precaution Comments: watch vitals      Mobility  Bed Mobility Overal bed mobility: Needs Assistance Bed Mobility: Supine to Sit;Sit to Supine     Supine to sit: Min assist Sit to supine: Min assist   General bed mobility comments: +rail, increased time  Transfers Overall transfer level: Needs assistance Equipment used: None Transfers: Sit to/from Stand Sit to Stand: Min assist         General transfer comment: increased time to stabilize initial standing balance  Ambulation/Gait   Gait Distance (Feet): 12 Feet Assistive device: None Gait Pattern/deviations: Step-through pattern;Decreased stride length;Wide base of support Gait velocity: decreased   General Gait Details: Pt using sink and IV pole for occasional support.  Ambulated on 9L with desat to 85% and max HR 122.  Stairs            Wheelchair Mobility    Modified Rankin (Stroke Patients Only)       Balance Overall balance assessment: Needs assistance Sitting-balance support: No upper extremity supported;Feet supported Sitting balance-Leahy Scale: Good     Standing balance support: During functional activity;Single extremity supported;No upper extremity supported Standing balance-Leahy Scale: Fair Standing balance comment: static stand without UE support                             Pertinent Vitals/Pain Pain Assessment: No/denies pain    Home Living Family/patient expects to be discharged to:: Private residence Living Arrangements: Spouse/significant other Available Help at Discharge: Family;Available 24 hours/day Type of Home: House Home Access: Stairs to enter Entrance Stairs-Rails: Right Entrance Stairs-Number of Steps: 2 Home Layout: One level Home Equipment: Walker - 2 wheels;Shower seat      Prior Function Level of Independence: Independent               Hand Dominance   Dominant Hand: Right    Extremity/Trunk Assessment   Upper Extremity Assessment Upper Extremity Assessment: Defer to OT evaluation    Lower Extremity Assessment Lower Extremity Assessment: Generalized weakness    Cervical / Trunk Assessment Cervical / Trunk Assessment: Normal  Communication   Communication: No difficulties  Cognition Arousal/Alertness: Awake/alert Behavior During Therapy: WFL for tasks assessed/performed Overall Cognitive Status: Within Functional Limits for tasks assessed  General Comments General comments (skin integrity, edema, etc.): SpO2 91% at rest on 9L. Desat to 85% during amb. MD decreased O2 to 4L at end of session with SpO2 90% at rest in bed.    Exercises     Assessment/Plan    PT Assessment Patient needs continued PT services   PT Problem List Decreased strength;Decreased mobility;Decreased activity tolerance;Cardiopulmonary status limiting activity;Decreased balance       PT Treatment Interventions Therapeutic activities;Gait training;Therapeutic exercise;DME instruction;Patient/family education;Balance training;Stair training;Functional mobility training    PT Goals (Current goals can be found in the Care Plan section)  Acute Rehab PT Goals Patient Stated Goal: home PT Goal Formulation: With patient Time For Goal Achievement: 06/02/19 Potential to Achieve Goals: Good    Frequency Min 3X/week   Barriers to discharge        Co-evaluation               AM-PAC PT "6 Clicks" Mobility  Outcome Measure Help needed turning from your back to your side while in a flat bed without using bedrails?: None Help needed moving from lying on your back to sitting on the side of a flat bed without using bedrails?: A Little Help needed moving to and from a bed to a chair (including a wheelchair)?: A Little Help needed standing up from a chair using your arms (e.g., wheelchair or bedside chair)?: A Little Help needed to walk in hospital room?: A Little Help needed climbing 3-5 steps with a railing? : A Lot 6 Click Score: 18    End of Session Equipment Utilized During Treatment: Gait belt;Oxygen Activity Tolerance: Treatment limited secondary to medical complications (Comment)(increased O2 needs) Patient left: in bed;with call bell/phone within reach Nurse Communication: Mobility status PT Visit Diagnosis: Difficulty in walking, not elsewhere classified (R26.2);Muscle weakness (generalized) (M62.81)    Time: 3545-6256 PT Time Calculation (min) (ACUTE ONLY): 24 min   Charges:   PT Evaluation $PT Eval Moderate Complexity: 1 Mod PT Treatments $Gait Training: 8-22 mins        Aida Raider, PT  Office # [phone] Pager [phone]   Ilda Foil 05/19/2019, 12:09 PM

## 2019-05-19 NOTE — Progress Notes (Signed)
Patient Sat O2 88% on 4L oxygen via [place]. Increased to 5L, sat O2 90%. MD made aware. Will continue to monitor.

## 2019-05-19 NOTE — Progress Notes (Signed)
Hospitalist daily note   REX OESTERLE [id number] DOB: 1950-11-07 DOA: 05/16/2019  PCP: Eartha Inch, MD   Narrative:  54 white male known history of COPD not on oxygen HTN, status post bilateral knee replacement 06/09/2014, pulmonary embolism 2016, hyperlipidemia Patient came to emergency room initially 05/14/2019 with fatigue and nausea in the setting of multiple family members reporting positive for Covid-he was positive also for coronavirus and came back to the emergency room 12/30 O2 sats were found to be low 80s he required 2 to 3 L of oxygen-steroids were started by ED Patient was started on remdesivir in addition   Data Reviewed:  BUN/creatinine 25/1.49-->25/0.99-->19/0.76 [baseline less than 1.0] LFTs within normal limits Ferritin 720-->994--> 700 CRP 18.8-->17-->9.7-->3.2 Lactic acid 2.0-->1.2, procalcitonin 0.31 LDH 395 troponin XI-->13 D-dimer 2.20-->2.03-->1.72-->1.54  CXR = bilateral peripheral opacities unchanged  Assessment & Plan: Coronavirus 19 pneumonia Patient started on remdesivir 12/30 needs to complete on 1/3 Continue steroids until 1/8 Increasing oxygen requirements 1/1 through 1/3--started Actemra x1 He is on ceftriaxone which we will complete at least 5 days therapy Inflammatory markers are better Acute hypoxic respiratory failure Worse since he was diagnosed on 12/28-c see above discussion He is requiring about 9 liters of oxygen with movement but is able to transition to ~ 4 liters at rest If no improvement over the next 1-2 d will consider transfer to [facility] Will stop fluids in am COPD Continue Combivent 1 puff every 6, Robitussin 10 mils every 4 as needed for cough Prior pulmonary embolism 2016 Dimer is low would not treat at this time Bilateral knee replacements 2016 Stable at this time HLD No home meds monitor trends HTN Continue amlodipine 10 losartan 100 Mo control only Add meds if continues to be elevated in am Mild obesity  Prophylactic  Lovenox inpatient completing treatment Called to discuss with wife at [phone] on 1/1 and updated   Subjective:  [place] states some SOB No cp no fever no diarr Just worked with PT and needed 9 l oxygen   Consultants:   None Procedures:   None Antimicrobials:   Remdesivir 12/30 Objective: Vitals:   05/18/19 1300 05/18/19 2140 05/19/19 0700 05/19/19 0729  BP: 138/80 (!) 145/89 (!) 147/93   Pulse: 80  66   Resp: 18  16 18   Temp: 97.9 F (36.6 C) 97.7 F (36.5 C) 97.7 F (36.5 C) 98.1 F (36.7 C)  TempSrc: Axillary  Oral Oral  SpO2:  91% 92% 92%  Weight:      Height:        Intake/Output Summary (Last 24 hours) at 05/19/2019 0744 Last data filed at 05/19/2019 0700 Gross per 24 hour  Intake 409.21 ml  Output 1225 ml  Net -815.79 ml   Filed Weights   05/16/19 1845  Weight: 99.8 kg    Examination: eomi ncat no focal deficit abd soft cta b no adde dsound no rales No le edema  neuro intact  neck soft supple  Scheduled Meds: . amLODipine  10 mg Oral Daily  . vitamin C  500 mg Oral Daily  . dexamethasone (DECADRON) injection  6 mg Intravenous Q24H  . enoxaparin (LOVENOX) injection  40 mg Subcutaneous Daily  . Ipratropium-Albuterol  1 puff Inhalation Q6H  . losartan  100 mg Oral Daily  . zinc sulfate  220 mg Oral Daily   Continuous Infusions: . sodium chloride 75 mL/hr at 05/16/19 2309  . cefTRIAXone (ROCEPHIN)  IV 1 g (05/18/19 2153)  . remdesivir 100  mg in NS 100 mL 100 mg (05/18/19 0848)     LOS: 3 days   Time spent: [place], MD [facility]

## 2019-05-20 LAB — COMPREHENSIVE METABOLIC PANEL
ALT: 57 U/L — ABNORMAL HIGH (ref 0–44)
AST: 60 U/L — ABNORMAL HIGH (ref 15–41)
Albumin: 2.6 g/dL — ABNORMAL LOW (ref 3.5–5.0)
Alkaline Phosphatase: 39 U/L (ref 38–126)
Anion gap: 10 (ref 5–15)
BUN: 17 mg/dL (ref 8–23)
CO2: 22 mmol/L (ref 22–32)
Calcium: 8.4 mg/dL — ABNORMAL LOW (ref 8.9–10.3)
Chloride: 104 mmol/L (ref 98–111)
Creatinine, Ser: 0.74 mg/dL (ref 0.61–1.24)
GFR calc Af Amer: >60 mL/min (ref >60)
GFR calc non Af Amer: >60 mL/min (ref >60)
Glucose, Bld: 155 mg/dL — ABNORMAL HIGH (ref 70–99)
Potassium: 4.4 mmol/L (ref 3.5–5.1)
Sodium: 136 mmol/L (ref 135–145)
Total Bilirubin: 0.6 mg/dL (ref 0.3–1.2)
Total Protein: 6.2 g/dL — ABNORMAL LOW (ref 6.5–8.1)

## 2019-05-20 LAB — CBC WITH DIFFERENTIAL/PLATELET
Abs Immature Granulocytes: 0.11 10*3/uL — ABNORMAL HIGH (ref 0.00–0.07)
Basophils Absolute: 0 10*3/uL (ref 0.0–0.1)
Basophils Relative: 0 %
Eosinophils Absolute: 0 10*3/uL (ref 0.0–0.5)
Eosinophils Relative: 0 %
HCT: 45.8 % (ref 39.0–52.0)
Hemoglobin: 15.6 g/dL (ref 13.0–17.0)
Immature Granulocytes: 2 %
Lymphocytes Relative: 7 %
Lymphs Abs: 0.4 10*3/uL — ABNORMAL LOW (ref 0.7–4.0)
MCH: 33.1 pg (ref 26.0–34.0)
MCHC: 34.1 g/dL (ref 30.0–36.0)
MCV: 97.2 fL (ref 80.0–100.0)
Monocytes Absolute: 0.2 10*3/uL (ref 0.1–1.0)
Monocytes Relative: 3 %
Neutro Abs: 4.7 10*3/uL (ref 1.7–7.7)
Neutrophils Relative %: 88 %
Platelets: 303 10*3/uL (ref 150–400)
RBC: 4.71 MIL/uL (ref 4.22–5.81)
RDW: 12.5 % (ref 11.5–15.5)
WBC: 5.3 10*3/uL (ref 4.0–10.5)
nRBC: 0 % (ref 0.0–0.2)

## 2019-05-20 LAB — D-DIMER, QUANTITATIVE: D-Dimer, Quant: 1.83 ug/mL-FEU — ABNORMAL HIGH (ref 0.00–0.50)

## 2019-05-20 LAB — FERRITIN: Ferritin: 626 ng/mL — ABNORMAL HIGH (ref 24–336)

## 2019-05-20 LAB — C-REACTIVE PROTEIN: CRP: 1.4 mg/dL — ABNORMAL HIGH (ref <1.0)

## 2019-05-20 NOTE — Progress Notes (Signed)
Physical Therapy Treatment Patient Details Name: BRANNEN KOPPEN MRN: [id number] DOB: 1950/10/01 Today's Date: 05/20/2019    History of Present Illness 16 white male known history of COPD not on oxygen, HTN, status post bilateral knee replacement 06/09/2014, pulmonary embolism 2016, and hyperlipidemia. Admitted with pneumonia due to covid.    PT Comments    Pt moving better today. Min guard assist mobility. Amb 53' with RW. Pt initially on 4L O2 with desat to 80%. Increased O2 to 6L with improvement to 85%. SpO2 89% at rest on 4L. Pt fatigues quickly. Mildly tremulous during session. PT to continue per POC.  SATURATION QUALIFICATIONS: (This note is used to comply with regulatory documentation for home oxygen)  Patient Saturations on Room Air at Rest = N/A  Patient Saturations on Room Air while Ambulating = N/A  Patient Saturations on 6 Liters of oxygen while Ambulating = 85%  Please briefly explain why patient needs home oxygen:  Pt quickly desats during mobility. SpO2 85% on 6L with minimal gait distance.   Follow Up Recommendations  Home health PT;Supervision for mobility/OOB     Equipment Recommendations  None recommended by PT    Recommendations for Other Services       Precautions / Restrictions Precautions Precautions: Other (comment);Fall Precaution Comments: watch sats    Mobility  Bed Mobility Overal bed mobility: Needs Assistance Bed Mobility: Supine to Sit;Sit to Supine     Supine to sit: Min guard;HOB elevated Sit to supine: Min guard;HOB elevated   General bed mobility comments: +rail, min guard for safety  Transfers Overall transfer level: Needs assistance Equipment used: Ambulation equipment used Transfers: Sit to/from Stand Sit to Stand: Min guard         General transfer comment: min guard for safety  Ambulation/Gait Ambulation/Gait assistance: Min guard Gait Distance (Feet): 50 Feet Assistive device: Rolling walker (2 wheeled) Gait  Pattern/deviations: Step-through pattern;Decreased stride length Gait velocity: decreased Gait velocity interpretation: <1.31 ft/sec, indicative of household ambulator General Gait Details: RW used for increased stability. Desat to 85% on 6L O2.   Stairs             Wheelchair Mobility    Modified Rankin (Stroke Patients Only)       Balance Overall balance assessment: Needs assistance Sitting-balance support: No upper extremity supported;Feet supported Sitting balance-Leahy Scale: Good     Standing balance support: Bilateral upper extremity supported;No upper extremity supported;During functional activity   Standing balance comment: static stand without UE support                            Cognition Arousal/Alertness: Awake/alert Behavior During Therapy: WFL for tasks assessed/performed Overall Cognitive Status: Within Functional Limits for tasks assessed                                        Exercises      General Comments General comments (skin integrity, edema, etc.): Pt on 4L at rest SpO2 89%. Desat to 80% during amb on 4L. O2 increased to 6L with improvement to 85%.      Pertinent Vitals/Pain Pain Assessment: No/denies pain    Home Living                      Prior Function            PT  Goals (current goals can now be found in the care plan section) Acute Rehab PT Goals Patient Stated Goal: home Progress towards PT goals: Progressing toward goals    Frequency    Min 3X/week      PT Plan Current plan remains appropriate    Co-evaluation              AM-PAC PT "6 Clicks" Mobility   Outcome Measure  Help needed turning from your back to your side while in a flat bed without using bedrails?: None Help needed moving from lying on your back to sitting on the side of a flat bed without using bedrails?: A Little Help needed moving to and from a bed to a chair (including a wheelchair)?: A  Little Help needed standing up from a chair using your arms (e.g., wheelchair or bedside chair)?: A Little Help needed to walk in hospital room?: A Little Help needed climbing 3-5 steps with a railing? : A Lot 6 Click Score: 18    End of Session Equipment Utilized During Treatment: Gait belt;Oxygen Activity Tolerance: Patient tolerated treatment well Patient left: in bed;with call bell/phone within reach Nurse Communication: Mobility status PT Visit Diagnosis: Difficulty in walking, not elsewhere classified (R26.2);Muscle weakness (generalized) (M62.81)     Time: 0092-3300 PT Time Calculation (min) (ACUTE ONLY): 23 min  Charges:  $Gait Training: 23-37 mins                     Lorrin Goodell, [place]  Office # [phone] Pager [phone]    Lorriane Shire 05/20/2019, 12:41 PM

## 2019-05-20 NOTE — Progress Notes (Signed)
`Hospitalist daily note   Devin Castro [id number] DOB: November 08, 1950 DOA: 05/16/2019  PCP: Eartha Inch, MD   Narrative:  73 white male known history of COPD not on oxygen HTN, status post bilateral knee replacement 06/09/2014, pulmonary embolism 2016, hyperlipidemia Patient came to emergency room initially 05/14/2019 with fatigue and nausea in the setting of multiple family members reporting positive for Covid-he was positive also for coronavirus and came back to the emergency room 12/30 O2 sats were found to be low 80s he required 2 to 3 L of oxygen-steroids were started by ED Patient was started on remdesivir in addition   Data Reviewed:  BUN/creatinine 25/1.49-17/0.74 [baseline less than 1.0] AST slightly up 60 ALT 57 WBC 5.3 Ferritin 720-->994--> 700-->626 CRP 18.8-->17-->9.7-->3.2-->1.4 Lactic acid 2.0-->1.2, procalcitonin 0.31 LDH 395 troponin XI-->13 D-dimer 2.20-->2.03-->1.72-->1.54-->1.8 CXR = bilateral peripheral opacities unchanged  Assessment & Plan: Coronavirus 19 pneumonia Patient started on remdesivir 12/30 needs to complete on 1/3 Continue steroids until 1/8 Increasing oxygen requirements 1/1 through 1/3--started Actemra x1 Ambulate today around unit anfd see how he does--if able to take minmal oxygen and doijng well, likely d/c home am Acute hypoxic respiratory failure Worse since he was diagnosed on 12/28-c see above discussion Was requiring 9 L now 5 L--has improvewd ot some degree-see above discussion COPD Continue Combivent 1 puff every 6, Robitussin 10 mils every 4 as needed for cough Prior pulmonary embolism 2016 Dimer is low would not treat at this time Bilateral knee replacements 2016 Stable at this time HLD No home meds monitor trends HTN Continue amlodipine 10 losartan 100 Mo control only Add meds if continues to be elevated in am Mild obesity  Prophylactic Lovenox inpatient completing treatment Called to discuss with wife at [phone] on 1/1  and updated   Subjective:  Better-was able to get up and move around last pm--currently on 4 liter soxygen Doesn't seem like he needed much oxygen to move around No cp fever chills rigors no other issue  Consultants:   None Procedures:   None Antimicrobials:   Remdesivir 12/30 Objective: Vitals:   05/19/19 2100 05/20/19 0010 05/20/19 0443 05/20/19 0456  BP:  138/89 138/85   Pulse:  69 63   Resp:  20 20   Temp:  98.1 F (36.7 C) 98.2 F (36.8 C)   TempSrc:  Oral Oral   SpO2: 93% 93% (!) 89% 91%  Weight:      Height:        Intake/Output Summary (Last 24 hours) at 05/20/2019 0750 Last data filed at 05/20/2019 1478 Gross per 24 hour  Intake 600 ml  Output 1450 ml  Net -850 ml   Filed Weights   05/16/19 1845  Weight: 99.8 kg    Examination:  Awake coherent in nad no focal deficit cta b no added sound no rales no rhonchi S1 s2 no m abd soft nt nd no rebound no le edema  Scheduled Meds: . amLODipine  10 mg Oral Daily  . vitamin C  500 mg Oral Daily  . dexamethasone (DECADRON) injection  6 mg Intravenous Q24H  . enoxaparin (LOVENOX) injection  40 mg Subcutaneous Daily  . Ipratropium-Albuterol  1 puff Inhalation Q6H  . losartan  100 mg Oral Daily  . zinc sulfate  220 mg Oral Daily   Continuous Infusions: . cefTRIAXone (ROCEPHIN)  IV 1 g (05/19/19 2117)  . remdesivir 100 mg in NS 100 mL 100 mg (05/19/19 0916)     LOS: 4 days  Time spent: [place], MD [facility]

## 2019-05-20 NOTE — Progress Notes (Signed)
SATURATION QUALIFICATIONS: (This note is used to comply with regulatory documentation for home oxygen)  Patient Saturations on Room Air at Rest = 86%  Patient Saturations on Room Air while Ambulating = 77%  Patient Saturations on 6 Liters of oxygen while Ambulating = 87%  Please briefly explain why patient needs home oxygen: on 6L oxygen via [place] patient's Sat O2 dropped while ambulating and maintain Sat O2 88% for a while at rest on 4 L oxygen.

## 2019-05-21 ENCOUNTER — Inpatient Hospital Stay (HOSPITAL_COMMUNITY): Payer: PPO

## 2019-05-21 DIAGNOSIS — R9431 Abnormal electrocardiogram [ECG] [EKG]: Secondary | ICD-10-CM

## 2019-05-21 LAB — CBC WITH DIFFERENTIAL/PLATELET
Abs Immature Granulocytes: 0.3 10*3/uL — ABNORMAL HIGH (ref 0.00–0.07)
Basophils Absolute: 0 10*3/uL (ref 0.0–0.1)
Basophils Relative: 1 %
Eosinophils Absolute: 0 10*3/uL (ref 0.0–0.5)
Eosinophils Relative: 0 %
HCT: 46.8 % (ref 39.0–52.0)
Hemoglobin: 16.2 g/dL (ref 13.0–17.0)
Immature Granulocytes: 5 %
Lymphocytes Relative: 8 %
Lymphs Abs: 0.5 10*3/uL — ABNORMAL LOW (ref 0.7–4.0)
MCH: 33.3 pg (ref 26.0–34.0)
MCHC: 34.6 g/dL (ref 30.0–36.0)
MCV: 96.3 fL (ref 80.0–100.0)
Monocytes Absolute: 0.3 10*3/uL (ref 0.1–1.0)
Monocytes Relative: 4 %
Neutro Abs: 4.8 10*3/uL (ref 1.7–7.7)
Neutrophils Relative %: 82 %
Platelets: 343 10*3/uL (ref 150–400)
RBC: 4.86 MIL/uL (ref 4.22–5.81)
RDW: 12.6 % (ref 11.5–15.5)
WBC: 5.9 10*3/uL (ref 4.0–10.5)
nRBC: 0 % (ref 0.0–0.2)

## 2019-05-21 LAB — COMPREHENSIVE METABOLIC PANEL
ALT: 75 U/L — ABNORMAL HIGH (ref 0–44)
AST: 63 U/L — ABNORMAL HIGH (ref 15–41)
Albumin: 2.7 g/dL — ABNORMAL LOW (ref 3.5–5.0)
Alkaline Phosphatase: 37 U/L — ABNORMAL LOW (ref 38–126)
Anion gap: 13 (ref 5–15)
BUN: 16 mg/dL (ref 8–23)
CO2: 25 mmol/L (ref 22–32)
Calcium: 8.5 mg/dL — ABNORMAL LOW (ref 8.9–10.3)
Chloride: 101 mmol/L (ref 98–111)
Creatinine, Ser: 0.77 mg/dL (ref 0.61–1.24)
GFR calc Af Amer: >60 mL/min (ref >60)
GFR calc non Af Amer: >60 mL/min (ref >60)
Glucose, Bld: 155 mg/dL — ABNORMAL HIGH (ref 70–99)
Potassium: 3.9 mmol/L (ref 3.5–5.1)
Sodium: 139 mmol/L (ref 135–145)
Total Bilirubin: 0.7 mg/dL (ref 0.3–1.2)
Total Protein: 6.5 g/dL (ref 6.5–8.1)

## 2019-05-21 LAB — CULTURE, BLOOD (ROUTINE X 2)
Culture: NO GROWTH
Culture: NO GROWTH
Special Requests: ADEQUATE

## 2019-05-21 LAB — ECHOCARDIOGRAM LIMITED
Height: 70 in
Weight: 3520 oz

## 2019-05-21 LAB — D-DIMER, QUANTITATIVE: D-Dimer, Quant: 2.06 ug/mL-FEU — ABNORMAL HIGH (ref 0.00–0.50)

## 2019-05-21 LAB — FERRITIN: Ferritin: 751 ng/mL — ABNORMAL HIGH (ref 24–336)

## 2019-05-21 LAB — C-REACTIVE PROTEIN: CRP: 0.8 mg/dL (ref <1.0)

## 2019-05-21 NOTE — TOC Progression Note (Signed)
Transition of Care [facility]) - Progression Note    Patient Details  Name: Devin Castro MRN: [id number] Date of Birth: 07/01/1950  Transition of Care [facility] CM/SW Contact  Cherylann Parr, RN Phone Number: 05/21/2019, 10:49 AM  Clinical Narrative:   PTA independent from home.  Pt informed CM that he has a PCP and denied barriers with paying for medications.  Pt is in agreement with both HH  (as recommended)and home oxygen as ordered.  CM provided medicare.gov HH list choice - pt chose [facility] - agency accepts referral.  Pt chose [facility] for home oxygen order.  Both agencies made aware that pt is COVID positive.      Expected Discharge Plan: Home w Home Health Services    Expected Discharge Plan and Services Expected Discharge Plan: Home w Home Health Services     Post Acute Care Choice: Home Health Living arrangements for the past 2 months: Single Family Home                 DME Arranged: Oxygen DME Agency: [facility] Date DME Agency Contacted: 05/21/19 Time DME Agency Contacted: 1049 Representative spoke with at DME Agency: Zack HH Arranged: PT HH Agency: [facility] Date HH Agency Contacted: 05/21/19 Time HH Agency Contacted: 1002 Representative spoke with at [facility] Agency: Lupita Leash   Social Determinants of Health (SDOH) Interventions    Readmission Risk Interventions No flowsheet data found.

## 2019-05-21 NOTE — Progress Notes (Signed)
PROGRESS NOTE    Devin Castro  UMP:[id number] DOB: 1950-06-02 DOA: 05/16/2019 PCP: Chesley Noon, MD   Brief Narrative: 69 y.o. male with medical history significant of COPD, pulmonary embolism on chronic anticoagulation, osteoarthritis, vertigo and recent diagnosis of COVID-19 pneumonia which was done last Monday.  Symptoms actually started Christmas eve.  He has been having upper respiratory infection.  He was having shortness of breath and cough.  Initially thought it was his COPD flaring up.  Patient went to the knee pain ER 2 days ago where he was tested and found to have COVID-19 positive.  He was offered admission at that time but oxygen was staying off.  Patient went home but came back today due to worsening shortness of breath and hypoxia.  He is now on 3 L of oxygen here.  Has been having cough.  Also fever and chills.  Patient being admitted therefore with COVID-19 pneumonia and new oxygen requirement..  ED Course: Temperature is 101.1, blood pressure 140/88 pulse 116 respirate 27 oxygen sat 85% on room air.  CBC and chemistry looks entirely within normal except for calcium 8.3.  LDH 395 troponin of 13 lactic acid 1.2.  Chest x-ray showed findings consistent with bilateral pneumonia.  Other markers for COVID-19 are currently pending.  Patient is requiring 3 L so is being admitted for treatment.  Assessment & Plan:   Principal Problem:   Pneumonia due to COVID-19 virus Active Problems:   Pulmonary embolism (HCC)   Hypertension   COPD (chronic obstructive pulmonary disease) (HCC)   #1 acute hypoxic respiratory failure secondary to Covid pneumonia-patient is still very hypoxic.  On 6 L his saturations is 87% with ambulation.  Currently he is 92% on 4 L high flow nasal cannula at rest.  Status post IV remdesivir finished 5-day course 05/20/2019. On steroids to complete a 10-day course last dose will be 05/25/2019. He got Actemra 1 dose On Rocephin for possible community-acquired  pneumonia Continue Combivent inhalers  COVID-19 Labs  Recent Labs    05/19/19 0500 05/20/19 0322 05/21/19 0245  DDIMER 1.54* 1.83* 2.06*  FERRITIN 700* 626* 751*  CRP 3.2* 1.4* 0.8    Lab Results  Component Value Date   SARSCOV2NAA POSITIVE (A) 05/14/2019   #2 hyperlipidemia not on any medications at home  #3 hypertension on amlodipine and losartan  #4 increasing D-dimer from 1.54 to 1.83 to  2.06 today.  Patient with prior history of PE in 2016-check echo and lower extremity Doppler.  CT CHEST-05/14/2019 no evidence of pulmonary embolism. 2Extensive patchy ground-glass opacity with areas of crazy paving throughout both lungs, nonspecific with a broad differential, but high suspicion for COVID-19 pneumonia. Follow-up chest radiographs recommended to document resolution.  Nonspecific mild subcarinal and right hilar lymphadenopathy. Two vessel coronary atherosclerosis.. No acute abnormality in the abdomen or pelvis. Marked diffuse colonic diverticulosis, with no evidence of acute diverticulitis. No evidence of bowel obstruction or acute bowel inflammation.  Nonobstructing left nephrolithiasis.  No hydronephrosis. . Small hiatal hernia.  #5 history of bilateral knee replacements stable     Estimated body mass index is 31.57 kg/m as calculated from the following:   Height as of this encounter: 5\' 10"  (1.778 m).   Weight as of this encounter: 99.8 kg.  DVT prophylaxis Lovenox Code Status: Full code  family Communication: Discussed with patient Disposition Plan: Pending clinical improvement  Consultants:   None  Procedures: None Antimicrobials:rocephin Subjective:  He is resting in bed he feels okay but  with exertion his saturation drops he denies any chest pain he has occasional cough Objective: Vitals:   05/20/19 1400 05/20/19 2213 05/21/19 0518 05/21/19 0849  BP: (!) 144/86   (!) 143/94  Pulse: 79   80  Resp: 20     Temp: 97.9 F (36.6 C) 97.8 F (36.6  C) 98.3 F (36.8 C)   TempSrc: Oral Oral Axillary   SpO2: 93% 91% 92%   Weight:      Height:        Intake/Output Summary (Last 24 hours) at 05/21/2019 1152 Last data filed at 05/21/2019 0853 Gross per 24 hour  Intake 610 ml  Output 2250 ml  Net -1640 ml   Filed Weights   05/16/19 1845  Weight: 99.8 kg    Examination:  General exam: Appears calm and comfortable  Respiratory system: Scattered rhonchi in both lung fields to auscultation. Respiratory effort normal. Cardiovascular system: S1 & S2 heard, RRR. No JVD, murmurs, rubs, gallops or clicks. No pedal edema. Gastrointestinal system: Abdomen is nondistended, soft and nontender. No organomegaly or masses felt. Normal bowel sounds heard. Central nervous system: Alert and oriented. No focal neurological deficits. Extremities: Symmetric 5 x 5 power. Skin: No rashes, lesions or ulcers Psychiatry: Judgement and insight appear normal. Mood & affect appropriate.     Data Reviewed: I have personally reviewed following labs and imaging studies  CBC: Recent Labs  Lab 05/17/19 0247 05/18/19 0310 05/19/19 0500 05/20/19 0322 05/21/19 0245  WBC 5.0 9.3 7.1 5.3 5.9  NEUTROABS 4.3 8.3* 6.4 4.7 4.8  HGB 15.0 15.5 14.8 15.6 16.2  HCT 44.1 46.0 44.2 45.8 46.8  MCV 98.7 98.7 98.7 97.2 96.3  PLT 188 265 298 303 343   Basic Metabolic Panel: Recent Labs  Lab 05/17/19 0247 05/18/19 0310 05/19/19 0500 05/20/19 0322 05/21/19 0245  NA 137 141 139 136 139  K 3.9 3.8 4.2 4.4 3.9  CL 103 106 106 104 101  CO2 22 26 22 22 25   GLUCOSE 154* 139* 123* 155* 155*  BUN 25* 25* 19 17 16   CREATININE 1.49* 0.99 0.76 0.74 0.77  CALCIUM 8.1* 8.5* 8.3* 8.4* 8.5*   GFR: Estimated Creatinine Clearance: 107.5 mL/min (by C-G formula based on SCr of 0.77 mg/dL). Liver Function Tests: Recent Labs  Lab 05/17/19 0247 05/18/19 0310 05/19/19 0500 05/20/19 0322 05/21/19 0245  AST 40 31 35 60* 63*  ALT 35 32 33 57* 75*  ALKPHOS 42 42 40 39 37*    BILITOT 0.7 0.5 0.4 0.6 0.7  PROT 6.4* 6.5 6.3* 6.2* 6.5  ALBUMIN 2.5* 2.5* 2.5* 2.6* 2.7*   No results for input(s): LIPASE, AMYLASE in the last 168 hours. No results for input(s): AMMONIA in the last 168 hours. Coagulation Profile: No results for input(s): INR, PROTIME in the last 168 hours. Cardiac Enzymes: No results for input(s): CKTOTAL, CKMB, CKMBINDEX, TROPONINI in the last 168 hours. BNP (last 3 results) No results for input(s): PROBNP in the last 8760 hours. HbA1C: No results for input(s): HGBA1C in the last 72 hours. CBG: Recent Labs  Lab 05/16/19 2144  GLUCAP 128*   Lipid Profile: No results for input(s): CHOL, HDL, LDLCALC, TRIG, CHOLHDL, LDLDIRECT in the last 72 hours. Thyroid Function Tests: No results for input(s): TSH, T4TOTAL, FREET4, T3FREE, THYROIDAB in the last 72 hours. Anemia Panel: Recent Labs    05/20/19 0322 05/21/19 0245  FERRITIN 626* 751*   Sepsis Labs: Recent Labs  Lab 05/16/19 1912 05/16/19 1944 05/16/19 2112  PROCALCITON 0.31  --   --   LATICACIDVEN  --  2.0* 1.2    Recent Results (from the past 240 hour(s))  Respiratory Panel by RT PCR (Flu A&B, Covid) - Nasopharyngeal Swab     Status: Abnormal   Collection Time: 05/14/19 12:25 PM   Specimen: Nasopharyngeal Swab  Result Value Ref Range Status   SARS Coronavirus 2 by RT PCR POSITIVE (A) NEGATIVE Final    Comment: RESULT CALLED TO, READ BACK BY AND VERIFIED WITH: WHITE,M@[id number]  BY MATTHEWS, B 12.28.2020 (NOTE) SARS-CoV-2 target nucleic acids are DETECTED. SARS-CoV-2 RNA is generally detectable in upper respiratory specimens  during the acute phase of infection. Positive results are indicative of the presence of the identified virus, but do not rule out bacterial infection or co-infection with other pathogens not detected by the test. Clinical correlation with patient history and other diagnostic information is necessary to determine patient infection status. The expected result  is Negative. Fact Sheet for Patients:  https://www.moore.com/ Fact Sheet for Healthcare Providers: https://www.young.biz/ This test is not yet approved or cleared by the [place] FDA and  has been authorized for detection and/or diagnosis of SARS-CoV-2 by FDA under an Emergency Use Authorization (EUA).  This EUA will remain in effect (meaning this test can be used ) for the duration of  the COVID-19 declaration under Section 564(b)(1) of the Act, 21 U.S.C. section 360bbb-3(b)(1), unless the authorization is terminated or revoked sooner.    Influenza A by PCR NEGATIVE NEGATIVE Final   Influenza B by PCR NEGATIVE NEGATIVE Final    Comment: (NOTE) The Xpert Xpress SARS-CoV-2/FLU/RSV assay is intended as an aid in  the diagnosis of influenza from Nasopharyngeal swab specimens and  should not be used as a sole basis for treatment. Nasal washings and  aspirates are unacceptable for Xpert Xpress SARS-CoV-2/FLU/RSV  testing. Fact Sheet for Patients: https://www.moore.com/ Fact Sheet for Healthcare Providers: https://www.young.biz/ This test is not yet approved or cleared by the [place] FDA and  has been authorized for detection and/or diagnosis of SARS-CoV-2 by  FDA under an Emergency Use Authorization (EUA). This EUA will remain  in effect (meaning this test can be used) for the duration of the  Covid-19 declaration under Section 564(b)(1) of the Act, 21  U.S.C. section 360bbb-3(b)(1), unless the authorization is  terminated or revoked. Performed at [facility], [address], [place], [place] [address]   Blood Culture (routine x 2)     Status: None (Preliminary result)   Collection Time: 05/16/19  6:50 PM   Specimen: BLOOD  Result Value Ref Range Status   Specimen Description BLOOD LEFT ANTECUBITAL  Final   Special Requests   Final    BOTTLES DRAWN AEROBIC AND ANAEROBIC Blood Culture results may  not be optimal due to an inadequate volume of blood received in culture bottles   Culture   Final    NO GROWTH 4 DAYS Performed at [facility], [address] [place], [place] [address]    Report Status PENDING  Incomplete  Blood Culture (routine x 2)     Status: None (Preliminary result)   Collection Time: 05/16/19  8:14 PM   Specimen: BLOOD  Result Value Ref Range Status   Specimen Description BLOOD LEFT ANTECUBITAL  Final   Special Requests   Final    BOTTLES DRAWN AEROBIC ONLY Blood Culture adequate volume   Culture   Final    NO GROWTH 4 DAYS Performed at [facility],  [address], [place], [place] [address]    Report Status PENDING  Incomplete         Radiology Studies: No results found.      Scheduled Meds: . amLODipine  10 mg Oral Daily  . vitamin C  500 mg Oral Daily  . dexamethasone (DECADRON) injection  6 mg Intravenous Q24H  . enoxaparin (LOVENOX) injection  40 mg Subcutaneous Daily  . Ipratropium-Albuterol  1 puff Inhalation Q6H  . losartan  100 mg Oral Daily  . zinc sulfate  220 mg Oral Daily   Continuous Infusions: . cefTRIAXone (ROCEPHIN)  IV 1 g (05/20/19 2258)     LOS: 5 days     Alwyn Ren, MD [facility]  If 7PM-7AM, please contact night-coverage [email or web address] Password [email or web address] 05/21/2019, 11:52 AM

## 2019-05-21 NOTE — Progress Notes (Signed)
  Echocardiogram 2D Echocardiogram has been performed.  Dorena Dew Kaitlan Bin 05/21/2019, 4:21 PM

## 2019-05-22 ENCOUNTER — Inpatient Hospital Stay (HOSPITAL_COMMUNITY): Payer: PPO

## 2019-05-22 DIAGNOSIS — R791 Abnormal coagulation profile: Secondary | ICD-10-CM

## 2019-05-22 NOTE — Progress Notes (Signed)
PROGRESS NOTE    Devin Castro  ZOX:[id number] DOB: Oct 13, 1950 DOA: 05/16/2019 PCP: Eartha InchBadger, Michael C, MD    Brief Narrative: 69 y.o.malewith medical history significant ofCOPD, pulmonary embolism on chronic anticoagulation, osteoarthritis, vertigo and recent diagnosis of COVID-19 pneumonia which was done last Monday. Symptoms actually started Christmas eve. He has been having upper respiratory infection. He was having shortness of breath and cough. Initially thought it was his COPD flaring up. Patient went to the knee pain ER 2 days ago where he was tested and found to have COVID-19 positive. He was offered admission at that time but oxygen was staying off. Patient went home but came back today due to worsening shortness of breath and hypoxia. He is now on 3 L of oxygen here. Has been having cough. Also fever and chills. Patient being admitted therefore with COVID-19 pneumonia and new oxygen requirement..  ED Course:Temperature is 101.1, blood pressure 140/88 pulse 116 respirate 27 oxygen sat 85% on room air. CBC and chemistry looks entirely within normal except for calcium 8.3. LDH 395 troponin of 13 lactic acid 1.2. Chest x-ray showed findings consistent with bilateral pneumonia. Other markers for COVID-19 are currently pending. Patient is requiring 3 L so is being admitted for treatment.  Assessment & Plan:   Principal Problem:   Pneumonia due to COVID-19 virus Active Problems:   Pulmonary embolism (HCC)   Hypertension   COPD (chronic obstructive pulmonary disease) (HCC)  #1 acute hypoxic respiratory failure secondary to Covid pneumonia patient continues to be still hypoxic today his saturation dropped to 87% on 4 L while ambulating.  I will repeat a chest x-ray today.  Echocardiogram done pending results.-pending.  COVID-19 Labs  Recent Labs    05/20/19 0322 05/21/19 0245  DDIMER 1.83* 2.06*  FERRITIN 626* 751*  CRP 1.4* 0.8    Lab Results  Component Value  Date   SARSCOV2NAA POSITIVE (A) 05/14/2019    Status post IV remdesivir finished 5-day course 05/20/2019. On steroids to complete a 10-day course last dose will be 05/25/2019. He got Actemra 1 dose On Rocephin for possible community-acquired pneumonia Continue Combivent inhalers  #2 hyperlipidemia not on any medications at home  #3  Essential hypertension blood pressure 133/85 continue amlodipine and losartan.   #4 increasing D-dimer from 1.54 to 1.83 to  2.06 today.    Patient with prior history of PE in 2016- Lower extremity Doppler done results pending echo done results pending.   CT CHEST-05/14/2019 no evidence of pulmonary embolism. Extensive patchy ground-glass opacity with areas of crazy paving throughout both lungs, nonspecific with a broad differential, but high suspicion for COVID-19 pneumonia. Follow-up chest radiographs recommended to document resolution.  Nonspecific mild subcarinal and right hilar lymphadenopathy. Two vessel coronary atherosclerosis.. No acute abnormality in the abdomen or pelvis. Marked diffuse colonic diverticulosis, with no evidence of acute diverticulitis. No evidence of bowel obstruction or acute bowel inflammation.  Nonobstructing left nephrolithiasis. No hydronephrosis. . Small hiatal hernia.  #5 history of bilateral knee replacements stable   Estimated body mass index is 31.57 kg/m as calculated from the following:   Height as of this encounter: 5\' 10"  (1.778 m).   Weight as of this encounter: 99.8 kg. DVT prophylaxis Lovenox Code Status: Full code  family Communication: Discussed with patient Disposition Plan: Pending clinical improvement  Consultants:   None  Procedures: None Antimicrobials:rocephin  Subjective: Patient resting in bed continues to have some cough and shortness of breath hypoxic on ambulation  Objective: Vitals:  05/22/19 0827 05/22/19 0830 05/22/19 1000 05/22/19 1204  BP:  133/85    Pulse: (!) 122  87 92   Resp:      Temp:      TempSrc:      SpO2: (!) 87% 92% 93% 93%  Weight:      Height:        Intake/Output Summary (Last 24 hours) at 05/22/2019 1243 Last data filed at 05/22/2019 1200 Gross per 24 hour  Intake --  Output 875 ml  Net -875 ml   Filed Weights   05/16/19 1845  Weight: 99.8 kg    Examination:  General exam: Appears calm and comfortable  Respiratory system coarse breath sounds bilaterally to auscultation. Respiratory effort normal. Cardiovascular system: S1 & S2 heard, RRR. No JVD, murmurs, rubs, gallops or clicks. No pedal edema. Gastrointestinal system: Abdomen is nondistended, soft and nontender. No organomegaly or masses felt. Normal bowel sounds heard. Central nervous system: Alert and oriented. No focal neurological deficits. Extremities: No edema  skin: No rashes, lesions or ulcers Psychiatry: Judgement and insight appear normal. Mood & affect appropriate.     Data Reviewed: I have personally reviewed following labs and imaging studies  CBC: Recent Labs  Lab 05/17/19 0247 05/18/19 0310 05/19/19 0500 05/20/19 0322 05/21/19 0245  WBC 5.0 9.3 7.1 5.3 5.9  NEUTROABS 4.3 8.3* 6.4 4.7 4.8  HGB 15.0 15.5 14.8 15.6 16.2  HCT 44.1 46.0 44.2 45.8 46.8  MCV 98.7 98.7 98.7 97.2 96.3  PLT 188 265 298 303 811   Basic Metabolic Panel: Recent Labs  Lab 05/17/19 0247 05/18/19 0310 05/19/19 0500 05/20/19 0322 05/21/19 0245  NA 137 141 139 136 139  K 3.9 3.8 4.2 4.4 3.9  CL 103 106 106 104 101  CO2 22 26 22 22 25   GLUCOSE 154* 139* 123* 155* 155*  BUN 25* 25* 19 17 16   CREATININE 1.49* 0.99 0.76 0.74 0.77  CALCIUM 8.1* 8.5* 8.3* 8.4* 8.5*   GFR: Estimated Creatinine Clearance: 107.5 mL/min (by C-G formula based on SCr of 0.77 mg/dL). Liver Function Tests: Recent Labs  Lab 05/17/19 0247 05/18/19 0310 05/19/19 0500 05/20/19 0322 05/21/19 0245  AST 40 31 35 60* 63*  ALT 35 32 33 57* 75*  ALKPHOS 42 42 40 39 37*  BILITOT 0.7 0.5 0.4 0.6  0.7  PROT 6.4* 6.5 6.3* 6.2* 6.5  ALBUMIN 2.5* 2.5* 2.5* 2.6* 2.7*   No results for input(s): LIPASE, AMYLASE in the last 168 hours. No results for input(s): AMMONIA in the last 168 hours. Coagulation Profile: No results for input(s): INR, PROTIME in the last 168 hours. Cardiac Enzymes: No results for input(s): CKTOTAL, CKMB, CKMBINDEX, TROPONINI in the last 168 hours. BNP (last 3 results) No results for input(s): PROBNP in the last 8760 hours. HbA1C: No results for input(s): HGBA1C in the last 72 hours. CBG: Recent Labs  Lab 05/16/19 2144  GLUCAP 128*   Lipid Profile: No results for input(s): CHOL, HDL, LDLCALC, TRIG, CHOLHDL, LDLDIRECT in the last 72 hours. Thyroid Function Tests: No results for input(s): TSH, T4TOTAL, FREET4, T3FREE, THYROIDAB in the last 72 hours. Anemia Panel: Recent Labs    05/20/19 0322 05/21/19 0245  FERRITIN 626* 751*   Sepsis Labs: Recent Labs  Lab 05/16/19 1912 05/16/19 1944 05/16/19 2112  PROCALCITON 0.31  --   --   LATICACIDVEN  --  2.0* 1.2    Recent Results (from the past 240 hour(s))  Respiratory Panel by RT PCR (Flu A&B,  Covid) - Nasopharyngeal Swab     Status: Abnormal   Collection Time: 05/14/19 12:25 PM   Specimen: Nasopharyngeal Swab  Result Value Ref Range Status   SARS Coronavirus 2 by RT PCR POSITIVE (A) NEGATIVE Final    Comment: RESULT CALLED TO, READ BACK BY AND VERIFIED WITH: WHITE,M@[id number]  BY MATTHEWS, B 12.28.2020 (NOTE) SARS-CoV-2 target nucleic acids are DETECTED. SARS-CoV-2 RNA is generally detectable in upper respiratory specimens  during the acute phase of infection. Positive results are indicative of the presence of the identified virus, but do not rule out bacterial infection or co-infection with other pathogens not detected by the test. Clinical correlation with patient history and other diagnostic information is necessary to determine patient infection status. The expected result is Negative. Fact Sheet  for Patients:  https://www.moore.com/ Fact Sheet for Healthcare Providers: https://www.young.biz/ This test is not yet approved or cleared by the [place] FDA and  has been authorized for detection and/or diagnosis of SARS-CoV-2 by FDA under an Emergency Use Authorization (EUA).  This EUA will remain in effect (meaning this test can be used ) for the duration of  the COVID-19 declaration under Section 564(b)(1) of the Act, 21 U.S.C. section 360bbb-3(b)(1), unless the authorization is terminated or revoked sooner.    Influenza A by PCR NEGATIVE NEGATIVE Final   Influenza B by PCR NEGATIVE NEGATIVE Final    Comment: (NOTE) The Xpert Xpress SARS-CoV-2/FLU/RSV assay is intended as an aid in  the diagnosis of influenza from Nasopharyngeal swab specimens and  should not be used as a sole basis for treatment. Nasal washings and  aspirates are unacceptable for Xpert Xpress SARS-CoV-2/FLU/RSV  testing. Fact Sheet for Patients: https://www.moore.com/ Fact Sheet for Healthcare Providers: https://www.young.biz/ This test is not yet approved or cleared by the [place] FDA and  has been authorized for detection and/or diagnosis of SARS-CoV-2 by  FDA under an Emergency Use Authorization (EUA). This EUA will remain  in effect (meaning this test can be used) for the duration of the  Covid-19 declaration under Section 564(b)(1) of the Act, 21  U.S.C. section 360bbb-3(b)(1), unless the authorization is  terminated or revoked. Performed at [facility], [address] [place], [place] [address]   Blood Culture (routine x 2)     Status: None   Collection Time: 05/16/19  6:50 PM   Specimen: BLOOD  Result Value Ref Range Status   Specimen Description BLOOD LEFT ANTECUBITAL  Final   Special Requests   Final    BOTTLES DRAWN AEROBIC AND ANAEROBIC Blood Culture results may not be optimal due to an inadequate volume of  blood received in culture bottles   Culture   Final    NO GROWTH 5 DAYS Performed at [facility], [address] [place], [place] [address]    Report Status 05/21/2019 FINAL  Final  Blood Culture (routine x 2)     Status: None   Collection Time: 05/16/19  8:14 PM   Specimen: BLOOD  Result Value Ref Range Status   Specimen Description BLOOD LEFT ANTECUBITAL  Final   Special Requests   Final    BOTTLES DRAWN AEROBIC ONLY Blood Culture adequate volume   Culture   Final    NO GROWTH 5 DAYS Performed at [facility], [address], [place], [place] [address]    Report Status 05/21/2019 FINAL  Final         Radiology Studies: ECHOCARDIOGRAM LIMITED  Result Date: 05/21/2019   ECHOCARDIOGRAM LIMITED  REPORT   Patient Name:   CRAVEN CREAN Date of Exam: 05/21/2019 Medical Rec #:  [id number]     Height:       70.0 in Accession #:    [id number]    Weight:       220.0 lb Date of Birth:  [birth year]     BSA:          2.17 m Patient Age:    68 years      BP:           152/86 mmHg Patient Gender: M             HR:           83 bpm. Exam Location:  Inpatient  Procedure: Limited Echo, Cardiac Doppler and Limited Color Doppler Indications:    R94.31 Abnormal EKG  History:        Patient has prior history of Echocardiogram examinations, most                 recent 06/15/2014. COVID-19 Positive.  Sonographer:    Elmarie Shiley Dance Referring Phys: [id number] Gardiner Ramus Marwa Fuhrman  Sonographer Comments: Suboptimal subcostal window. IMPRESSIONS  1. Left ventricular ejection fraction, by visual estimation, is 50 to 55%. The left ventricle has normal function.  2. Global right ventricle has normal systolc function.The right ventricular size is normal. no increase in right ventricular wall thickness.  3. The mitral valve is normal in structure. Trivial mitral valve regurgitation.  4. The tricuspid valve was normal in structure. Tricuspid valve regurgitation is trivial.  5. Tricuspid valve regurgitation is trivial.   6. The pulmonic valve was normal in structure. FINDINGS  Left Ventricle: Left ventricular ejection fraction, by visual estimation, is 50 to 55%. The left ventricle has normal function. Right Ventricle: The right ventricular size is normal. No increase in right ventricular wall thickness. Global RV systolic function is has normal systolic function. Mitral Valve: The mitral valve is normal in structure. MV Area by PHT, 3.42 cm. MV PHT, 64.38 msec. Trivial mitral valve regurgitation. Tricuspid Valve: The tricuspid valve is normal in structure. Tricuspid valve regurgitation is trivial. Aortic Valve: The aortic valve is normal in structure. Aortic valve regurgitation is mild. Aortic regurgitation PHT measures 481 msec. Pulmonic Valve: The pulmonic valve was normal in structure. Pulmonic valve regurgitation is not visualized by color flow Doppler. Pulmonic regurgitation is not visualized by color flow Doppler. Aorta: The aortic root and ascending aorta are structurally normal, with no evidence of dilitation.  LEFT VENTRICLE          Normals PLAX 2D LVIDd:         4.94 cm  3.6 cm LVIDs:         3.34 cm  1.7 cm LV PW:         1.13 cm  1.4 cm LV IVS:        1.01 cm  1.3 cm LVOT diam:     2.10 cm  2.0 cm LV SV:         70 ml    79 ml LV SV Index:   30.92    45 ml/m2 LVOT Area:     3.46 cm 3.14 cm2  RIGHT VENTRICLE RV Basal diam:  2.35 cm LEFT ATRIUM             Index       RIGHT ATRIUM          Index LA diam:  4.40 cm 2.02 cm/m  RA Area:     9.83 cm LA Vol (A2C):   41.2 ml 18.95 ml/m RA Volume:   18.00 ml 8.28 ml/m LA Vol (A4C):   41.4 ml 19.05 ml/m LA Biplane Vol: 39.6 ml 18.22 ml/m  AORTIC VALVE             Normals LVOT Vmax:   104.05 cm/s LVOT Vmean:  56.550 cm/s 75 cm/s LVOT VTI:    0.176 m     25.3 cm AI PHT:      481 msec  AORTA                 Normals Ao Root diam: 3.70 cm 31 mm Ao Asc diam:  3.60 cm 31 mm MITRAL VALVE              Normals MV Area (PHT): 3.42 cm             SHUNTS MV PHT:        64.38  msec 55 ms     Systemic VTI:  0.18 m MV Decel Time: 222 msec   187 ms    Systemic Diam: 2.10 cm MV E velocity: 48.50 cm/s 103 cm/s MV A velocity: 74.70 cm/s 70.3 cm/s MV E/A ratio:  0.65       1.5  Kristeen Miss MD Electronically signed by Kristeen Miss MD Signature Date/Time: 05/21/2019/4:56:09 PMThe mitral valve is normal in structure.    Final         Scheduled Meds: . amLODipine  10 mg Oral Daily  . vitamin C  500 mg Oral Daily  . dexamethasone (DECADRON) injection  6 mg Intravenous Q24H  . enoxaparin (LOVENOX) injection  40 mg Subcutaneous Daily  . Ipratropium-Albuterol  1 puff Inhalation Q6H  . losartan  100 mg Oral Daily  . zinc sulfate  220 mg Oral Daily   Continuous Infusions:   LOS: 6 days     Alwyn Ren, MD [facility]  If 7PM-7AM, please contact night-coverage [email or web address] Password [facility] 05/22/2019, 12:43 PM

## 2019-05-22 NOTE — Progress Notes (Signed)
Bilateral lower extremity venous duplex complete.  Please see CV Proc tab for preliminary results. Levin Bacon- RDMS, RVT 5:05 PM  05/22/2019

## 2019-05-22 NOTE — Progress Notes (Signed)
Physical Therapy Treatment Patient Details Name: Devin Castro MRN: [id number] DOB: 1950/09/15 Today's Date: 05/22/2019    History of Present Illness 11 white male known history of COPD not on oxygen, HTN, status post bilateral knee replacement 06/09/2014, pulmonary embolism 2016, and hyperlipidemia. Admitted with pneumonia due to covid.    PT Comments    Pt making steady progress with mobility. Still requires supplemental O2.    Follow Up Recommendations  Home health PT;Supervision for mobility/OOB     Equipment Recommendations  None recommended by PT    Recommendations for Other Services       Precautions / Restrictions Precautions Precautions: Other (comment);Fall Precaution Comments: watch sats Restrictions Weight Bearing Restrictions: No    Mobility  Bed Mobility Overal bed mobility: Modified Independent Bed Mobility: Supine to Sit;Sit to Supine     Supine to sit: HOB elevated;Modified independent (Device/Increase time) Sit to supine: Modified independent (Device/Increase time);HOB elevated      Transfers Overall transfer level: Needs assistance Equipment used: None Transfers: Sit to/from Stand Sit to Stand: Min guard         General transfer comment: assist for lines/safety  Ambulation/Gait Ambulation/Gait assistance: Min guard Gait Distance (Feet): 150 Feet Assistive device: None Gait Pattern/deviations: Step-through pattern;Decreased stride length Gait velocity: decreased Gait velocity interpretation: 1.31 - 2.62 ft/sec, indicative of limited community ambulator General Gait Details: Slightly unsteady but no loss of balance. Amb on 6L O2 with SpO2 90-94%   Stairs             Wheelchair Mobility    Modified Rankin (Stroke Patients Only)       Balance Overall balance assessment: Needs assistance Sitting-balance support: No upper extremity supported;Feet supported Sitting balance-Leahy Scale: Good     Standing balance support: No  upper extremity supported;During functional activity Standing balance-Leahy Scale: Fair                              Cognition Arousal/Alertness: Awake/alert Behavior During Therapy: WFL for tasks assessed/performed Overall Cognitive Status: Within Functional Limits for tasks assessed                                        Exercises      General Comments        Pertinent Vitals/Pain Pain Assessment: No/denies pain    Home Living                      Prior Function            PT Goals (current goals can now be found in the care plan section) Acute Rehab PT Goals Patient Stated Goal: home Progress towards PT goals: Progressing toward goals    Frequency    Min 3X/week      PT Plan Current plan remains appropriate    Co-evaluation              AM-PAC PT "6 Clicks" Mobility   Outcome Measure  Help needed turning from your back to your side while in a flat bed without using bedrails?: None Help needed moving from lying on your back to sitting on the side of a flat bed without using bedrails?: None Help needed moving to and from a bed to a chair (including a wheelchair)?: A Little Help needed standing up from a chair using  your arms (e.g., wheelchair or bedside chair)?: A Little Help needed to walk in hospital room?: A Little Help needed climbing 3-5 steps with a railing? : A Little 6 Click Score: 20    End of Session Equipment Utilized During Treatment: Oxygen Activity Tolerance: Patient tolerated treatment well Patient left: in bed;with call bell/phone within reach   PT Visit Diagnosis: Difficulty in walking, not elsewhere classified (R26.2);Muscle weakness (generalized) (M62.81)     Time: 5374-8270 PT Time Calculation (min) (ACUTE ONLY): 19 min  Charges:  $Gait Training: 8-22 mins                     [place] Pager [phone] Office [place] 05/22/2019, 5:15 PM

## 2019-05-23 ENCOUNTER — Inpatient Hospital Stay (HOSPITAL_COMMUNITY): Payer: PPO

## 2019-05-23 DIAGNOSIS — J9601 Acute respiratory failure with hypoxia: Secondary | ICD-10-CM

## 2019-05-23 DIAGNOSIS — I10 Essential (primary) hypertension: Secondary | ICD-10-CM

## 2019-05-23 LAB — LIPID PANEL
Cholesterol: 117 mg/dL (ref 0–200)
HDL: 29 mg/dL — ABNORMAL LOW (ref >40)
LDL Cholesterol: 61 mg/dL (ref 0–99)
Total CHOL/HDL Ratio: 4 RATIO
Triglycerides: 135 mg/dL (ref <150)
VLDL: 27 mg/dL (ref 0–40)

## 2019-05-23 LAB — COMPREHENSIVE METABOLIC PANEL
ALT: 65 U/L — ABNORMAL HIGH (ref 0–44)
AST: 31 U/L (ref 15–41)
Albumin: 2.8 g/dL — ABNORMAL LOW (ref 3.5–5.0)
Alkaline Phosphatase: 42 U/L (ref 38–126)
Anion gap: 8 (ref 5–15)
BUN: 23 mg/dL (ref 8–23)
CO2: 26 mmol/L (ref 22–32)
Calcium: 8.7 mg/dL — ABNORMAL LOW (ref 8.9–10.3)
Chloride: 105 mmol/L (ref 98–111)
Creatinine, Ser: 0.91 mg/dL (ref 0.61–1.24)
GFR calc Af Amer: >60 mL/min (ref >60)
GFR calc non Af Amer: >60 mL/min (ref >60)
Glucose, Bld: 120 mg/dL — ABNORMAL HIGH (ref 70–99)
Potassium: 4.8 mmol/L (ref 3.5–5.1)
Sodium: 139 mmol/L (ref 135–145)
Total Bilirubin: 0.9 mg/dL (ref 0.3–1.2)
Total Protein: 6.6 g/dL (ref 6.5–8.1)

## 2019-05-23 LAB — CBC WITH DIFFERENTIAL/PLATELET
Abs Immature Granulocytes: 0.79 10*3/uL — ABNORMAL HIGH (ref 0.00–0.07)
Basophils Absolute: 0 10*3/uL (ref 0.0–0.1)
Basophils Relative: 0 %
Eosinophils Absolute: 0 10*3/uL (ref 0.0–0.5)
Eosinophils Relative: 0 %
HCT: 50.4 % (ref 39.0–52.0)
Hemoglobin: 16.9 g/dL (ref 13.0–17.0)
Immature Granulocytes: 7 %
Lymphocytes Relative: 6 %
Lymphs Abs: 0.7 10*3/uL (ref 0.7–4.0)
MCH: 33.3 pg (ref 26.0–34.0)
MCHC: 33.5 g/dL (ref 30.0–36.0)
MCV: 99.4 fL (ref 80.0–100.0)
Monocytes Absolute: 0.3 10*3/uL (ref 0.1–1.0)
Monocytes Relative: 3 %
Neutro Abs: 9.9 10*3/uL — ABNORMAL HIGH (ref 1.7–7.7)
Neutrophils Relative %: 84 %
Platelets: 466 10*3/uL — ABNORMAL HIGH (ref 150–400)
RBC: 5.07 MIL/uL (ref 4.22–5.81)
RDW: 13 % (ref 11.5–15.5)
WBC: 11.8 10*3/uL — ABNORMAL HIGH (ref 4.0–10.5)
nRBC: 0 % (ref 0.0–0.2)

## 2019-05-23 LAB — C-REACTIVE PROTEIN: CRP: 0.5 mg/dL (ref <1.0)

## 2019-05-23 LAB — HEMOGLOBIN A1C
Hgb A1c MFr Bld: 5.5 % (ref 4.8–5.6)
Mean Plasma Glucose: 111.15 mg/dL

## 2019-05-23 LAB — D-DIMER, QUANTITATIVE: D-Dimer, Quant: 2.17 ug/mL-FEU — ABNORMAL HIGH (ref 0.00–0.50)

## 2019-05-23 LAB — MRSA PCR SCREENING: MRSA by PCR: NEGATIVE

## 2019-05-23 LAB — FERRITIN: Ferritin: 625 ng/mL — ABNORMAL HIGH (ref 24–336)

## 2019-05-23 MED ORDER — VANCOMYCIN HCL 1250 MG/250ML IV SOLN: 1250.0000 mg | INTRAVENOUS | Status: DC

## 2019-05-23 MED ORDER — IOHEXOL 350 MG/ML SOLN
100.0000 mL | Freq: Once | INTRAVENOUS | Status: AC | PRN
  Administered 2019-05-23: 100 mL via INTRAVENOUS

## 2019-05-23 MED ORDER — IPRATROPIUM-ALBUTEROL 20-100 MCG/ACT IN AERS
1.0000 | INHALATION_SPRAY | Freq: Three times a day (TID) | RESPIRATORY_TRACT | Status: DC
  Administered 2019-05-24 (×2): 1 via RESPIRATORY_TRACT

## 2019-05-23 MED ORDER — VANCOMYCIN HCL 2000 MG/400ML IV SOLN
2000.0000 mg | Freq: Once | INTRAVENOUS | Status: AC
  Administered 2019-05-23: 2000 mg via INTRAVENOUS
  Filled 2019-05-23: qty 400

## 2019-05-23 MED ORDER — SODIUM CHLORIDE 0.9 % IV SOLN
2.0000 g | Freq: Three times a day (TID) | INTRAVENOUS | Status: DC
  Administered 2019-05-23 – 2019-05-24 (×3): 2 g via INTRAVENOUS
  Filled 2019-05-23 (×3): qty 2

## 2019-05-23 NOTE — Progress Notes (Signed)
PROGRESS NOTE    Devin Castro    Code Status: Full Code  ZOX:[id number] DOB: [birth year] DOA: 05/16/2019  PCP: Eartha Inch, MD    Hospital Summary  This is a 69 year old male with history of COPD, PE previously on anticoagulation, osteoarthritis, vertigo with recent diagnosis of COVID-19 pneumonia, initially thought COPD flare, presented to ED previously but declined admission, went home and came back/admission 12/30.  Since has been having increasing O2 requirements, negative CTA chest 12/28 for PE.  Has completed 5-day course remdesivir, Actemra x1 and will complete 10 days dexamethasone 1/8.  A & P   Principal Problem:   Pneumonia due to COVID-19 virus Active Problems:   Pulmonary embolism (HCC)   Hypertension   COPD (chronic obstructive pulmonary disease) (HCC)   1. Acute hypoxic respiratory failure secondary to Covid 19 a. Afebrile, tachycardic on ambulation, increasing O2 requirements, 4 L-> 6 L/min, D-dimer initially decreased to 1.54, now increased to 2.17, soft BP (SBP 105), new leukocytosis 11.5, EF 55%, status post Rocephin x5 days b. Status post remdesivir x5 days, Actemra x1, will finish 10 days dexamethasone 1/8 c. Repeat CTA chest to rule out acute PE d. Continue inhalers  2. Hypertension a. SBP 105 b. Hold ARB in a.m. in setting of contrast, continue amlodipine 3. Hyperlipidemia a. Lipid panel 4. COPD a. Does not appear to be in acute exacerbation 5. History of postoperative PE 2016 a. Completed 6 months Xarelto 6. Hyperglycemia a. HA1C    DVT prophylaxis: Lovenox Diet: Heart healthy Family Communication: Called patient's wife but no response Disposition Plan: Continue inpatient status pending further work-up of worsened hypoxia  Consultants  None  Procedures  None  Antibiotics   Anti-infectives (From admission, onward)   Start     Dose/Rate Route Frequency Ordered Stop   05/17/19 1000  remdesivir 100 mg in sodium chloride 0.9 % 100 mL  IVPB  Status:  Discontinued     100 mg 200 mL/hr over 30 Minutes Intravenous Daily 05/16/19 2251 05/16/19 2256   05/17/19 1000  remdesivir 100 mg in sodium chloride 0.9 % 100 mL IVPB     100 mg 200 mL/hr over 30 Minutes Intravenous Daily 05/16/19 2132 05/20/19 0945   05/16/19 2300  azithromycin (ZITHROMAX) tablet 500 mg     500 mg Oral  Once 05/16/19 2251 05/17/19 0019   05/16/19 2300  cefTRIAXone (ROCEPHIN) 1 g in sodium chloride 0.9 % 100 mL IVPB  Status:  Discontinued     1 g 200 mL/hr over 30 Minutes Intravenous Daily at bedtime 05/16/19 2251 05/21/19 1439   05/16/19 2250  remdesivir 200 mg in sodium chloride 0.9% 250 mL IVPB  Status:  Discontinued     200 mg 580 mL/hr over 30 Minutes Intravenous Once 05/16/19 2251 05/16/19 2256   05/16/19 2145  remdesivir 200 mg in sodium chloride 0.9% 250 mL IVPB     200 mg 580 mL/hr over 30 Minutes Intravenous Once 05/16/19 2132 05/16/19 2309           Subjective   Patient seen and examined at bedside in no acute distress and resting comfortably. No acute events overnight. Denies any acute complaints at this time. Ambulating. Tolerating diet well.  Denies any chest pain, fever, chills, night sweats, nausea, vomiting, chest pain.  No other complaints.  Objective   Vitals:   05/22/19 1355 05/22/19 2100 05/23/19 1200 05/23/19 1253  BP: 120/79   105/73  Pulse: 94  86 91  Resp:  18  Temp: 98.4 F (36.9 C) 97.8 F (36.6 C)  97.7 F (36.5 C)  TempSrc: Oral Oral  Oral  SpO2: 93% 92% 92% 93%  Weight:      Height:        Intake/Output Summary (Last 24 hours) at 05/23/2019 1352 Last data filed at 05/23/2019 1022 Gross per 24 hour  Intake --  Output 1250 ml  Net -1250 ml   Filed Weights   05/16/19 1845  Weight: 99.8 kg    Examination:  Physical Exam Vitals and nursing note reviewed.  Constitutional:      Appearance: Normal appearance.     Comments: Nasal cannula  HENT:     Head: Normocephalic and atraumatic.     Nose: Nose  normal.  Eyes:     Extraocular Movements: Extraocular movements intact.  Cardiovascular:     Rate and Rhythm: Normal rate and regular rhythm.  Pulmonary:     Effort: Pulmonary effort is normal.     Breath sounds: Normal breath sounds.  Abdominal:     General: Abdomen is flat.     Palpations: Abdomen is soft.  Musculoskeletal:        General: No swelling.     Cervical back: Normal range of motion. No rigidity.  Neurological:     General: No focal deficit present.     Mental Status: He is alert. Mental status is at baseline.  Psychiatric:        Mood and Affect: Mood normal.        Behavior: Behavior normal.     Data Reviewed: I have personally reviewed following labs and imaging studies  CBC: Recent Labs  Lab 05/18/19 0310 05/19/19 0500 05/20/19 0322 05/21/19 0245 05/23/19 0907  WBC 9.3 7.1 5.3 5.9 11.8*  NEUTROABS 8.3* 6.4 4.7 4.8 9.9*  HGB 15.5 14.8 15.6 16.2 16.9  HCT 46.0 44.2 45.8 46.8 50.4  MCV 98.7 98.7 97.2 96.3 99.4  PLT 265 298 303 343 299*   Basic Metabolic Panel: Recent Labs  Lab 05/18/19 0310 05/19/19 0500 05/20/19 0322 05/21/19 0245 05/23/19 0907  NA 141 139 136 139 139  K 3.8 4.2 4.4 3.9 4.8  CL 106 106 104 101 105  CO2 26 22 22 25 26   GLUCOSE 139* 123* 155* 155* 120*  BUN 25* 19 17 16 23   CREATININE 0.99 0.76 0.74 0.77 0.91  CALCIUM 8.5* 8.3* 8.4* 8.5* 8.7*   GFR: Estimated Creatinine Clearance: 94.5 mL/min (by C-G formula based on SCr of 0.91 mg/dL). Liver Function Tests: Recent Labs  Lab 05/18/19 0310 05/19/19 0500 05/20/19 0322 05/21/19 0245 05/23/19 0907  AST 31 35 60* 63* 31  ALT 32 33 57* 75* 65*  ALKPHOS 42 40 39 37* 42  BILITOT 0.5 0.4 0.6 0.7 0.9  PROT 6.5 6.3* 6.2* 6.5 6.6  ALBUMIN 2.5* 2.5* 2.6* 2.7* 2.8*   No results for input(s): LIPASE, AMYLASE in the last 168 hours. No results for input(s): AMMONIA in the last 168 hours. Coagulation Profile: No results for input(s): INR, PROTIME in the last 168 hours. Cardiac  Enzymes: No results for input(s): CKTOTAL, CKMB, CKMBINDEX, TROPONINI in the last 168 hours. BNP (last 3 results) No results for input(s): PROBNP in the last 8760 hours. HbA1C: No results for input(s): HGBA1C in the last 72 hours. CBG: Recent Labs  Lab 05/16/19 2144  GLUCAP 128*   Lipid Profile: No results for input(s): CHOL, HDL, LDLCALC, TRIG, CHOLHDL, LDLDIRECT in the last 72 hours. Thyroid Function Tests:  No results for input(s): TSH, T4TOTAL, FREET4, T3FREE, THYROIDAB in the last 72 hours. Anemia Panel: Recent Labs    05/21/19 0245 05/23/19 0907  FERRITIN 751* 625*   Sepsis Labs: Recent Labs  Lab 05/16/19 1912 05/16/19 1944 05/16/19 2112  PROCALCITON 0.31  --   --   LATICACIDVEN  --  2.0* 1.2    Recent Results (from the past 240 hour(s))  Respiratory Panel by RT PCR (Flu A&B, Covid) - Nasopharyngeal Swab     Status: Abnormal   Collection Time: 05/14/19 12:25 PM   Specimen: Nasopharyngeal Swab  Result Value Ref Range Status   SARS Coronavirus 2 by RT PCR POSITIVE (A) NEGATIVE Final    Comment: RESULT CALLED TO, READ BACK BY AND VERIFIED WITH: WHITE,M@[id number]  BY MATTHEWS, B 12.28.2020 (NOTE) SARS-CoV-2 target nucleic acids are DETECTED. SARS-CoV-2 RNA is generally detectable in upper respiratory specimens  during the acute phase of infection. Positive results are indicative of the presence of the identified virus, but do not rule out bacterial infection or co-infection with other pathogens not detected by the test. Clinical correlation with patient history and other diagnostic information is necessary to determine patient infection status. The expected result is Negative. Fact Sheet for Patients:  https://www.moore.com/ Fact Sheet for Healthcare Providers: https://www.young.biz/ This test is not yet approved or cleared by the [place] FDA and  has been authorized for detection and/or diagnosis of SARS-CoV-2 by FDA  under an Emergency Use Authorization (EUA).  This EUA will remain in effect (meaning this test can be used ) for the duration of  the COVID-19 declaration under Section 564(b)(1) of the Act, 21 U.S.C. section 360bbb-3(b)(1), unless the authorization is terminated or revoked sooner.    Influenza A by PCR NEGATIVE NEGATIVE Final   Influenza B by PCR NEGATIVE NEGATIVE Final    Comment: (NOTE) The Xpert Xpress SARS-CoV-2/FLU/RSV assay is intended as an aid in  the diagnosis of influenza from Nasopharyngeal swab specimens and  should not be used as a sole basis for treatment. Nasal washings and  aspirates are unacceptable for Xpert Xpress SARS-CoV-2/FLU/RSV  testing. Fact Sheet for Patients: https://www.moore.com/ Fact Sheet for Healthcare Providers: https://www.young.biz/ This test is not yet approved or cleared by the [place] FDA and  has been authorized for detection and/or diagnosis of SARS-CoV-2 by  FDA under an Emergency Use Authorization (EUA). This EUA will remain  in effect (meaning this test can be used) for the duration of the  Covid-19 declaration under Section 564(b)(1) of the Act, 21  U.S.C. section 360bbb-3(b)(1), unless the authorization is  terminated or revoked. Performed at [facility], [address] [place], [place] [address]   Blood Culture (routine x 2)     Status: None   Collection Time: 05/16/19  6:50 PM   Specimen: BLOOD  Result Value Ref Range Status   Specimen Description BLOOD LEFT ANTECUBITAL  Final   Special Requests   Final    BOTTLES DRAWN AEROBIC AND ANAEROBIC Blood Culture results may not be optimal due to an inadequate volume of blood received in culture bottles   Culture   Final    NO GROWTH 5 DAYS Performed at [facility], [address], [place], [place] [address]    Report Status 05/21/2019 FINAL  Final  Blood Culture (routine x 2)     Status: None   Collection Time: 05/16/19  8:14 PM    Specimen: BLOOD  Result Value Ref Range Status   Specimen Description BLOOD  LEFT ANTECUBITAL  Final   Special Requests   Final    BOTTLES DRAWN AEROBIC ONLY Blood Culture adequate volume   Culture   Final    NO GROWTH 5 DAYS Performed at [facility], [address] [place], [place] [address]    Report Status 05/21/2019 FINAL  Final         Radiology Studies: DG Chest 1 View  Result Date: 05/22/2019 CLINICAL DATA:  Hypoxemia.  COVID-19 positive EXAM: CHEST  1 VIEW COMPARISON:  05/18/2019 FINDINGS: Elevated right hemidiaphragm unchanged. Patchy bibasilar airspace disease unchanged from the prior study. Negative for edema or effusion. IMPRESSION: Bilateral airspace disease unchanged, consistent with pneumonia. Electronically Signed   By: Marlan Palau M.D.   On: 05/22/2019 13:17   VAS [place] LOWER EXTREMITY VENOUS (DVT)  Result Date: 05/22/2019  Lower Venous Study Indications: Edema, and D dimer.  Limitations: Atherosclerotic shadowing. Performing Technologist: Levada Schilling RDMS, RVT  Examination Guidelines: A complete evaluation includes B-mode imaging, spectral Doppler, color Doppler, and power Doppler as needed of all accessible portions of each vessel. Bilateral testing is considered an integral part of a complete examination. Limited examinations for reoccurring indications may be performed as noted.  +---------+---------------+---------+-----------+----------+--------------+ RIGHT    CompressibilityPhasicitySpontaneityPropertiesThrombus Aging +---------+---------------+---------+-----------+----------+--------------+ CFV      Full           Yes      Yes                                 +---------+---------------+---------+-----------+----------+--------------+ SFJ      Full                                                        +---------+---------------+---------+-----------+----------+--------------+ FV Prox  Full                                                         +---------+---------------+---------+-----------+----------+--------------+ FV Mid   Full                                                        +---------+---------------+---------+-----------+----------+--------------+ FV DistalFull                                                        +---------+---------------+---------+-----------+----------+--------------+ PFV      Full                                                        +---------+---------------+---------+-----------+----------+--------------+ POP      Full           Yes  Yes                                 +---------+---------------+---------+-----------+----------+--------------+ PTV      Full                                                        +---------+---------------+---------+-----------+----------+--------------+ PERO     Full                                                        +---------+---------------+---------+-----------+----------+--------------+ GSV      Full                                                        +---------+---------------+---------+-----------+----------+--------------+   +---------+---------------+---------+-----------+----------+--------------+ LEFT     CompressibilityPhasicitySpontaneityPropertiesThrombus Aging +---------+---------------+---------+-----------+----------+--------------+ CFV      Full           Yes      Yes                                 +---------+---------------+---------+-----------+----------+--------------+ SFJ      Full                                                        +---------+---------------+---------+-----------+----------+--------------+ FV Prox  Full                                                        +---------+---------------+---------+-----------+----------+--------------+ FV Mid   Full                                                         +---------+---------------+---------+-----------+----------+--------------+ FV DistalFull                                                        +---------+---------------+---------+-----------+----------+--------------+ PFV      Full                                                        +---------+---------------+---------+-----------+----------+--------------+ POP  Full           Yes      Yes                                 +---------+---------------+---------+-----------+----------+--------------+ PTV      Full                                                        +---------+---------------+---------+-----------+----------+--------------+ PERO     Full                                                        +---------+---------------+---------+-----------+----------+--------------+ GSV      Full                                                        +---------+---------------+---------+-----------+----------+--------------+     Summary: Right: There is no evidence of deep vein thrombosis in the lower extremity. No cystic structure found in the popliteal fossa. Left: There is no evidence of deep vein thrombosis in the lower extremity. No cystic structure found in the popliteal fossa.  *See table(s) above for measurements and observations. Electronically signed by Coral Else MD on 05/22/2019 at 1:43:56 PM.    Final    ECHOCARDIOGRAM LIMITED  Result Date: 05/21/2019   ECHOCARDIOGRAM LIMITED REPORT   Patient Name:   CASIMER RUSSETT Date of Exam: 05/21/2019 Medical Rec #:  [id number]     Height:       70.0 in Accession #:    [id number]    Weight:       220.0 lb Date of Birth:  [birth year]     BSA:          2.17 m Patient Age:    68 years      BP:           152/86 mmHg Patient Gender: M             HR:           83 bpm. Exam Location:  Inpatient  Procedure: Limited Echo, Cardiac Doppler and Limited Color Doppler Indications:    R94.31 Abnormal EKG  History:        Patient  has prior history of Echocardiogram examinations, most                 recent 06/15/2014. COVID-19 Positive.  Sonographer:    Elmarie Shiley Dance Referring Phys: [id number] Gardiner Ramus MATHEWS  Sonographer Comments: Suboptimal subcostal window. IMPRESSIONS  1. Left ventricular ejection fraction, by visual estimation, is 50 to 55%. The left ventricle has normal function.  2. Global right ventricle has normal systolc function.The right ventricular size is normal. no increase in right ventricular wall thickness.  3. The mitral valve is normal in structure. Trivial mitral valve regurgitation.  4. The tricuspid valve was normal in structure. Tricuspid valve regurgitation is trivial.  5. Tricuspid valve regurgitation is trivial.  6. The pulmonic valve was normal in structure. FINDINGS  Left Ventricle: Left ventricular ejection fraction, by visual estimation, is 50 to 55%. The left ventricle has normal function. Right Ventricle: The right ventricular size is normal. No increase in right ventricular wall thickness. Global RV systolic function is has normal systolic function. Mitral Valve: The mitral valve is normal in structure. MV Area by PHT, 3.42 cm. MV PHT, 64.38 msec. Trivial mitral valve regurgitation. Tricuspid Valve: The tricuspid valve is normal in structure. Tricuspid valve regurgitation is trivial. Aortic Valve: The aortic valve is normal in structure. Aortic valve regurgitation is mild. Aortic regurgitation PHT measures 481 msec. Pulmonic Valve: The pulmonic valve was normal in structure. Pulmonic valve regurgitation is not visualized by color flow Doppler. Pulmonic regurgitation is not visualized by color flow Doppler. Aorta: The aortic root and ascending aorta are structurally normal, with no evidence of dilitation.  LEFT VENTRICLE          Normals PLAX 2D LVIDd:         4.94 cm  3.6 cm LVIDs:         3.34 cm  1.7 cm LV PW:         1.13 cm  1.4 cm LV IVS:        1.01 cm  1.3 cm LVOT diam:     2.10 cm  2.0 cm LV SV:          70 ml    79 ml LV SV Index:   30.92    45 ml/m2 LVOT Area:     3.46 cm 3.14 cm2  RIGHT VENTRICLE RV Basal diam:  2.35 cm LEFT ATRIUM             Index       RIGHT ATRIUM          Index LA diam:        4.40 cm 2.02 cm/m  RA Area:     9.83 cm LA Vol (A2C):   41.2 ml 18.95 ml/m RA Volume:   18.00 ml 8.28 ml/m LA Vol (A4C):   41.4 ml 19.05 ml/m LA Biplane Vol: 39.6 ml 18.22 ml/m  AORTIC VALVE             Normals LVOT Vmax:   104.05 cm/s LVOT Vmean:  56.550 cm/s 75 cm/s LVOT VTI:    0.176 m     25.3 cm AI PHT:      481 msec  AORTA                 Normals Ao Root diam: 3.70 cm 31 mm Ao Asc diam:  3.60 cm 31 mm MITRAL VALVE              Normals MV Area (PHT): 3.42 cm             SHUNTS MV PHT:        64.38 msec 55 ms     Systemic VTI:  0.18 m MV Decel Time: 222 msec   187 ms    Systemic Diam: 2.10 cm MV E velocity: 48.50 cm/s 103 cm/s MV A velocity: 74.70 cm/s 70.3 cm/s MV E/A ratio:  0.65       1.5  Kristeen MissPhilip Nahser MD Electronically signed by Kristeen MissPhilip Nahser MD Signature Date/Time: 05/21/2019/4:56:09 PMThe mitral valve is normal in structure.    Final         Scheduled Meds: . amLODipine  10 mg Oral Daily  . vitamin C  500 mg Oral Daily  . dexamethasone (DECADRON) injection  6 mg Intravenous Q24H  . enoxaparin (LOVENOX) injection  40 mg Subcutaneous Daily  . Ipratropium-Albuterol  1 puff Inhalation Q6H  . zinc sulfate  220 mg Oral Daily   Continuous Infusions:   LOS: 7 days    Time spent: 25 minutes with over 50% of the time coordinating the patient's care    Jae DireJared E Artemis Loyal, DO [facility] Pager [phone]  If 7PM-7AM, please contact night-coverage [email or web address] Password [email or web address] 05/23/2019, 1:52 PM

## 2019-05-23 NOTE — Progress Notes (Addendum)
Pharmacy Antibiotic Note  Devin Castro is a 69 y.o. male  with respiratory failure due to COVID and was recently on rocephin for CAP. Pharmacy has been consulted for cefepime and vancomycin for HCAP -WBC= 11.8, afebrile, CXR with pneumonia -SCr = 0.9   Plan: -cefepime 2gm IV q8h -Vancomycin 2000mg  IV x1 followed by 1250mg  IV q24h (estimated AUC= 487 with SCr of 0.91) -Will follow renal function, cultures and clinical progress    Height: 5\' 10"  (177.8 cm) Weight: 220 lb (99.8 kg) IBW/kg (Calculated) : 73  Temp (24hrs), Avg:97.8 F (36.6 C), Min:97.7 F (36.5 C), Max:97.8 F (36.6 C)  Recent Labs  Lab 05/16/19 1944 05/16/19 2112 05/18/19 0310 05/19/19 0500 05/20/19 0322 05/21/19 0245 05/23/19 0907  WBC  --   --  9.3 7.1 5.3 5.9 11.8*  CREATININE  --   --  0.99 0.76 0.74 0.77 0.91  LATICACIDVEN 2.0* 1.2  --   --   --   --   --     Estimated Creatinine Clearance: 94.5 mL/min (by C-G formula based on SCr of 0.91 mg/dL).    No Known Allergies   Thank you for allowing pharmacy to be a part of this patient's care.  07/18/19, PharmD Clinical Pharmacist **Pharmacist phone directory can now be found on [email or web address] (PW [email or web address]).  Listed under [facility].

## 2019-05-24 LAB — CBC
HCT: 48.3 % (ref 39.0–52.0)
Hemoglobin: 15.9 g/dL (ref 13.0–17.0)
MCH: 33.1 pg (ref 26.0–34.0)
MCHC: 32.9 g/dL (ref 30.0–36.0)
MCV: 100.4 fL — ABNORMAL HIGH (ref 80.0–100.0)
Platelets: 434 10*3/uL — ABNORMAL HIGH (ref 150–400)
RBC: 4.81 MIL/uL (ref 4.22–5.81)
RDW: 13.2 % (ref 11.5–15.5)
WBC: 11.3 10*3/uL — ABNORMAL HIGH (ref 4.0–10.5)
nRBC: 0 % (ref 0.0–0.2)

## 2019-05-24 LAB — FERRITIN: Ferritin: 601 ng/mL — ABNORMAL HIGH (ref 24–336)

## 2019-05-24 LAB — BASIC METABOLIC PANEL
Anion gap: 11 (ref 5–15)
BUN: 30 mg/dL — ABNORMAL HIGH (ref 8–23)
CO2: 23 mmol/L (ref 22–32)
Calcium: 8.5 mg/dL — ABNORMAL LOW (ref 8.9–10.3)
Chloride: 106 mmol/L (ref 98–111)
Creatinine, Ser: 0.95 mg/dL (ref 0.61–1.24)
GFR calc Af Amer: >60 mL/min (ref >60)
GFR calc non Af Amer: >60 mL/min (ref >60)
Glucose, Bld: 132 mg/dL — ABNORMAL HIGH (ref 70–99)
Potassium: 4.5 mmol/L (ref 3.5–5.1)
Sodium: 140 mmol/L (ref 135–145)

## 2019-05-24 LAB — D-DIMER, QUANTITATIVE: D-Dimer, Quant: 1.24 ug/mL-FEU — ABNORMAL HIGH (ref 0.00–0.50)

## 2019-05-24 LAB — MAGNESIUM: Magnesium: 2.1 mg/dL (ref 1.7–2.4)

## 2019-05-24 LAB — C-REACTIVE PROTEIN: CRP: 0.5 mg/dL (ref <1.0)

## 2019-05-24 MED ORDER — IPRATROPIUM-ALBUTEROL 20-100 MCG/ACT IN AERS: 1.0000 | INHALATION_SPRAY | Freq: Four times a day (QID) | RESPIRATORY_TRACT | 0 refills | Status: DC | PRN

## 2019-05-24 MED ORDER — DEXAMETHASONE 6 MG PO TABS: 6.0000 mg | ORAL_TABLET | Freq: Every day | ORAL | 0 refills | Status: DC

## 2019-05-24 MED ORDER — LEVOFLOXACIN 750 MG PO TABS: 750.0000 mg | ORAL_TABLET | Freq: Every day | ORAL | 0 refills | Status: AC

## 2019-05-24 MED ORDER — DEXAMETHASONE 6 MG PO TABS
6.0000 mg | ORAL_TABLET | Freq: Every day | ORAL | Status: DC
  Administered 2019-05-24: 6 mg via ORAL
  Filled 2019-05-24: qty 1

## 2019-05-24 NOTE — Discharge Summary (Signed)
Physician Discharge Summary  Devin Castro NOM:[id number] DOB: 1950/09/13 DOA: 05/16/2019  PCP: Chesley Noon, MD  Admit date: 05/16/2019 Discharge date: 05/24/2019   Code Status: Full Code  Admitted From: Home Discharged to: [place]: PT Equipment/Devices: 4 L nasal cannula O2 Discharge Condition: Improving  Recommendations for Outpatient Follow-up   1. Follow up with PCP in 1 week 2. Please follow up BMP/CBC  3. Follow-up on O2 needs  Hospital Summary  This is a 69 year old male with history of COPD, PE previously on anticoagulation, osteoarthritis, vertigo with recent diagnosis of COVID-19 pneumonia, initially thought COPD flare, presented to ED previously but declined admission, went home and came back/admission 12/30.  He completed 5-day course remdesivir, Actemra x1 and was treated with IV dexamethasone while inpatient. Despite Covid treatment, he had been having increasing O2 requirements, with negative CTA chest 12/28 for PE.  Repeat CTA chest in setting of elevated D-dimer and requiring 6 L nasal cannula with leukocytosis on 1/6 showed no pulmonary embolus but new left lower lobe consolidation.  He was started on empiric treatment for HAP with vancomycin/cefepime.  MRSA nares negative and vancomycin was discontinued.  Had improvement in symptoms and improved O2 requirements per PT from 6 L/min to 4 L/min on ambulation.  He was discharged in improving condition on Levaquin 750 mg x 6 days for total 7 days antibiotics with instructions to self isolate and follow-up with PCP and lab work in 1 week  A & P   Principal Problem:   Pneumonia due to COVID-19 virus Active Problems:   Pulmonary embolism (HCC)   Hypertension   COPD (chronic obstructive pulmonary disease) ([place])   1. Acute hypoxic respiratory failure secondary to Covid 19 and HAP a. status post Rocephin x5 days without improvement.  CTA chest 12/28 - for PE however had increasing O2 requirements and leukocytosis  with elevated D-dimer on 1/6 prompting repeat CTA chest which showed new left lower lobe consolidation and he was started on empiric vancomycin/cefepime.  MRSA nares negative and vancomycin discontinued.  Had improved symptoms, WBC stabilized at 11 and afebrile. b. Status post remdesivir x5 days, Actemra x1, will finish 10 days dexamethasone 1/8 c. Discharged on Levaquin 750 mg x 6 days for total 7 days antibiotics, discussed with pharmacy d. Continue Combivent every 6 hours as needed  2. Hypertension a. Restart home meds at discharge 3. Hyperlipidemia a. Lipid panel 4. COPD a. Does not appear to be in acute exacerbation 5. History of postoperative PE 2016 a. Completed 6 months Xarelto 6. Hyperglycemia a. 1/6 HA1C: 5.5     Consultants  . None  Procedures  . None  Antibiotics   Anti-infectives (From admission, onward)   Start     Dose/Rate Route Frequency Ordered Stop   05/25/19 0000  levofloxacin (LEVAQUIN) 750 MG tablet     750 mg Oral Daily 05/24/19 1513 05/31/19 2359   05/24/19 1800  vancomycin (VANCOREADY) IVPB 1250 mg/250 mL  Status:  Discontinued     1,250 mg 166.7 mL/hr over 90 Minutes Intravenous Every 24 hours 05/23/19 1731 05/24/19 0808   05/23/19 1830  ceFEPIme (MAXIPIME) 2 g in sodium chloride 0.9 % 100 mL IVPB     2 g 200 mL/hr over 30 Minutes Intravenous Every 8 hours 05/23/19 1731     05/23/19 1800  vancomycin (VANCOREADY) IVPB 2000 mg/400 mL     2,000 mg 200 mL/hr over 120 Minutes Intravenous  Once 05/23/19 1731 05/23/19 2100   05/17/19 1000  remdesivir 100 mg in sodium chloride 0.9 % 100 mL IVPB  Status:  Discontinued     100 mg 200 mL/hr over 30 Minutes Intravenous Daily 05/16/19 2251 05/16/19 2256   05/17/19 1000  remdesivir 100 mg in sodium chloride 0.9 % 100 mL IVPB     100 mg 200 mL/hr over 30 Minutes Intravenous Daily 05/16/19 2132 05/20/19 0945   05/16/19 2300  azithromycin (ZITHROMAX) tablet 500 mg     500 mg Oral  Once 05/16/19 2251 05/17/19  0019   05/16/19 2300  cefTRIAXone (ROCEPHIN) 1 g in sodium chloride 0.9 % 100 mL IVPB  Status:  Discontinued     1 g 200 mL/hr over 30 Minutes Intravenous Daily at bedtime 05/16/19 2251 05/21/19 1439   05/16/19 2250  remdesivir 200 mg in sodium chloride 0.9% 250 mL IVPB  Status:  Discontinued     200 mg 580 mL/hr over 30 Minutes Intravenous Once 05/16/19 2251 05/16/19 2256   05/16/19 2145  remdesivir 200 mg in sodium chloride 0.9% 250 mL IVPB     200 mg 580 mL/hr over 30 Minutes Intravenous Once 05/16/19 2132 05/16/19 2309        Subjective  Patient seen and examined in bedside chair sitting upright in no acute distress with nasal cannula.  PT at bedside as well.  Patient reports his dyspnea has improved and is anticipating discharge today.  Per PT, patient had ambulated around the entire floor with 4 L nasal cannula.  He denies any chest pain, nausea, vomiting, diarrhea, constipation.  Denies any fever.  No other complaints   Objective   Discharge Exam: Vitals:   05/23/19 2331 05/24/19 0500  BP: 121/86 123/73  Pulse: 81   Resp:    Temp: 97.9 F (36.6 C) 98.3 F (36.8 C)  SpO2: 95%    Vitals:   05/23/19 2051 05/23/19 2053 05/23/19 2331 05/24/19 0500  BP: 118/81  121/86 123/73  Pulse: 74  81   Resp:      Temp: 97.9 F (36.6 C) 97.8 F (36.6 C) 97.9 F (36.6 C) 98.3 F (36.8 C)  TempSrc: Oral  Oral   SpO2:   95%   Weight:      Height:        Physical Exam Vitals and nursing note reviewed.  Constitutional:      Appearance: Normal appearance.  HENT:     Head: Normocephalic and atraumatic.     Nose: Nose normal.     Mouth/Throat:     Mouth: Mucous membranes are moist.  Eyes:     Extraocular Movements: Extraocular movements intact.  Cardiovascular:     Rate and Rhythm: Normal rate and regular rhythm.  Pulmonary:     Effort: Pulmonary effort is normal.     Breath sounds: Normal breath sounds.     Comments: Nasal cannula Abdominal:     General: Abdomen is  flat.     Palpations: Abdomen is soft.  Musculoskeletal:        General: No swelling. Normal range of motion.     Cervical back: Normal range of motion. No rigidity.  Neurological:     General: No focal deficit present.     Mental Status: He is alert. Mental status is at baseline.  Psychiatric:        Mood and Affect: Mood normal.        Behavior: Behavior normal.       The results of significant diagnostics from this hospitalization (including imaging,  microbiology, ancillary and laboratory) are listed below for reference.     Microbiology: Recent Results (from the past 240 hour(s))  Blood Culture (routine x 2)     Status: None   Collection Time: 05/16/19  6:50 PM   Specimen: BLOOD  Result Value Ref Range Status   Specimen Description BLOOD LEFT ANTECUBITAL  Final   Special Requests   Final    BOTTLES DRAWN AEROBIC AND ANAEROBIC Blood Culture results may not be optimal due to an inadequate volume of blood received in culture bottles   Culture   Final    NO GROWTH 5 DAYS Performed at [facility], [place] [address] [place], [place] [address]    Report Status 05/21/2019 FINAL  Final  Blood Culture (routine x 2)     Status: None   Collection Time: 05/16/19  8:14 PM   Specimen: BLOOD  Result Value Ref Range Status   Specimen Description BLOOD LEFT ANTECUBITAL  Final   Special Requests   Final    BOTTLES DRAWN AEROBIC ONLY Blood Culture adequate volume   Culture   Final    NO GROWTH 5 DAYS Performed at [facility], [place] [address], [place], [place] [address]    Report Status 05/21/2019 FINAL  Final  MRSA PCR Screening     Status: None   Collection Time: 05/23/19  5:32 PM   Specimen: Nasal Mucosa; Nasopharyngeal  Result Value Ref Range Status   MRSA by PCR NEGATIVE NEGATIVE Final    Comment:        The GeneXpert MRSA Assay (FDA approved for NASAL specimens only), is one component of a comprehensive MRSA colonization surveillance program. It is not intended  to diagnose MRSA infection nor to guide or monitor treatment for MRSA infections. Performed at [facility], [place] [address] [place], [place] [address]      Labs: BNP (last 3 results) Recent Labs    05/16/19 1930  BNP 81.0   Basic Metabolic Panel: Recent Labs  Lab 05/19/19 0500 05/20/19 0322 05/21/19 0245 05/23/19 0907 05/24/19 0345  NA 139 136 139 139 140  K 4.2 4.4 3.9 4.8 4.5  CL 106 104 101 105 106  CO2 _0 GLUCOSE 123* 155* 155* 120* 132*  BUN _1 30*  CREATININE 0.76 0.74 0.77 0.91 0.95  CALCIUM 8.3* 8.4* 8.5* 8.7* 8.5*  MG  --   --   --   --  2.1   Liver Function Tests: Recent Labs  Lab 05/18/19 0310 05/19/19 0500 05/20/19 0322 05/21/19 0245 05/23/19 0907  AST 31 35 60* 63* 31  ALT 32 33 57* 75* 65*  ALKPHOS 42 40 39 37* 42  BILITOT 0.5 0.4 0.6 0.7 0.9  PROT 6.5 6.3* 6.2* 6.5 6.6  ALBUMIN 2.5* 2.5* 2.6* 2.7* 2.8*   No results for input(s): LIPASE, AMYLASE in the last 168 hours. No results for input(s): AMMONIA in the last 168 hours. CBC: Recent Labs  Lab 05/18/19 0310 05/19/19 0500 05/20/19 0322 05/21/19 0245 05/23/19 0907 05/24/19 0345  WBC 9.3 7.1 5.3 5.9 11.8* 11.3*  NEUTROABS 8.3* 6.4 4.7 4.8 9.9*  --   HGB 15.5 14.8 15.6 16.2 16.9 15.9  HCT 46.0 44.2 45.8 46.8 50.4 48.3  MCV 98.7 98.7 97.2 96.3 99.4 100.4*  PLT 265 298 303 343 466* 434*   Cardiac Enzymes: No results for input(s): CKTOTAL, CKMB, CKMBINDEX, TROPONINI in the last 168 hours. BNP: Invalid  input(s): POCBNP CBG: No results for input(s): GLUCAP in the last 168 hours. D-Dimer Recent Labs    05/23/19 0907 05/24/19 0345  DDIMER 2.17* 1.24*   Hgb A1c Recent Labs    05/23/19 1412  HGBA1C 5.5   Lipid Profile Recent Labs    05/23/19 0500  CHOL 117  HDL 29*  LDLCALC 61  TRIG 135  CHOLHDL 4.0   Thyroid function studies No results for input(s): TSH, T4TOTAL, T3FREE, THYROIDAB in the last 72 hours.  Invalid input(s): FREET3 Anemia  work up Recent Labs    05/23/19 0907 05/24/19 0345  FERRITIN 625* 601*   Urinalysis    Component Value Date/Time   COLORURINE AMBER (A) 05/17/2019 0446   APPEARANCEUR HAZY (A) 05/17/2019 0446   LABSPEC 1.017 05/17/2019 0446   PHURINE 5.0 05/17/2019 0446   GLUCOSEU NEGATIVE 05/17/2019 0446   HGBUR LARGE (A) 05/17/2019 0446   BILIRUBINUR NEGATIVE 05/17/2019 0446   KETONESUR NEGATIVE 05/17/2019 0446   PROTEINUR NEGATIVE 05/17/2019 0446   UROBILINOGEN 0.2 05/24/2014 0843   NITRITE NEGATIVE 05/17/2019 0446   LEUKOCYTESUR NEGATIVE 05/17/2019 0446   Sepsis Labs Invalid input(s): PROCALCITONIN,  WBC,  LACTICIDVEN Microbiology Recent Results (from the past 240 hour(s))  Blood Culture (routine x 2)     Status: None   Collection Time: 05/16/19  6:50 PM   Specimen: BLOOD  Result Value Ref Range Status   Specimen Description BLOOD LEFT ANTECUBITAL  Final   Special Requests   Final    BOTTLES DRAWN AEROBIC AND ANAEROBIC Blood Culture results may not be optimal due to an inadequate volume of blood received in culture bottles   Culture   Final    NO GROWTH 5 DAYS Performed at [facility], [place] [address], [place], [place] [address]    Report Status 05/21/2019 FINAL  Final  Blood Culture (routine x 2)     Status: None   Collection Time: 05/16/19  8:14 PM   Specimen: BLOOD  Result Value Ref Range Status   Specimen Description BLOOD LEFT ANTECUBITAL  Final   Special Requests   Final    BOTTLES DRAWN AEROBIC ONLY Blood Culture adequate volume   Culture   Final    NO GROWTH 5 DAYS Performed at [facility], [place] [address] [place], [place] [address]    Report Status 05/21/2019 FINAL  Final  MRSA PCR Screening     Status: None   Collection Time: 05/23/19  5:32 PM   Specimen: Nasal Mucosa; Nasopharyngeal  Result Value Ref Range Status   MRSA by PCR NEGATIVE NEGATIVE Final    Comment:        The GeneXpert MRSA Assay (FDA approved for NASAL specimens only), is one  component of a comprehensive MRSA colonization surveillance program. It is not intended to diagnose MRSA infection nor to guide or monitor treatment for MRSA infections. Performed at [facility], [place] [address], [place], [place] [address]     Discharge Instructions     Discharge Instructions    Diet - low sodium heart healthy   Complete by: As directed    Discharge instructions   Complete by: As directed    You were seen in the hospital for COVID-19.  Upon discharge:  -Take Decadron 6 mg daily for the next 1 day, (05/25/19) -Use 4-6 L/min of O2 while at home and monitor your pulse ox at home if you have an oximeter. If you are having worsening shortness of breath  or your O2% is persistently less than 88%, then contact your primary care physician or return to the ED -Take Levaquin daily for the next 6 days starting 05/25/19 to completion, do not skip doses  -Get lab work in one week and follow up with your primary care physician in the office  You are still contagious with COVID-19 and should self isolate at home.  Isolation can be discontinued when the following criteria are met:  At least 10 days have passed since symptoms first appeared AND You are at least 1 day (24 hours) since resolution of fever without the use of any fever reducing medications (Tylenol, Motrin, ibuprofen, etc.) AND There is improvement in your symptoms (ex. shortness of breath, cough, etc.)  If you have any questions do not hesitate to contact your primary care physician or return to the ED if worsening symptoms.   Increase activity slowly   Complete by: As directed    MyChart COVID-19 home monitoring program   Complete by: May 24, 2019    Is the patient willing to use the [place] for home monitoring?: Yes   Temperature monitoring   Complete by: May 24, 2019    After how many days would you like to receive a notification of this patient's flowsheet entries?: 1     Allergies as of  05/24/2019   No Known Allergies     Medication List    TAKE these medications   amLODipine 10 MG tablet Commonly known as: NORVASC Take 10 mg by mouth every evening.   dexamethasone 6 MG tablet Commonly known as: DECADRON Take 1 tablet (6 mg total) by mouth daily. Start taking on: May 25, 2019   Ipratropium-Albuterol 20-100 MCG/ACT Aers respimat Commonly known as: COMBIVENT Inhale 1 puff into the lungs every 6 (six) hours as needed for wheezing or shortness of breath.   levofloxacin 750 MG tablet Commonly known as: Levaquin Take 1 tablet (750 mg total) by mouth daily for 6 days. Start taking on: May 25, 2019   losartan 100 MG tablet Commonly known as: COZAAR Take 100 mg by mouth every evening.   ondansetron 4 MG disintegrating tablet Commonly known as: Zofran ODT Take 1 tablet (4 mg total) by mouth every 8 (eight) hours as needed for up to 20 doses for nausea or vomiting.            Durable Medical Equipment  (From admission, onward)         Start     Ordered   05/24/19 1233  For home use only DME oxygen  Once    Question Answer Comment  Length of Need 6 Months   Mode or (Route) Nasal cannula   Liters per Minute 4   Frequency Continuous (stationary and portable oxygen unit needed)   Oxygen delivery system Gas      05/24/19 1232         Follow-up Information    [facility] Follow up.   Why: home oxygen        [facility] Follow up.   Why: Home Health         No Known Allergies  Time coordinating discharge: Over 30 minutes   SIGNED:   Harold Hedge, D.O. [facility] Pager: [phone]  05/24/2019, 3:13 PM

## 2019-05-24 NOTE — Progress Notes (Signed)
Patient discharge teaching given, including activity, diet, follow-up appoints, and medications. Patient verbalized understanding of all discharge instructions. IV access was d/c'd. Vitals are stable. Skin is intact except as charted in most recent assessments. 5 O2 tanks delivered to bedside and sent with patient.  Pt to be escorted out by NT, to be driven home by family.

## 2019-05-24 NOTE — Progress Notes (Signed)
SATURATION QUALIFICATIONS: (This note is used to comply with regulatory documentation for home oxygen)  Patient Saturations on Room Air at Rest = 90%  Patient Saturations on Room Air while Ambulating = 88%  Patient Saturations on 4 Liters of oxygen at Rest = 98%  Patient Saturations on 4 Liters of oxygen while Ambulating = 96%

## 2019-05-24 NOTE — TOC Transition Note (Addendum)
Transition of Care [facility]) - CM/SW Discharge Note   Patient Details  Name: Devin Castro MRN: [id number] Date of Birth: 18-Jun-1950  [facility] of [facility] CM/SW Contact:  Cherylann Parr, RN Phone Number: 05/24/2019, 1:03 PM   Clinical Narrative:   Pt will discharge home today.  CM informed both [facility] and [facility] that pt will discharge home today.  Pt confirms that home oxygen equipment has been delivered.  Per [facility]:  Portable oxygen tanks for transport delivered to the room on 1/4.  [facility] made aware that pt will only need 4 liters now.    CM requests bedside nurse to perform pulm ambulatory test today as required for pts insurance to cover oxygen   Final next level of care: Home w Home Health Services Barriers to Discharge: Barriers Resolved   Patient Goals and CMS Choice Patient states their goals for this hospitalization and ongoing recovery are:: Pt states he is ready to get home and enjoy the new year CMS Medicare.gov Compare Post Acute Care list provided to:: Patient Choice offered to / list presented to : Patient  Discharge Placement                       Discharge Plan and Services     Post Acute Care Choice: Home Health          DME Arranged: Oxygen DME Agency: [facility] Date DME Agency Contacted: 05/21/19 Time DME Agency Contacted: 1049 Representative spoke with at DME Agency: [facility] HH Arranged: PT HH Agency: [facility] Date HH Agency Contacted: 05/21/19 Time HH Agency Contacted: 1002 Representative spoke with at [facility] Agency: Lupita Leash  Social Determinants of Health (SDOH) Interventions     Readmission Risk Interventions No flowsheet data found.

## 2019-05-24 NOTE — Progress Notes (Signed)
Physical Therapy Treatment Patient Details Name: Devin Castro MRN: [id number] DOB: 07-14-1950 Today's Date: 05/24/2019    History of Present Illness 24 white male known history of COPD not on oxygen, HTN, status post bilateral knee replacement 06/09/2014, pulmonary embolism 2016, and hyperlipidemia. Admitted with pneumonia due to covid.    PT Comments    Pt is making good progress towards his goals. Pt able to ambulate 250 feet without AD on 4L O2 via [place] while maintaining SaO2 >89%O2. Pt hopeful for d/c home this afternoon. D/c plans remain appropriate.    Follow Up Recommendations  Home health PT;Supervision for mobility/OOB     Equipment Recommendations  None recommended by PT       Precautions / Restrictions Precautions Precautions: Other (comment);Fall Precaution Comments: watch sats Restrictions Weight Bearing Restrictions: No    Mobility  Bed Mobility                  Transfers Overall transfer level: Needs assistance Equipment used: None Transfers: Sit to/from Stand Sit to Stand: Min guard         General transfer comment: assist for lines/safety  Ambulation/Gait Ambulation/Gait assistance: Min guard;Supervision Gait Distance (Feet): 250 Feet Assistive device: None Gait Pattern/deviations: Step-through pattern;Decreased stride length Gait velocity: decreased Gait velocity interpretation: >2.62 ft/sec, indicative of community ambulatory General Gait Details: slightly unsteady initially however quickly progressed to supervision, pt with chronic lack of knee extension in R LE causing circumduction on L to advance LE          Balance Overall balance assessment: Needs assistance Sitting-balance support: No upper extremity supported;Feet supported Sitting balance-Leahy Scale: Good     Standing balance support: No upper extremity supported;During functional activity Standing balance-Leahy Scale: Fair                               Cognition Arousal/Alertness: Awake/alert Behavior During Therapy: WFL for tasks assessed/performed Overall Cognitive Status: Within Functional Limits for tasks assessed                                           General Comments General comments (skin integrity, edema, etc.): on 6L O2 on entry with SaO2 96%O2 able to ambulate on 4L O2 and maintain SaO2 >89%O2       Pertinent Vitals/Pain Pain Assessment: No/denies pain           PT Goals (current goals can now be found in the care plan section) Acute Rehab PT Goals Patient Stated Goal: home PT Goal Formulation: With patient Time For Goal Achievement: 06/02/19 Potential to Achieve Goals: Good Progress towards PT goals: Progressing toward goals    Frequency    Min 3X/week      PT Plan Current plan remains appropriate    M-PAC PT "6 Clicks" Mobility   Outcome Measure  Help needed turning from your back to your side while in a flat bed without using bedrails?: None Help needed moving from lying on your back to sitting on the side of a flat bed without using bedrails?: None Help needed moving to and from a bed to a chair (including a wheelchair)?: A Little Help needed standing up from a chair using your arms (e.g., wheelchair or bedside chair)?: A Little Help needed to walk in hospital room?: A Little Help needed climbing 3-5 steps  with a railing? : A Little 6 Click Score: 20    End of Session Equipment Utilized During Treatment: Oxygen;Gait belt Activity Tolerance: Patient tolerated treatment well Patient left: in bed;with call bell/phone within reach   PT Visit Diagnosis: Difficulty in walking, not elsewhere classified (R26.2);Muscle weakness (generalized) (M62.81)     Time: 5053-9767 PT Time Calculation (min) (ACUTE ONLY): 23 min  Charges:  $Gait Training: 23-37 mins                     Devin Castro PT, DPT Acute Rehabilitation Services Pager [phone] Office [phone]    Devin Castro 05/24/2019, 4:32 PM

## 2019-05-25 DIAGNOSIS — Z6828 Body mass index (BMI) 28.0-28.9, adult: Secondary | ICD-10-CM | POA: Diagnosis not present

## 2019-05-25 DIAGNOSIS — Z86718 Personal history of other venous thrombosis and embolism: Secondary | ICD-10-CM | POA: Diagnosis not present

## 2019-05-25 DIAGNOSIS — J9601 Acute respiratory failure with hypoxia: Secondary | ICD-10-CM | POA: Diagnosis not present

## 2019-05-25 DIAGNOSIS — J1282 Pneumonia due to coronavirus disease 2019: Secondary | ICD-10-CM | POA: Diagnosis not present

## 2019-05-25 DIAGNOSIS — E785 Hyperlipidemia, unspecified: Secondary | ICD-10-CM | POA: Diagnosis not present

## 2019-05-25 DIAGNOSIS — I10 Essential (primary) hypertension: Secondary | ICD-10-CM | POA: Diagnosis not present

## 2019-05-25 DIAGNOSIS — Z87442 Personal history of urinary calculi: Secondary | ICD-10-CM | POA: Diagnosis not present

## 2019-05-25 DIAGNOSIS — E668 Other obesity: Secondary | ICD-10-CM | POA: Diagnosis not present

## 2019-05-25 DIAGNOSIS — J440 Chronic obstructive pulmonary disease with acute lower respiratory infection: Secondary | ICD-10-CM | POA: Diagnosis not present

## 2019-05-25 DIAGNOSIS — Z96653 Presence of artificial knee joint, bilateral: Secondary | ICD-10-CM | POA: Diagnosis not present

## 2019-05-25 DIAGNOSIS — Z9981 Dependence on supplemental oxygen: Secondary | ICD-10-CM | POA: Diagnosis not present

## 2019-05-25 DIAGNOSIS — U071 COVID-19: Secondary | ICD-10-CM | POA: Diagnosis not present

## 2019-05-25 DIAGNOSIS — M1990 Unspecified osteoarthritis, unspecified site: Secondary | ICD-10-CM | POA: Diagnosis not present

## 2019-05-31 DIAGNOSIS — J1282 Pneumonia due to coronavirus disease 2019: Secondary | ICD-10-CM | POA: Diagnosis not present

## 2019-05-31 DIAGNOSIS — J9601 Acute respiratory failure with hypoxia: Secondary | ICD-10-CM | POA: Diagnosis not present

## 2019-05-31 DIAGNOSIS — Z8616 Personal history of COVID-19: Secondary | ICD-10-CM | POA: Diagnosis not present

## 2019-05-31 DIAGNOSIS — U071 COVID-19: Secondary | ICD-10-CM | POA: Diagnosis not present

## 2019-05-31 DIAGNOSIS — Z09 Encounter for follow-up examination after completed treatment for conditions other than malignant neoplasm: Secondary | ICD-10-CM | POA: Diagnosis not present

## 2019-06-05 ENCOUNTER — Other Ambulatory Visit: Payer: Self-pay | Admitting: Internal Medicine

## 2019-06-05 DIAGNOSIS — U071 COVID-19: Secondary | ICD-10-CM

## 2019-07-10 DIAGNOSIS — J449 Chronic obstructive pulmonary disease, unspecified: Secondary | ICD-10-CM | POA: Diagnosis not present

## 2019-07-10 DIAGNOSIS — U071 COVID-19: Secondary | ICD-10-CM | POA: Diagnosis not present

## 2019-07-10 DIAGNOSIS — I10 Essential (primary) hypertension: Secondary | ICD-10-CM | POA: Diagnosis not present

## 2019-07-10 DIAGNOSIS — J1282 Pneumonia due to coronavirus disease 2019: Secondary | ICD-10-CM | POA: Diagnosis not present

## 2019-10-08 DIAGNOSIS — R7301 Impaired fasting glucose: Secondary | ICD-10-CM | POA: Diagnosis not present

## 2019-10-08 DIAGNOSIS — E663 Overweight: Secondary | ICD-10-CM | POA: Diagnosis not present

## 2019-10-08 DIAGNOSIS — J449 Chronic obstructive pulmonary disease, unspecified: Secondary | ICD-10-CM | POA: Diagnosis not present

## 2019-10-08 DIAGNOSIS — E785 Hyperlipidemia, unspecified: Secondary | ICD-10-CM | POA: Diagnosis not present

## 2019-10-08 DIAGNOSIS — I10 Essential (primary) hypertension: Secondary | ICD-10-CM | POA: Diagnosis not present

## 2019-10-08 DIAGNOSIS — I2699 Other pulmonary embolism without acute cor pulmonale: Secondary | ICD-10-CM | POA: Diagnosis not present

## 2019-10-08 DIAGNOSIS — U071 COVID-19: Secondary | ICD-10-CM | POA: Diagnosis not present

## 2019-10-08 DIAGNOSIS — J1282 Pneumonia due to coronavirus disease 2019: Secondary | ICD-10-CM | POA: Diagnosis not present

## 2019-10-08 DIAGNOSIS — L299 Pruritus, unspecified: Secondary | ICD-10-CM | POA: Diagnosis not present

## 2019-10-08 DIAGNOSIS — E1169 Type 2 diabetes mellitus with other specified complication: Secondary | ICD-10-CM | POA: Diagnosis not present

## 2020-01-08 DIAGNOSIS — J441 Chronic obstructive pulmonary disease with (acute) exacerbation: Secondary | ICD-10-CM | POA: Diagnosis not present

## 2020-01-08 DIAGNOSIS — R3121 Asymptomatic microscopic hematuria: Secondary | ICD-10-CM | POA: Diagnosis not present

## 2020-01-08 DIAGNOSIS — R7301 Impaired fasting glucose: Secondary | ICD-10-CM | POA: Diagnosis not present

## 2020-01-08 DIAGNOSIS — E6609 Other obesity due to excess calories: Secondary | ICD-10-CM | POA: Diagnosis not present

## 2020-01-08 DIAGNOSIS — E7800 Pure hypercholesterolemia, unspecified: Secondary | ICD-10-CM | POA: Diagnosis not present

## 2020-01-08 DIAGNOSIS — Z6830 Body mass index (BMI) 30.0-30.9, adult: Secondary | ICD-10-CM | POA: Diagnosis not present

## 2020-01-08 DIAGNOSIS — I10 Essential (primary) hypertension: Secondary | ICD-10-CM | POA: Diagnosis not present

## 2020-01-16 DIAGNOSIS — R319 Hematuria, unspecified: Secondary | ICD-10-CM | POA: Diagnosis not present

## 2020-01-16 DIAGNOSIS — R3121 Asymptomatic microscopic hematuria: Secondary | ICD-10-CM | POA: Diagnosis not present

## 2020-04-22 DIAGNOSIS — R3121 Asymptomatic microscopic hematuria: Secondary | ICD-10-CM | POA: Diagnosis not present

## 2020-04-22 DIAGNOSIS — Z87442 Personal history of urinary calculi: Secondary | ICD-10-CM | POA: Diagnosis not present

## 2020-04-22 DIAGNOSIS — R350 Frequency of micturition: Secondary | ICD-10-CM | POA: Diagnosis not present

## 2020-05-27 DIAGNOSIS — R3121 Asymptomatic microscopic hematuria: Secondary | ICD-10-CM | POA: Diagnosis not present

## 2020-05-27 DIAGNOSIS — K4090 Unilateral inguinal hernia, without obstruction or gangrene, not specified as recurrent: Secondary | ICD-10-CM | POA: Diagnosis not present

## 2020-05-27 DIAGNOSIS — N281 Cyst of kidney, acquired: Secondary | ICD-10-CM | POA: Diagnosis not present

## 2020-05-27 DIAGNOSIS — K8020 Calculus of gallbladder without cholecystitis without obstruction: Secondary | ICD-10-CM | POA: Diagnosis not present

## 2020-05-27 DIAGNOSIS — N200 Calculus of kidney: Secondary | ICD-10-CM | POA: Diagnosis not present

## 2020-06-17 DIAGNOSIS — R3129 Other microscopic hematuria: Secondary | ICD-10-CM | POA: Diagnosis not present

## 2020-06-17 DIAGNOSIS — Z86711 Personal history of pulmonary embolism: Secondary | ICD-10-CM | POA: Diagnosis not present

## 2020-06-17 DIAGNOSIS — I10 Essential (primary) hypertension: Secondary | ICD-10-CM | POA: Diagnosis not present

## 2020-06-17 DIAGNOSIS — Z87891 Personal history of nicotine dependence: Secondary | ICD-10-CM | POA: Diagnosis not present

## 2020-06-17 DIAGNOSIS — L853 Xerosis cutis: Secondary | ICD-10-CM | POA: Diagnosis not present

## 2020-06-25 DIAGNOSIS — H35033 Hypertensive retinopathy, bilateral: Secondary | ICD-10-CM | POA: Diagnosis not present

## 2020-06-25 DIAGNOSIS — H2513 Age-related nuclear cataract, bilateral: Secondary | ICD-10-CM | POA: Diagnosis not present

## 2020-06-25 DIAGNOSIS — H35371 Puckering of macula, right eye: Secondary | ICD-10-CM | POA: Diagnosis not present

## 2020-06-25 DIAGNOSIS — H11442 Conjunctival cysts, left eye: Secondary | ICD-10-CM | POA: Diagnosis not present

## 2020-06-25 DIAGNOSIS — H524 Presbyopia: Secondary | ICD-10-CM | POA: Diagnosis not present

## 2020-07-29 DIAGNOSIS — I10 Essential (primary) hypertension: Secondary | ICD-10-CM | POA: Diagnosis not present

## 2020-07-29 DIAGNOSIS — L853 Xerosis cutis: Secondary | ICD-10-CM | POA: Diagnosis not present

## 2020-08-20 DIAGNOSIS — L299 Pruritus, unspecified: Secondary | ICD-10-CM | POA: Diagnosis not present

## 2020-08-28 DIAGNOSIS — L298 Other pruritus: Secondary | ICD-10-CM | POA: Diagnosis not present

## 2020-10-06 DIAGNOSIS — R21 Rash and other nonspecific skin eruption: Secondary | ICD-10-CM | POA: Diagnosis not present

## 2020-10-06 DIAGNOSIS — L723 Sebaceous cyst: Secondary | ICD-10-CM | POA: Diagnosis not present

## 2021-01-20 DIAGNOSIS — Z125 Encounter for screening for malignant neoplasm of prostate: Secondary | ICD-10-CM | POA: Diagnosis not present

## 2021-01-20 DIAGNOSIS — R3129 Other microscopic hematuria: Secondary | ICD-10-CM | POA: Diagnosis not present

## 2021-01-20 DIAGNOSIS — Z0000 Encounter for general adult medical examination without abnormal findings: Secondary | ICD-10-CM | POA: Diagnosis not present

## 2021-01-20 DIAGNOSIS — I10 Essential (primary) hypertension: Secondary | ICD-10-CM | POA: Diagnosis not present

## 2021-02-10 DIAGNOSIS — I10 Essential (primary) hypertension: Secondary | ICD-10-CM | POA: Diagnosis not present

## 2021-02-10 DIAGNOSIS — R3129 Other microscopic hematuria: Secondary | ICD-10-CM | POA: Diagnosis not present

## 2021-02-10 DIAGNOSIS — D6851 Activated protein C resistance: Secondary | ICD-10-CM | POA: Diagnosis not present

## 2021-03-09 IMAGING — DX DG CHEST 1V PORT
1 series · 1 of 1 positions shown · non-contrast
Comparison: 05/16/2019 and 05/14/2019 CTA chest.

CLINICAL DATA: Follow-up of pneumonia.

EXAM:
PORTABLE CHEST 1 VIEW

[chest]
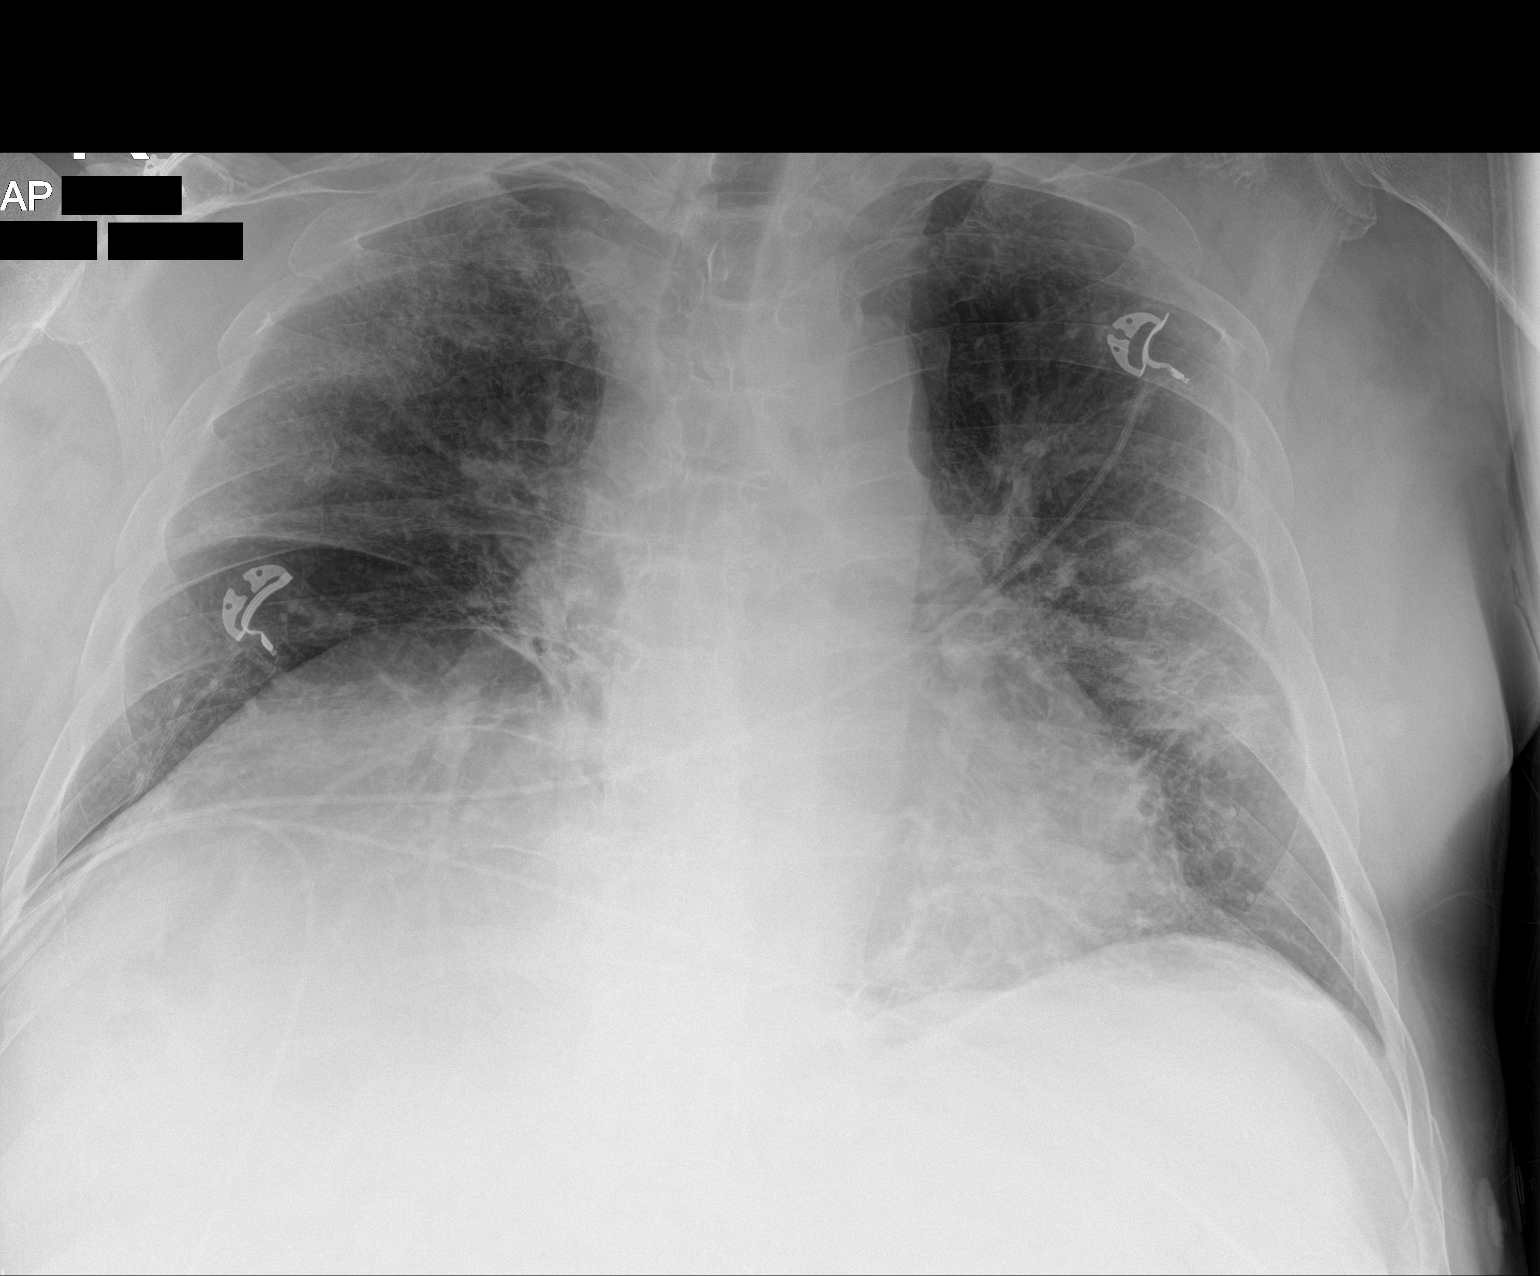

[1 of 1 positions shown; findings below may reference images not displayed]

FINDINGS: Midline trachea. Normal heart size. Right hemidiaphragm elevation is
moderate. No pleural effusion or pneumothorax. Bilateral, peripheral
predominant interstitial opacities are similar.
IMPRESSION: No significant change in bilateral peripheral predominant
interstitial opacities which are highly suspicious for [id number]
pneumonia.

## 2021-03-14 IMAGING — CT CT ANGIO CHEST
2 of 6 series · 17 of 46 positions shown · IV contrast (omnipaque)
Comparison: Portable chest yesterday. Chest CTA 05/14/2019.

CLINICAL DATA: 68-year-old male positive [id number]. Shortness of
breath.

EXAM:
CT ANGIOGRAPHY CHEST WITH CONTRAST
TECHNIQUE: Multidetector CT imaging of the chest was performed using the
standard protocol during bolus administration of intravenous
contrast. Multiplanar CT image reconstructions and MIPs were
obtained to evaluate the vascular anatomy.
CONTRAST:  100mL OMNIPAQUE IOHEXOL 350 MG/ML SOLN

[Series 6: thins · axial · 0.78mm/px · z∈[+1115,+1317]mm · 14 of 222 slices shown]
[im 10/222  lung]
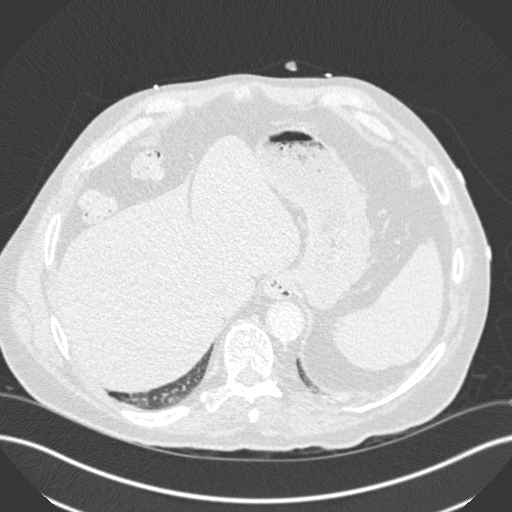
[im 29/222  soft-tissue]
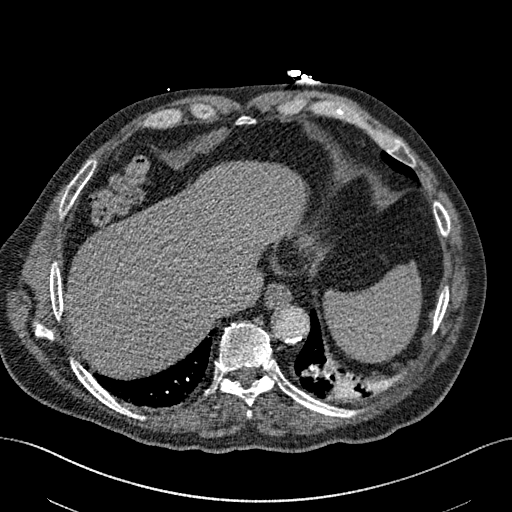
[im 39/222  lung]
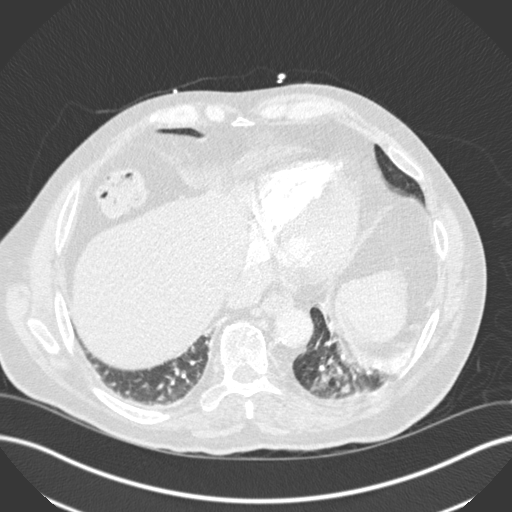
[im 58/222  soft-tissue]
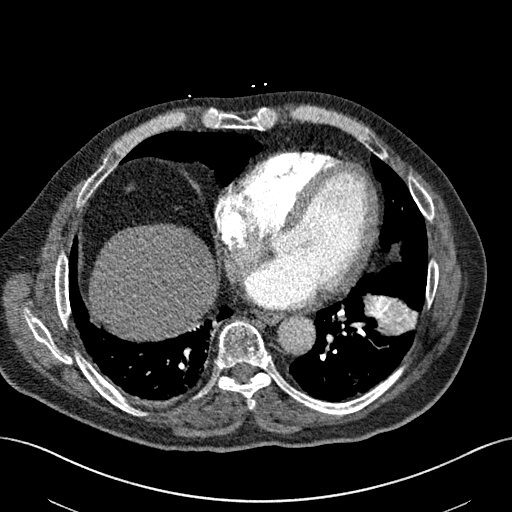
[im 77/222  lung]
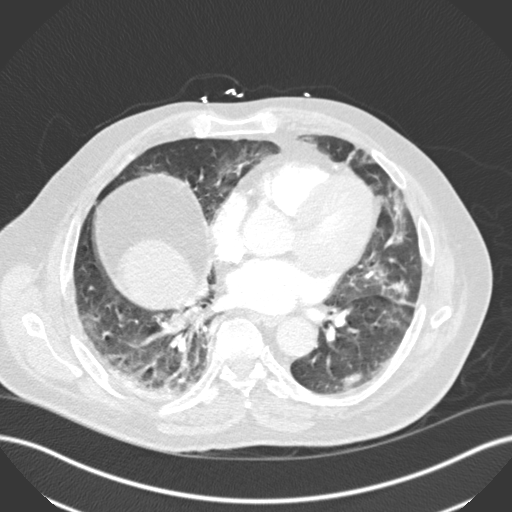
[im 87/222  soft-tissue]
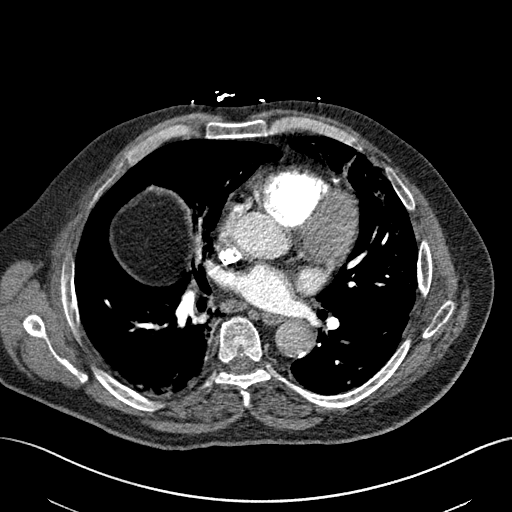
[im 106/222  lung]
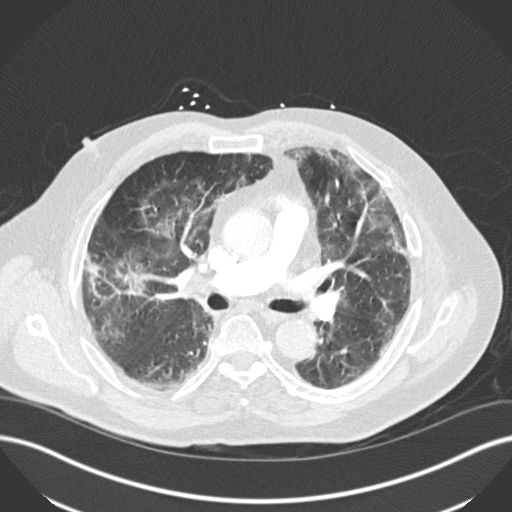
[im 116/222  soft-tissue]
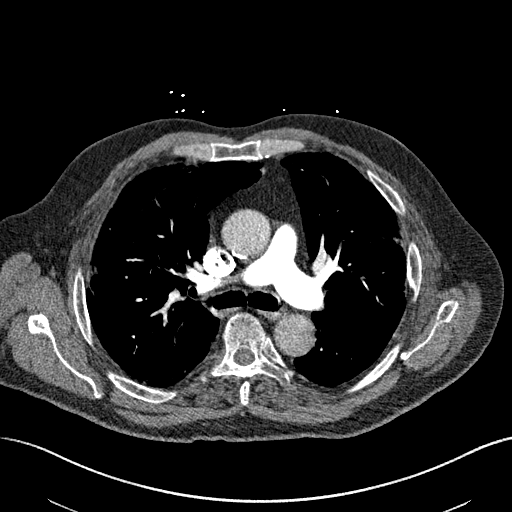
[im 135/222  lung]
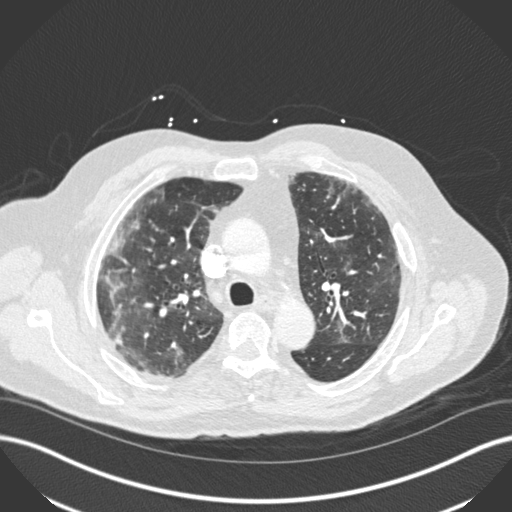
[im 145/222  soft-tissue]
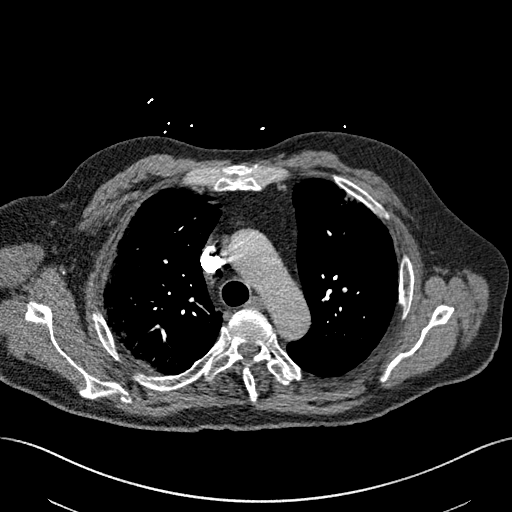
[im 164/222  lung]
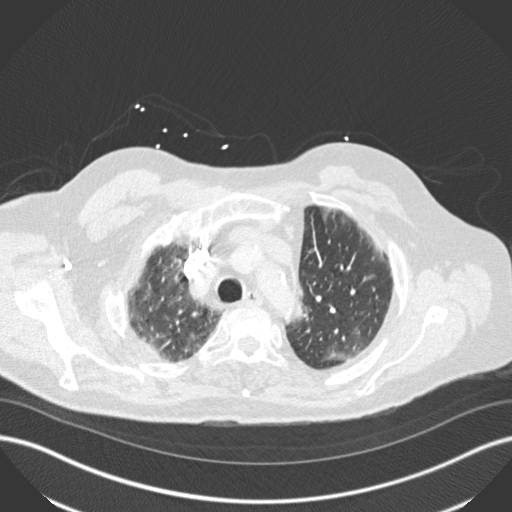
[im 183/222  soft-tissue]
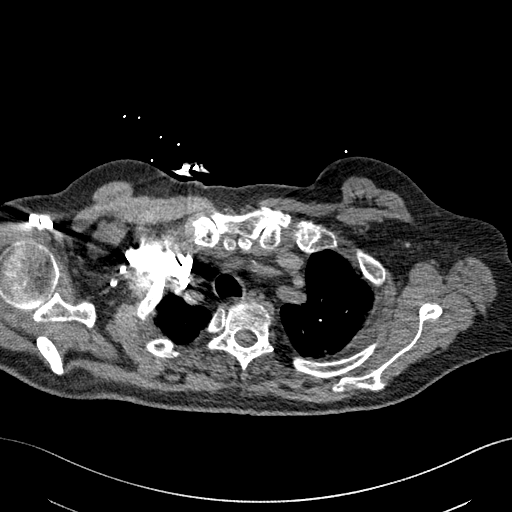
[im 193/222  lung]
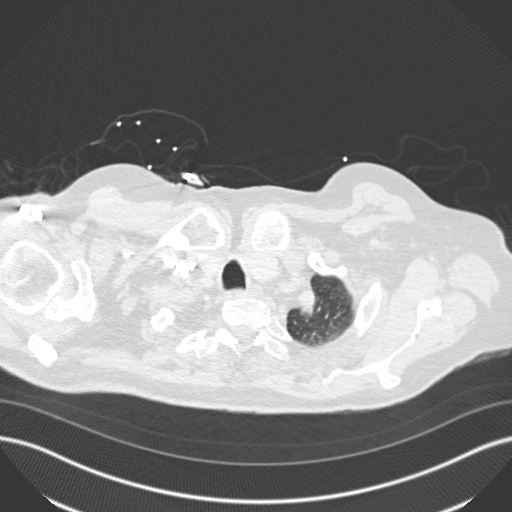
[im 212/222  soft-tissue]
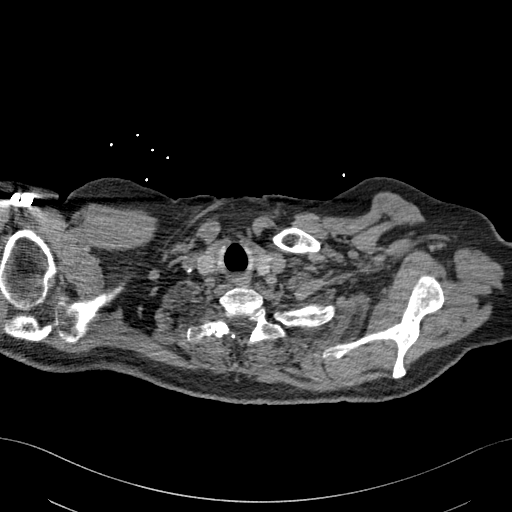

[Series 8: coronal mpr · coronal · 0.46mm/px · 3 of 151 slices shown]
[im 38/151  soft-tissue]
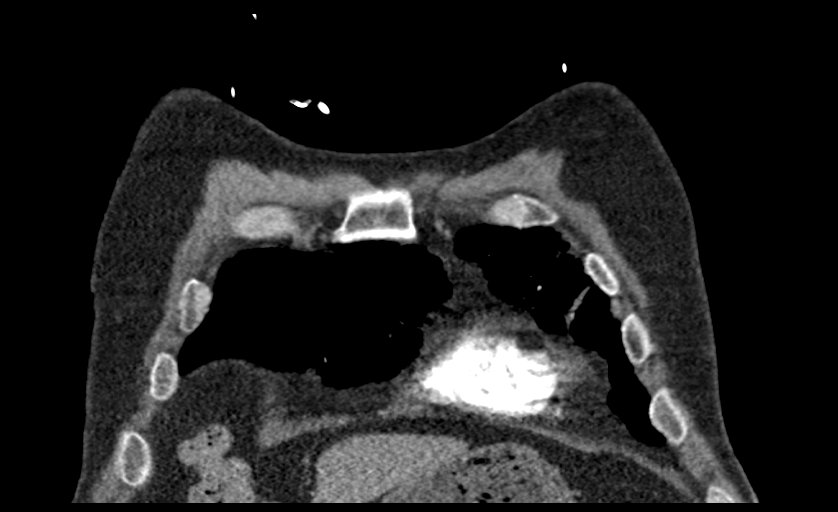
[im 76/151  soft-tissue]
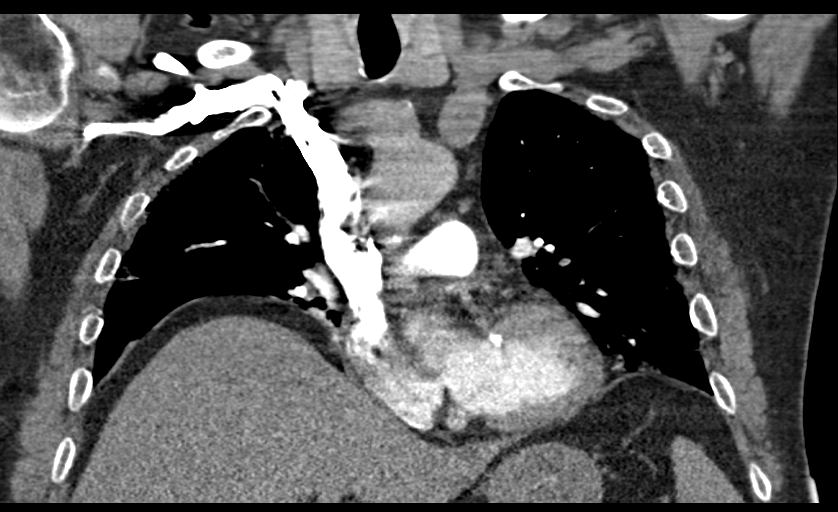
[im 113/151  soft-tissue]
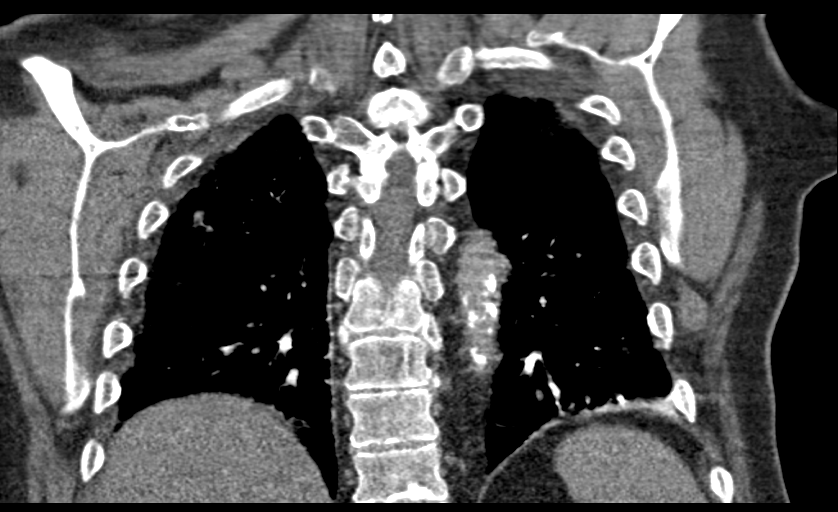

[17 of 46 positions shown; findings below may reference images not displayed]

FINDINGS: Cardiovascular: Good contrast bolus timing in the pulmonary arterial
tree. Mild respiratory motion.

No focal filling defect identified in the pulmonary arteries to
suggest acute pulmonary embolism.

Calcified coronary artery atherosclerosis (series 6, image 125).
Calcified aortic atherosclerosis. Little contrast in the aorta
today. Stable cardiac size at the upper limits of normal. No
pericardial effusion.

Mediastinum/Nodes: Negative. No lymphadenopathy. Hilar lymph nodes
appear decreased since last month.

Lungs/Pleura: Major airways remain patent. Stable lung volumes.
Chronic elevation of the right hemidiaphragm.

Continued peripheral patchy and confluent ground-glass opacity in
both lungs, having a vault somewhat from the Boclat CT and no
longer with the classic crazy paving type appearance. Increased
anterior basal segment left lower lobe opacity which most resembles
consolidation morphology but does enhance somewhat. Increased left
costophrenic angle opacity. As before, no associated pleural
effusion.

Overall, ventilation has not improved from the prior CTA.

Upper Abdomen: Negative visible liver, spleen and bowel in the upper
abdomen.

Musculoskeletal: No acute osseous abnormality identified. Occasional
benign vertebral hemangiomas in the thoracic spine.

Review of the MIP images confirms the above findings.
IMPRESSION: 1. No evidence of acute pulmonary embolus.
2. Continued widespread bilateral pulmonary ground-glass opacity
compatible with MUE7O-8K pneumonia not significantly improved from
the Boclat CTA. There is new consolidation or confluent
atelectasis in the left lower lobe.
3. No pleural effusion. Mediastinal and hilar lymph nodes have
regressed since [DATE]. Calcified coronary artery and Aortic atherosclerosis
([id number]).

## 2021-04-07 DIAGNOSIS — R310 Gross hematuria: Secondary | ICD-10-CM | POA: Diagnosis not present

## 2021-04-07 DIAGNOSIS — R350 Frequency of micturition: Secondary | ICD-10-CM | POA: Diagnosis not present

## 2021-04-20 DIAGNOSIS — J019 Acute sinusitis, unspecified: Secondary | ICD-10-CM | POA: Diagnosis not present

## 2021-04-20 DIAGNOSIS — M7022 Olecranon bursitis, left elbow: Secondary | ICD-10-CM | POA: Diagnosis not present

## 2021-04-20 DIAGNOSIS — M791 Myalgia, unspecified site: Secondary | ICD-10-CM | POA: Diagnosis not present

## 2021-04-20 DIAGNOSIS — R059 Cough, unspecified: Secondary | ICD-10-CM | POA: Diagnosis not present

## 2023-01-06 ENCOUNTER — Other Ambulatory Visit: Payer: Self-pay

## 2023-01-06 DIAGNOSIS — R609 Edema, unspecified: Secondary | ICD-10-CM

## 2023-01-18 ENCOUNTER — Ambulatory Visit (INDEPENDENT_AMBULATORY_CARE_PROVIDER_SITE_OTHER): Payer: PPO | Admitting: Physician Assistant

## 2023-01-18 ENCOUNTER — Encounter: Payer: Self-pay | Admitting: Physician Assistant

## 2023-01-18 ENCOUNTER — Ambulatory Visit (HOSPITAL_COMMUNITY)
Admission: RE | Admit: 2023-01-18 | Discharge: 2023-01-18 | Disposition: A | Discharge status: Home / Self Care | Payer: PPO | Source: Ambulatory Visit | Attending: Vascular Surgery | Admitting: Vascular Surgery

## 2023-01-18 VITALS — BP 133/76 | HR 82 | Temp 98.1°F | Resp 18 | Ht 70.0 in | Wt 211.1 lb

## 2023-01-18 DIAGNOSIS — I781 Nevus, non-neoplastic: Secondary | ICD-10-CM

## 2023-01-18 DIAGNOSIS — R609 Edema, unspecified: Secondary | ICD-10-CM | POA: Insufficient documentation

## 2023-01-18 NOTE — Progress Notes (Signed)
[facility] [place]     History of Present Illness  Devin Castro is a 72 y.o. male who presents with chief complaint.  He has a few prominent  telangectasia over the anterior shins.  The patient has had no history of DVT, he did have a very remote history of PE  in 2016 after B knee replacements.   He has no history of varicose vein,  history of venous stasis ulcers, no history of  Lymphedema and no history of skin changes in lower legs.  There is unknown family history of venous disorders.  The patient has not used compression stockings in the past.  He was told he has factor V Leiden and he was concerned that he would have bleeding issue if he cause trauma to his legs.  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  He has no history of injury with prolonged bleeding.  Past Medical History:  Diagnosis Date   Arthritis    COPD (chronic obstructive pulmonary disease) (HCC)    Hypertension    Kidney stone    PE (pulmonary embolism)    Shortness of breath dyspnea    Vertigo     Past Surgical History:  Procedure Laterality Date   CARDIAC CATHETERIZATION     COLONOSCOPY     kidney stone removal     TOTAL KNEE ARTHROPLASTY Bilateral 06/04/2014   Procedure: TOTAL KNEE BILATERAL;  Surgeon: Cammy Copa, MD;  Location: [facility];  Service: Orthopedics;  Laterality: Bilateral;    Social History   Socioeconomic History   Marital status: Married    Spouse name: Not on file   Number of children: Not on file   Years of education: Not on file   Highest education level: Not on file  Occupational History   Not on file  Tobacco Use   Smoking status: Former    Current packs/day: 0.00    Types: Cigarettes    Start date: 08/16/1982    Quit date: 08/16/2002    Years since quitting: 20.4   Smokeless tobacco: Former  Substance and Sexual Activity   Alcohol use: No   Drug use: No   Sexual activity: Yes    Birth control/protection: None  Other  Topics Concern   Not on file  Social History Narrative   Not on file   Social Determinants of Health   Financial Resource Strain: Low Risk  (11/22/2021)   Received from [facility] visits prior to 07/17/2022[facility]   Overall Financial Resource Strain (CARDIA)    Difficulty of Paying Living Expenses: Not hard at all  Food Insecurity: No Food Insecurity (05/25/2022)   [facility] visits prior to 07/17/2022., [facility]   Hunger Vital Sign    Worried About Running Out of Food in the Last Year: Never true    Ran Out of Food in the Last Year: Never true  Transportation Needs: No Transportation Needs (11/22/2021)   Received from [facility] visits prior to 07/17/2022., [facility]   PRAPARE - Transportation    Lack of Transportation (Medical): No    Lack of Transportation (Non-Medical): No  Physical Activity: Sufficiently Active (11/22/2021)   [facility] prior to 07/17/2022[facility]   Exercise Vital Sign    Days of Exercise per Week: 7 days    Minutes of  Exercise per Session: 30 min  Stress: No Stress Concern Present (11/22/2021)   [facility] prior to 07/17/2022., [facility]   [employer] of Occupational Health - Occupational Stress Questionnaire    Feeling of Stress : Not at all  Social Connections: Moderately Integrated (11/22/2021)   [facility] visits prior to 07/17/2022., [facility]   Social Connection and Isolation Panel [NHANES]    Frequency of Communication with Friends and Family: More than three times a week    Frequency of Social Gatherings with Friends and Family: More than three times a week    Attends Religious Services: 1 to 4 times per year    Active Member of [employer] or Organizations: No    Attends Banker Meetings: Never    Marital Status: Married   Catering manager Violence: Unknown (08/19/2021)   Received from [facility]   HITS    Physically Hurt: Not on file    Insult or Talk Down To: Not on file    Threaten Physical Harm: Not on file    Scream or Curse: Not on file    Family History  Problem Relation Age of Onset   Cancer Mother    Diabetes Father    Cancer Sister     Current Outpatient Medications on File Prior to Visit  Medication Sig Dispense Refill   amLODipine (NORVASC) 10 MG tablet Take 10 mg by mouth every evening.      losartan (COZAAR) 100 MG tablet Take 100 mg by mouth every evening.      dexamethasone (DECADRON) 6 MG tablet Take 1 tablet (6 mg total) by mouth daily. (Patient not taking: Reported on 01/18/2023) 1 tablet 0   Ipratropium-Albuterol (COMBIVENT) 20-100 MCG/ACT AERS respimat Inhale 1 puff into the lungs every 6 (six) hours as needed for wheezing or shortness of breath. (Patient not taking: Reported on 01/18/2023) 4 g 0   ondansetron (ZOFRAN ODT) 4 MG disintegrating tablet Take 1 tablet (4 mg total) by mouth every 8 (eight) hours as needed for up to 20 doses for nausea or vomiting. (Patient not taking: Reported on 05/17/2019) 20 tablet 0   No current facility-administered medications on file prior to visit.    Allergies as of 01/18/2023   (No Known Allergies)     ROS:   General:  No weight loss, Fever, chills  HEENT: No recent headaches, no nasal bleeding, no visual changes, no sore throat  Neurologic: No dizziness, blackouts, seizures. No recent symptoms of stroke or mini- stroke. No recent episodes of slurred speech, or temporary blindness.  Cardiac: No recent episodes of chest pain/pressure, no shortness of breath at rest.  No shortness of breath with exertion.  Denies history of atrial fibrillation or irregular heartbeat  Vascular: No history of rest pain in feet.  No history of claudication.  No history of non-healing ulcer, No history of DVT remote history of PE  Pulmonary: No home oxygen,  no productive cough, no hemoptysis,  No asthma or wheezing  Musculoskeletal:  [ x] Arthritis, [ ]  Low back pain,  [ ]  Joint pain  Hematologic:No history of hypercoagulable state.  No history of easy bleeding.  No history of anemia  Gastrointestinal: No hematochezia or melena,  No gastroesophageal reflux, no trouble swallowing  Urinary: [ ]  chronic Kidney disease, [ ]  on HD - [ ]  MWF or [ ]  TTHS, [ ]  Burning with urination, [ ]  Frequent urination, [ ]   Difficulty urinating;   Skin: No rashes  Psychological: No history of anxiety,  No history of depression  Physical Examination  Vitals:   01/18/23 1402  BP: 133/76  Pulse: 82  Resp: 18  Temp: 98.1 F (36.7 C)  TempSrc: Temporal  SpO2: 95%  Weight: 211 lb 1.6 oz (95.8 kg)  Height: 5\' 10"  (1.778 m)    Body mass index is 30.29 kg/m.  General:  Alert and oriented, no acute distress HEENT: Normal Neck: No bruit or JVD Pulmonary: Clear to auscultation bilaterally Cardiac: Regular Rate and Rhythm without murmur Abdomen: Soft, non-tender, non-distended, no mass, no scars Skin: No rash, anterior small dark vein 0.3 cm thrombosed superficial telangectasia Extremity Pulses:   radial,  femoral, dorsalis pedis,  pulses bilaterally Musculoskeletal: No deformity or edema  Neurologic: Upper and lower extremity motor 5/5 and symmetric  DATA:  Venous Reflux Times  +------------------+---------+------+-----------+------------+--------+  RIGHT            Reflux NoRefluxReflux TimeDiameter cmsComments                              Yes                                   +------------------+---------+------+-----------+------------+--------+  CFV              no                                              +------------------+---------+------+-----------+------------+--------+  FV mid            no                                              +------------------+---------+------+-----------+------------+--------+   Popliteal        no                                              +------------------+---------+------+-----------+------------+--------+  GSV at SFJ                  yes    >500 ms      0.50              +------------------+---------+------+-----------+------------+--------+  GSV prox thigh    no                            0.26              +------------------+---------+------+-----------+------------+--------+  GSV mid thigh     no                            0.30              +------------------+---------+------+-----------+------------+--------+  GSV dist thigh  NWV       +------------------+---------+------+-----------+------------+--------+  GSV at knee                                             NWV       +------------------+---------+------+-----------+------------+--------+  SSV Pop Fossa     no                            0.32              +------------------+---------+------+-----------+------------+--------+  SSV prox calf     no                            0.30              +------------------+---------+------+-----------+------------+--------+  anterior accessoryno                            0.36              +------------------+---------+------+-----------+------------+--------+         Assessment/Plan: Right:  - No evidence of deep vein thrombosis from the common femoral through the  popliteal veins.  - No evidence of superficial venous thrombosis.  - The deep venous system is competent.  - The great and small saphenous veins are competent.   His vein study does not indicate any increased risk of venous bleeding.  I suggested he wear long pants when working on cars and 4 wheeling to protect his skin form trauma.  F/U PRN    Mosetta Pigeon PA-C [facility] [place] Office: [phone]  MD in clinic [place]

## 2023-04-23 ENCOUNTER — Inpatient Hospital Stay (HOSPITAL_COMMUNITY)
Admission: EM | Admit: 2023-04-23 | Discharge: 2023-05-05 | DRG: 871 | Disposition: A | Discharge status: Home / Self Care | Payer: PPO | Attending: Internal Medicine | Admitting: Internal Medicine

## 2023-04-23 ENCOUNTER — Emergency Department (HOSPITAL_COMMUNITY): Payer: PPO

## 2023-04-23 DIAGNOSIS — R6520 Severe sepsis without septic shock: Secondary | ICD-10-CM | POA: Diagnosis present

## 2023-04-23 DIAGNOSIS — N179 Acute kidney failure, unspecified: Secondary | ICD-10-CM | POA: Diagnosis present

## 2023-04-23 DIAGNOSIS — G4733 Obstructive sleep apnea (adult) (pediatric): Secondary | ICD-10-CM | POA: Diagnosis present

## 2023-04-23 DIAGNOSIS — R7401 Elevation of levels of liver transaminase levels: Secondary | ICD-10-CM | POA: Diagnosis present

## 2023-04-23 DIAGNOSIS — J9811 Atelectasis: Secondary | ICD-10-CM | POA: Diagnosis not present

## 2023-04-23 DIAGNOSIS — J9601 Acute respiratory failure with hypoxia: Secondary | ICD-10-CM | POA: Diagnosis present

## 2023-04-23 DIAGNOSIS — I10 Essential (primary) hypertension: Secondary | ICD-10-CM | POA: Diagnosis present

## 2023-04-23 DIAGNOSIS — D539 Nutritional anemia, unspecified: Secondary | ICD-10-CM | POA: Diagnosis present

## 2023-04-23 DIAGNOSIS — A419 Sepsis, unspecified organism: Secondary | ICD-10-CM | POA: Diagnosis not present

## 2023-04-23 DIAGNOSIS — J210 Acute bronchiolitis due to respiratory syncytial virus: Secondary | ICD-10-CM

## 2023-04-23 DIAGNOSIS — J159 Unspecified bacterial pneumonia: Secondary | ICD-10-CM | POA: Diagnosis present

## 2023-04-23 DIAGNOSIS — J449 Chronic obstructive pulmonary disease, unspecified: Secondary | ICD-10-CM | POA: Diagnosis not present

## 2023-04-23 DIAGNOSIS — Z87891 Personal history of nicotine dependence: Secondary | ICD-10-CM

## 2023-04-23 DIAGNOSIS — D75839 Thrombocytosis, unspecified: Secondary | ICD-10-CM | POA: Diagnosis present

## 2023-04-23 DIAGNOSIS — E871 Hypo-osmolality and hyponatremia: Secondary | ICD-10-CM | POA: Diagnosis present

## 2023-04-23 DIAGNOSIS — E8809 Other disorders of plasma-protein metabolism, not elsewhere classified: Secondary | ICD-10-CM | POA: Diagnosis present

## 2023-04-23 DIAGNOSIS — E66811 Obesity, class 1: Secondary | ICD-10-CM | POA: Diagnosis present

## 2023-04-23 DIAGNOSIS — Z86711 Personal history of pulmonary embolism: Secondary | ICD-10-CM | POA: Diagnosis not present

## 2023-04-23 DIAGNOSIS — J440 Chronic obstructive pulmonary disease with acute lower respiratory infection: Secondary | ICD-10-CM | POA: Diagnosis present

## 2023-04-23 DIAGNOSIS — J189 Pneumonia, unspecified organism: Secondary | ICD-10-CM | POA: Diagnosis not present

## 2023-04-23 DIAGNOSIS — E8721 Acute metabolic acidosis: Secondary | ICD-10-CM | POA: Diagnosis present

## 2023-04-23 DIAGNOSIS — Z79899 Other long term (current) drug therapy: Secondary | ICD-10-CM

## 2023-04-23 DIAGNOSIS — R0602 Shortness of breath: Secondary | ICD-10-CM | POA: Diagnosis present

## 2023-04-23 DIAGNOSIS — Z87442 Personal history of urinary calculi: Secondary | ICD-10-CM

## 2023-04-23 DIAGNOSIS — M1990 Unspecified osteoarthritis, unspecified site: Secondary | ICD-10-CM | POA: Diagnosis present

## 2023-04-23 DIAGNOSIS — Z96653 Presence of artificial knee joint, bilateral: Secondary | ICD-10-CM | POA: Diagnosis present

## 2023-04-23 DIAGNOSIS — E785 Hyperlipidemia, unspecified: Secondary | ICD-10-CM | POA: Diagnosis present

## 2023-04-23 DIAGNOSIS — Z1152 Encounter for screening for COVID-19: Secondary | ICD-10-CM | POA: Diagnosis not present

## 2023-04-23 DIAGNOSIS — J441 Chronic obstructive pulmonary disease with (acute) exacerbation: Secondary | ICD-10-CM | POA: Diagnosis present

## 2023-04-23 DIAGNOSIS — Z8616 Personal history of COVID-19: Secondary | ICD-10-CM

## 2023-04-23 DIAGNOSIS — J121 Respiratory syncytial virus pneumonia: Secondary | ICD-10-CM | POA: Diagnosis present

## 2023-04-23 DIAGNOSIS — Z6831 Body mass index (BMI) 31.0-31.9, adult: Secondary | ICD-10-CM

## 2023-04-23 DIAGNOSIS — R945 Abnormal results of liver function studies: Secondary | ICD-10-CM | POA: Diagnosis present

## 2023-04-23 LAB — I-STAT CG4 LACTIC ACID, ED
Lactic Acid, Venous: 2.1 mmol/L (ref 0.5–1.9)
Lactic Acid, Venous: 1.4 mmol/L (ref 0.5–1.9)

## 2023-04-23 LAB — RESP PANEL BY RT-PCR (RSV, FLU A&B, COVID)  RVPGX2
Influenza A by PCR: NEGATIVE
Influenza B by PCR: NEGATIVE
Resp Syncytial Virus by PCR: POSITIVE — AB
SARS Coronavirus 2 by RT PCR: NEGATIVE

## 2023-04-23 LAB — COMPREHENSIVE METABOLIC PANEL
ALT: 54 U/L — ABNORMAL HIGH (ref 0–44)
AST: 45 U/L — ABNORMAL HIGH (ref 15–41)
Albumin: 2.9 g/dL — ABNORMAL LOW (ref 3.5–5.0)
Alkaline Phosphatase: 71 U/L (ref 38–126)
Anion gap: 15 (ref 5–15)
BUN: 41 mg/dL — ABNORMAL HIGH (ref 8–23)
CO2: 21 mmol/L — ABNORMAL LOW (ref 22–32)
Calcium: 8.2 mg/dL — ABNORMAL LOW (ref 8.9–10.3)
Chloride: 95 mmol/L — ABNORMAL LOW (ref 98–111)
Creatinine, Ser: 1.73 mg/dL — ABNORMAL HIGH (ref 0.61–1.24)
GFR, Estimated: 42 mL/min — ABNORMAL LOW (ref >60)
Glucose, Bld: 147 mg/dL — ABNORMAL HIGH (ref 70–99)
Potassium: 4 mmol/L (ref 3.5–5.1)
Sodium: 131 mmol/L — ABNORMAL LOW (ref 135–145)
Total Bilirubin: 1.5 mg/dL — ABNORMAL HIGH (ref <1.2)
Total Protein: 8.2 g/dL — ABNORMAL HIGH (ref 6.5–8.1)

## 2023-04-23 LAB — CBC
HCT: 43.5 % (ref 39.0–52.0)
Hemoglobin: 15 g/dL (ref 13.0–17.0)
MCH: 33.8 pg (ref 26.0–34.0)
MCHC: 34.5 g/dL (ref 30.0–36.0)
MCV: 98 fL (ref 80.0–100.0)
Platelets: 422 10*3/uL — ABNORMAL HIGH (ref 150–400)
RBC: 4.44 MIL/uL (ref 4.22–5.81)
RDW: 13.1 % (ref 11.5–15.5)
WBC: 22.7 10*3/uL — ABNORMAL HIGH (ref 4.0–10.5)
nRBC: 0 % (ref 0.0–0.2)

## 2023-04-23 LAB — BLOOD GAS, VENOUS
Acid-base deficit: 0.6 mmol/L (ref 0.0–2.0)
Bicarbonate: 23.2 mmol/L (ref 20.0–28.0)
O2 Saturation: 52.8 %
Patient temperature: 36.1
pCO2, Ven: 34 mm[Hg] — ABNORMAL LOW (ref 44–60)
pH, Ven: 7.44 — ABNORMAL HIGH (ref 7.25–7.43)
pO2, Ven: 33 mm[Hg] (ref 32–45)

## 2023-04-23 MED ORDER — ACETAMINOPHEN 325 MG PO TABS: 650.0000 mg | ORAL_TABLET | Freq: Four times a day (QID) | ORAL | Status: DC | PRN

## 2023-04-23 MED ORDER — METHYLPREDNISOLONE SODIUM SUCC 40 MG IJ SOLR
40.0000 mg | Freq: Two times a day (BID) | INTRAMUSCULAR | Status: DC
Start: 2023-04-24 — End: 2023-04-27
  Administered 2023-04-24 – 2023-04-27 (×7): 40 mg via INTRAVENOUS
  Filled 2023-04-23 (×7): qty 1

## 2023-04-23 MED ORDER — SODIUM CHLORIDE 0.9 % IV BOLUS
1000.0000 mL | Freq: Once | INTRAVENOUS | Status: AC
  Administered 2023-04-23: 1000 mL via INTRAVENOUS

## 2023-04-23 MED ORDER — CEFTRIAXONE SODIUM 1 G IJ SOLR
1.0000 g | Freq: Once | INTRAMUSCULAR | Status: DC
  Filled 2023-04-23: qty 10

## 2023-04-23 MED ORDER — ACETAMINOPHEN 650 MG RE SUPP: 650.0000 mg | Freq: Four times a day (QID) | RECTAL | Status: DC | PRN

## 2023-04-23 MED ORDER — SODIUM CHLORIDE 0.9 % IV SOLN
500.0000 mg | INTRAVENOUS | Status: AC
  Administered 2023-04-23 – 2023-04-27 (×5): 500 mg via INTRAVENOUS
  Filled 2023-04-23 (×5): qty 5

## 2023-04-23 MED ORDER — ONDANSETRON HCL 4 MG/2ML IJ SOLN
4.0000 mg | Freq: Four times a day (QID) | INTRAMUSCULAR | Status: DC | PRN
  Administered 2023-04-26: 4 mg via INTRAVENOUS
  Filled 2023-04-23: qty 2

## 2023-04-23 MED ORDER — IPRATROPIUM-ALBUTEROL 0.5-2.5 (3) MG/3ML IN SOLN
3.0000 mL | Freq: Once | RESPIRATORY_TRACT | Status: AC
  Administered 2023-04-23: 3 mL via RESPIRATORY_TRACT

## 2023-04-23 MED ORDER — SODIUM CHLORIDE 0.9% FLUSH
3.0000 mL | Freq: Two times a day (BID) | INTRAVENOUS | Status: DC
  Administered 2023-04-24 – 2023-05-04 (×17): 3 mL via INTRAVENOUS

## 2023-04-23 MED ORDER — IPRATROPIUM-ALBUTEROL 0.5-2.5 (3) MG/3ML IN SOLN
RESPIRATORY_TRACT | Status: AC
  Administered 2023-04-23: 3 mL via RESPIRATORY_TRACT
  Filled 2023-04-23: qty 6

## 2023-04-23 MED ORDER — SENNOSIDES-DOCUSATE SODIUM 8.6-50 MG PO TABS: 1.0000 | ORAL_TABLET | Freq: Every evening | ORAL | Status: DC | PRN

## 2023-04-23 MED ORDER — LORAZEPAM 2 MG/ML IJ SOLN
0.5000 mg | Freq: Once | INTRAMUSCULAR | Status: AC
  Administered 2023-04-23: 0.5 mg via INTRAVENOUS
  Filled 2023-04-23: qty 1

## 2023-04-23 MED ORDER — SODIUM CHLORIDE 0.9% FLUSH
10.0000 mL | Freq: Two times a day (BID) | INTRAVENOUS | Status: DC
  Administered 2023-04-24 – 2023-04-28 (×10): 10 mL via INTRAVENOUS

## 2023-04-23 MED ORDER — BUDESONIDE 0.25 MG/2ML IN SUSP
0.2500 mg | Freq: Two times a day (BID) | RESPIRATORY_TRACT | Status: DC
  Administered 2023-04-23 – 2023-04-28 (×10): 0.25 mg via RESPIRATORY_TRACT
  Filled 2023-04-23 (×10): qty 2

## 2023-04-23 MED ORDER — IPRATROPIUM-ALBUTEROL 0.5-2.5 (3) MG/3ML IN SOLN
3.0000 mL | Freq: Four times a day (QID) | RESPIRATORY_TRACT | Status: DC | PRN
  Administered 2023-04-24: 3 mL via RESPIRATORY_TRACT
  Filled 2023-04-23: qty 3

## 2023-04-23 MED ORDER — IPRATROPIUM-ALBUTEROL 0.5-2.5 (3) MG/3ML IN SOLN
3.0000 mL | Freq: Once | RESPIRATORY_TRACT | Status: AC
  Administered 2023-04-23: 3 mL via RESPIRATORY_TRACT
  Filled 2023-04-23: qty 3

## 2023-04-23 MED ORDER — METHYLPREDNISOLONE SODIUM SUCC 125 MG IJ SOLR
125.0000 mg | Freq: Once | INTRAMUSCULAR | Status: AC
  Administered 2023-04-23: 125 mg via INTRAVENOUS
  Filled 2023-04-23: qty 2

## 2023-04-23 MED ORDER — ENOXAPARIN SODIUM 40 MG/0.4ML IJ SOSY
40.0000 mg | PREFILLED_SYRINGE | INTRAMUSCULAR | Status: DC
  Administered 2023-04-23 – 2023-05-04 (×12): 40 mg via SUBCUTANEOUS
  Filled 2023-04-23 (×12): qty 0.4

## 2023-04-23 MED ORDER — SODIUM CHLORIDE 0.9 % IV SOLN
1.0000 g | Freq: Once | INTRAVENOUS | Status: AC
  Administered 2023-04-23: 1 g via INTRAVENOUS

## 2023-04-23 MED ORDER — GUAIFENESIN ER 600 MG PO TB12
600.0000 mg | ORAL_TABLET | Freq: Two times a day (BID) | ORAL | Status: DC
  Administered 2023-04-23 – 2023-04-27 (×8): 600 mg via ORAL
  Filled 2023-04-23 (×9): qty 1

## 2023-04-23 MED ORDER — ONDANSETRON HCL 4 MG PO TABS: 4.0000 mg | ORAL_TABLET | Freq: Four times a day (QID) | ORAL | Status: DC | PRN

## 2023-04-23 MED ORDER — ARFORMOTEROL TARTRATE 15 MCG/2ML IN NEBU
15.0000 ug | INHALATION_SOLUTION | Freq: Two times a day (BID) | RESPIRATORY_TRACT | Status: DC
  Administered 2023-04-23 – 2023-05-03 (×20): 15 ug via RESPIRATORY_TRACT
  Filled 2023-04-23 (×20): qty 2

## 2023-04-23 MED ORDER — SODIUM CHLORIDE 0.9 % IV SOLN
2.0000 g | INTRAVENOUS | Status: AC
  Administered 2023-04-24 – 2023-04-28 (×5): 2 g via INTRAVENOUS
  Filled 2023-04-23 (×5): qty 20

## 2023-04-23 NOTE — ED Notes (Signed)
RT called for BIPAP

## 2023-04-23 NOTE — H&P (Signed)
History and Physical    Devin Castro ZHY:[id number] DOB: [birth year] DOA: 04/23/2023  PCP: Eartha Inch, MD  Patient coming from: Home  I have personally briefly reviewed patient's old medical records in [facility]  Chief Complaint: Shortness of breath  HPI: Devin Castro is a 72 y.o. male with medical history significant for COPD, Hx of provoked PE in 2016 completed 6 months Xarelto, HTN, HLD who presented to the ED for evaluation of shortness of breath.  Patient was recently on a 1 week long cruise with his spouse.  He had been feeling sick the entire trip and did not leave their room on a cruise ship.  He had been feeling fatigued with productive cough, shortness of breath, and congestion.  He was dyspneic with minimal exertion.  Symptoms have been worsening over the last 3 days.  He got off the cruise this morning and his wife drove them up from [facility] today and subsequently brought him to the ED due to significantly worsening shortness of breath.  Patient states symptoms have improved with initial treatment in the ED and use of BiPAP.  He denies any chest pain, nausea, vomiting, abdominal pain.  He reports good urine output.  ED Course  Labs/Imaging on admission: I have personally reviewed following labs and imaging studies.  Initial vitals showed BP 135/63, pulse 134, RR 38, temp 98.9 F, SpO2 80% on room air per ED documentation.  Patient placed on 15 L NRB with SpO2 93% and subsequently transitioned to BiPAP.  Labs showed WBC 22.7, hemoglobin 15.0, platelets 422,000, sodium 131, potassium 4.0, bicarb 21, BUN 41, creatinine 1.73 (0.85 on 02/14/2023), serum glucose 147, AST 45, ALT 54, alk phos 71, total bilirubin 1.5, lactic acid 2.1.  RSV PCR is positive.  SARS-CoV-2 and influenza PCR negative.  VBG showed pH 7.44, pCO2 34, pO2 33.  Blood cultures in process.  Formal chest x-ray showed patchy bilateral lower lobe airspace opacities.  Patient was given 3 L normal  saline**, IV Solu-Medrol 125 mg, IV ceftriaxone and azithromycin.  The hospitalist service was consulted to admit for further evaluation and management.  Review of Systems: All systems reviewed and are negative except as documented in history of present illness above.   Past Medical History:  Diagnosis Date   Arthritis    COPD (chronic obstructive pulmonary disease) (HCC)    Hypertension    Kidney stone    PE (pulmonary embolism)    Shortness of breath dyspnea    Vertigo     Past Surgical History:  Procedure Laterality Date   CARDIAC CATHETERIZATION     COLONOSCOPY     kidney stone removal     TOTAL KNEE ARTHROPLASTY Bilateral 06/04/2014   Procedure: TOTAL KNEE BILATERAL;  Surgeon: Cammy Copa, MD;  Location: [facility] OR;  Service: Orthopedics;  Laterality: Bilateral;    Social History:  reports that he quit smoking about 20 years ago. He started smoking about 40 years ago. He has quit using smokeless tobacco. He reports that he does not drink alcohol and does not use drugs.  No Known Allergies  Family History  Problem Relation Age of Onset   Cancer Mother    Diabetes Father    Cancer Sister      Prior to Admission medications   Medication Sig Start Date End Date Taking? Authorizing Provider  amLODipine (NORVASC) 10 MG tablet Take 10 mg by mouth every evening.  05/07/19   [provider]  dexamethasone (  DECADRON) 6 MG tablet Take 1 tablet (6 mg total) by mouth daily. Patient not taking: Reported on 01/18/2023 05/25/19   Jae Dire, MD  Ipratropium-Albuterol (COMBIVENT) 20-100 MCG/ACT AERS respimat Inhale 1 puff into the lungs every 6 (six) hours as needed for wheezing or shortness of breath. Patient not taking: Reported on 01/18/2023 05/24/19   Jae Dire, MD  losartan (COZAAR) 100 MG tablet Take 100 mg by mouth every evening.     [provider]  ondansetron (ZOFRAN ODT) 4 MG disintegrating tablet Take 1 tablet (4 mg total) by mouth every 8 (eight)  hours as needed for up to 20 doses for nausea or vomiting. Patient not taking: Reported on 05/17/2019 05/14/19   Terald Sleeper, MD    Physical Exam: Vitals:   04/23/23 2036 04/23/23 2045 04/23/23 2050 04/23/23 2057  BP: 127/73 120/77    Pulse: (!) 131 68 62   Resp: (!) 40 (!) 35 (!) 34   Temp:      TempSrc:      SpO2: 97% 95% 95%   Weight:    96 kg   Constitutional: Resting in bed while on BiPAP.  NAD, calm. Eyes: EOMI, lids and conjunctivae normal ENMT: Mucous membranes are moist. Posterior pharynx clear of any exudate or lesions.Normal dentition.  Neck: normal, supple, no masses. Respiratory: End expiratory wheezing bilaterally. Normal respiratory effort while on BiPAP. No accessory muscle use.  Cardiovascular: Tachycardic, no murmurs / rubs / gallops. No extremity edema. 2+ pedal pulses. Abdomen: no tenderness, no masses palpated.  Musculoskeletal: no clubbing / cyanosis. No joint deformity upper and lower extremities. Good ROM, no contractures. Normal muscle tone.  Skin: no rashes, lesions, ulcers. No induration Neurologic: Sensation intact. Strength 5/5 in all 4.  Psychiatric: Normal judgment and insight. Alert and oriented x 3. Normal mood.   EKG: Ordered and pending.  Assessment/Plan Principal Problem:   Severe sepsis (HCC) Active Problems:   COPD with acute exacerbation (HCC)   Community acquired bilateral lower lobe pneumonia   Acute respiratory failure with hypoxia (HCC)   AKI (acute kidney injury) (HCC)   Hypertension   Devin Castro is a 72 y.o. male with medical history significant for COPD, Hx of provoked PE in 2016 completed 6 months Xarelto, HTN, HLD who is admitted with severe sepsis and acute hypoxic respiratory failure due to bilateral lower lobe pneumonia and COPD exacerbation.  Assessment and Plan: Severe sepsis due to bilateral lower lobe pneumonia, POA: Presenting with leukocytosis, tachycardia, tachypnea, hypoxia and AKI in setting of bilateral  lower lobe pneumonia as seen on CXR.  RSV is positive and patient is being treated for potential secondary bacterial pneumonia. -Continue IV ceftriaxone and azithromycin -Follow blood cultures, strep pneumonia and Legionella urinary antigens -Patient receiving total 3 L IV fluids initiated in the ED  COPD with acute exacerbation: Triggered by RSV pneumonia.  Requiring BiPAP on admission and still with active wheezing. -Start Brovana/Pulmicort BID, DuoNebs as needed -IV Solu-Medrol 40 mg twice daily -Continue antibiotics as above -Continue BiPAP as needed and wean off as able -IS, FV, Mucinex  Acute respiratory failure with hypoxia: Secondary to pneumonia and COPD exacerbation.  SpO2 80% on RA on arrival, requiring BiPAP at time of admission. -Continue management as above -Continue BiPAP as needed, wean off as able  Acute kidney injury: Creatinine 1.73 on admission compared to previous 0.85 on 02/14/2023.  AKI secondary to severe sepsis.  He is receiving total 3 L IV fluids for management  of sepsis.  Hold losartan and will repeat labs in AM.  Hypertension: BP is stable.  Losartan on hold due to AKI.  Can resume amlodipine as warranted.   DVT prophylaxis: enoxaparin (LOVENOX) injection 40 mg Start: 04/23/23 2145 Code Status: Full code, confirmed with patient on admission Family Communication: Spouse at bedside Disposition Plan: From home and likely discharge to home pending clinical progress Consults called: None Severity of Illness: The appropriate patient status for this patient is INPATIENT. Inpatient status is judged to be reasonable and necessary in order to provide the required intensity of service to ensure the patient's safety. The patient's presenting symptoms, physical exam findings, and initial radiographic and laboratory data in the context of their chronic comorbidities is felt to place them at high risk for further clinical deterioration. Furthermore, it is not anticipated  that the patient will be medically stable for discharge from the hospital within 2 midnights of admission.   * I certify that at the point of admission it is my clinical judgment that the patient will require inpatient hospital care spanning beyond 2 midnights from the point of admission due to high intensity of service, high risk for further deterioration and high frequency of surveillance required.Darreld Mclean MD [facility]  If 7PM-7AM, please contact night-coverage [email or web address]  04/23/2023, 9:52 PM

## 2023-04-23 NOTE — Progress Notes (Signed)
   04/23/23 2036  BiPAP/CPAP/SIPAP  $ Non-Invasive Ventilator  Non-Invasive Vent Initial  $ Face Mask Medium Yes  BiPAP/CPAP/SIPAP Pt Type Adult  BiPAP/CPAP/SIPAP V60  Mask Type Full face mask  Mask Size Medium  Set Rate 16 breaths/min  Respiratory Rate 35 breaths/min  IPAP 18 cmH20  EPAP 8 cmH2O  FiO2 (%) 50 %  Minute Ventilation 29  Leak 1  Peak Inspiratory Pressure (PIP) 19  Tidal Volume (Vt) 795  Patient Home Equipment No  Auto Titrate No  Press High Alarm 35 cmH2O  Press Low Alarm 5 cmH2O  CPAP/SIPAP surface wiped down Yes  BiPAP/CPAP /SiPAP Vitals  Pulse Rate (!) 131  Resp (!) 40  BP 127/73  SpO2 97 %  Bilateral Breath Sounds Diminished;Expiratory wheezes  MEWS Score/Color  MEWS Score 6  MEWS Score Color Red   Pt placed on bipap due to wob. Pt is tolerating it well at this time.

## 2023-04-23 NOTE — ED Provider Notes (Signed)
[place] EMERGENCY DEPARTMENT AT [facility] Provider Note   CSN: [id number] Arrival date & time: 04/23/23  9528     History  Chief Complaint  Patient presents with   Shortness of Breath    Devin Castro is a 72 y.o. male patient with past medical history of COPD, pulmonary embolism, hypertension presenting to emergency room with 1 week of cough congestion and generally feeling unwell.  Symptoms have worsened throughout the last 3 days and he is experiencing significant shortness of breath.  Patient reports a productive cough.  Patient reports he was on a cruise for the past week and felt ill the entire time.  Patient just got home from cruise this morning, shortness of breath is very significant when he is ambulating.  Upon arrival he was 80% on room air.  Denies any fever, chills, NVD. Patient is not on blood thinner because prior PE was provoked from prior knee surgery.    Shortness of Breath      Home Medications Prior to Admission medications   Medication Sig Start Date End Date Taking? Authorizing Provider  amLODipine (NORVASC) 10 MG tablet Take 10 mg by mouth every evening.  05/07/19   [provider]  dexamethasone (DECADRON) 6 MG tablet Take 1 tablet (6 mg total) by mouth daily. Patient not taking: Reported on 01/18/2023 05/25/19   Jae Dire, MD  Ipratropium-Albuterol (COMBIVENT) 20-100 MCG/ACT AERS respimat Inhale 1 puff into the lungs every 6 (six) hours as needed for wheezing or shortness of breath. Patient not taking: Reported on 01/18/2023 05/24/19   Jae Dire, MD  losartan (COZAAR) 100 MG tablet Take 100 mg by mouth every evening.     [provider]  ondansetron (ZOFRAN ODT) 4 MG disintegrating tablet Take 1 tablet (4 mg total) by mouth every 8 (eight) hours as needed for up to 20 doses for nausea or vomiting. Patient not taking: Reported on 05/17/2019 05/14/19   Terald Sleeper, MD      Allergies    Patient has no known  allergies.    Review of Systems   Review of Systems  Respiratory:  Positive for shortness of breath.     Physical Exam Updated Vital Signs BP 138/79   Pulse (!) 127   Temp 98.9 F (37.2 C) (Oral)   Resp (!) 28   SpO2 92%  Physical Exam Vitals and nursing note reviewed.  Constitutional:      General: He is not in acute distress.    Appearance: He is ill-appearing. He is not toxic-appearing.  HENT:     Head: Normocephalic and atraumatic.  Eyes:     General: No scleral icterus.    Conjunctiva/sclera: Conjunctivae normal.  Cardiovascular:     Rate and Rhythm: Normal rate and regular rhythm.     Pulses: Normal pulses.     Heart sounds: Normal heart sounds.  Pulmonary:     Effort: Pulmonary effort is normal. No respiratory distress.     Breath sounds: Wheezing and rhonchi present.  Chest:     Chest wall: No tenderness.  Abdominal:     General: Abdomen is flat. Bowel sounds are normal.     Palpations: Abdomen is soft.     Tenderness: There is no abdominal tenderness.  Musculoskeletal:     Right lower leg: No edema.     Left lower leg: No edema.  Skin:    General: Skin is warm and dry.     Findings: No  lesion.  Neurological:     General: No focal deficit present.     Mental Status: He is alert and oriented to person, place, and time. Mental status is at baseline.     ED Results / Procedures / Treatments   Labs (all labs ordered are listed, but only abnormal results are displayed) Labs Reviewed  RESP PANEL BY RT-PCR (RSV, FLU A&B, COVID)  RVPGX2 - Abnormal; Notable for the following components:      Result Value   Resp Syncytial Virus by PCR POSITIVE (*)    All other components within normal limits  COMPREHENSIVE METABOLIC PANEL - Abnormal; Notable for the following components:   Sodium 131 (*)    Chloride 95 (*)    CO2 21 (*)    Glucose, Bld 147 (*)    BUN 41 (*)    Creatinine, Ser 1.73 (*)    Calcium 8.2 (*)    Total Protein 8.2 (*)    Albumin 2.9 (*)     AST 45 (*)    ALT 54 (*)    Total Bilirubin 1.5 (*)    GFR, Estimated 42 (*)    All other components within normal limits  CBC - Abnormal; Notable for the following components:   WBC 22.7 (*)    Platelets 422 (*)    All other components within normal limits  BLOOD GAS, VENOUS - Abnormal; Notable for the following components:   pH, Ven 7.44 (*)    pCO2, Ven 34 (*)    All other components within normal limits  I-STAT CG4 LACTIC ACID, ED - Abnormal; Notable for the following components:   Lactic Acid, Venous 2.1 (*)    All other components within normal limits  CULTURE, BLOOD (ROUTINE X 2)  CULTURE, BLOOD (ROUTINE X 2)    EKG None  Radiology DG Chest Port 1 View  Result Date: 04/23/2023 CLINICAL DATA:  Shortness of breath EXAM: PORTABLE CHEST 1 VIEW COMPARISON:  05/22/2019 FINDINGS: Heart and mediastinal contours are within normal limits. Patchy opacities in lower lungs bilaterally. No effusions or pneumothorax. No acute bony abnormality. IMPRESSION: Patchy bilateral lower lobe airspace opacities concerning for pneumonia. Electronically Signed   By: Charlett Nose M.D.   On: 04/23/2023 20:40    Procedures Procedures    Medications Ordered in ED Medications  sodium chloride flush (NS) 0.9 % injection 10 mL (has no administration in time range)  azithromycin (ZITHROMAX) 500 mg in sodium chloride 0.9 % 250 mL IVPB (500 mg Intravenous New Bag/Given 04/23/23 2032)  sodium chloride 0.9 % bolus 1,000 mL (has no administration in time range)  sodium chloride 0.9 % bolus 1,000 mL (has no administration in time range)  ipratropium-albuterol (DUONEB) 0.5-2.5 (3) MG/3ML nebulizer solution 3 mL (3 mLs Nebulization Given 04/23/23 1951)  methylPREDNISolone sodium succinate (SOLU-MEDROL) 125 mg/2 mL injection 125 mg (125 mg Intravenous Given 04/23/23 1954)  sodium chloride 0.9 % bolus 1,000 mL (1,000 mLs Intravenous New Bag/Given 04/23/23 2015)  cefTRIAXone (ROCEPHIN) 1 g in sodium chloride 0.9 % 100  mL IVPB (0 g Intravenous Stopped 04/23/23 2031)  ipratropium-albuterol (DUONEB) 0.5-2.5 (3) MG/3ML nebulizer solution 3 mL (3 mLs Nebulization Given 04/23/23 2023)  LORazepam (ATIVAN) injection 0.5 mg (0.5 mg Intravenous Given 04/23/23 2027)    ED Course/ Medical Decision Making/ A&P                                 Medical  Decision Making Amount and/or Complexity of Data Reviewed Labs: ordered. Radiology: ordered.  Risk Prescription drug management. Decision regarding hospitalization.   Pecola Leisure 72 y.o. presented today for shortness of breath.  Working DDx that I considered at this time includes, but not limited to, asthma/COPD exacerbation, URI, viral illness, anemia, ACS, PE, pneumonia, pleural effusion, lung mass.    Pmhx: COPD, pulmonary embolism, hypertension  Review of prior external notes: none  Unique Tests and My Interpretation:  CBC: Leukocytosis of 22.7 no anemia VBG pH 7.4, CO2 34 CMP with sodium 131, bicarb 21, BUN 49, creatinine 1.73 given 3 L fluid for sepsis and AKI EKG: Sinus with premature atrial  CXR: Bilateral pneumonia  Respiratory Panel: RSV Positive  Consulted hospitalist discussing presentation, vitals, past medical history and labs.  Problem List / ED Course / Critical interventions / Medication management  Patient presented to emergency room with sepsis.  Has had 1 week of shortness of breath cough.  Source identified as pneumonia.  Patient significantly hypoxic requiring 15 L and nonrebreather.  Patient placed on BiPAP with improvement of shortness of breath and hypoxia.  Given multiple DuoNebs as well as steroids.  Given 30 mm/kg bolus and antibiotics.  Spoke to hospitalist who agreed to admit. I ordered medication including nebs and steroids slightly improved shortness of breath however still working hard to breathe any thus started on BiPAP.  BiPAP helped improve work of breathing and shortness of breath. Reevaluation of the patient after  these medicines showed that the patient improved Patients vitals assessed. Upon arrival patient is tachycardic, hypoxic, and increased respiratory rate with signs of respiratory distress. Places on 10L, RT at bedside.  I have reviewed the patients home medicines and have made adjustments as needed    Plan:  Admit for sepsis pneumonia         Final Clinical Impression(s) / ED Diagnoses Final diagnoses:  Pneumonia of both lungs due to infectious organism, unspecified part of lung  RSV (acute bronchiolitis due to respiratory syncytial virus)  AKI (acute kidney injury) [facility])    Rx / DC Orders ED Discharge Orders     None         Reinaldo Raddle 04/23/23 2328    Benjiman Core, MD 04/24/23 1440

## 2023-04-23 NOTE — ED Notes (Signed)
Pt still has labored breathing after first duoneb. V/o from PA to administer 2 more duonebs

## 2023-04-23 NOTE — ED Notes (Signed)
X-ray at bedside

## 2023-04-23 NOTE — Hospital Course (Addendum)
Devin Castro is a 72 y.o. male with medical history significant for COPD, Hx of provoked PE in 2016 completed 6 months Xarelto, HTN, HLD who is admitted with severe sepsis and acute hypoxic respiratory failure due to bilateral lower lobe pneumonia and COPD exacerbation and was also found to have RSV infection.  Currently he is slowly improving but continues to still require quite a bit of oxygen.  Will continue oxygen weaning and have made further medication adjustments but his oxygen is worsening.    His worsening status pulmonary was consulted for further evaluation recommending continuing current antibiotics and supportive care.  Antibiotics have now finished.  PCCM did discuss with him about BiPAP support if needed and then if unable to tolerate possible ventilator.  He is slowly improving and now weaned down to 11 L  Assessment and Plan:  Severe Sepsis due to Bilateral Lower Lobe Pneumonia, POA: -Presenting with leukocytosis, tachycardia, tachypnea, hypoxia and AKI in setting of bilateral lower lobe pneumonia as seen on CXR.   -RSV is positive and patient is being treated for potential secondary bacterial pneumonia.  -PCT 3.84 -> 2.65 -> 0.13 -WBC Trend: Recent Labs  Lab 04/23/23 1947 04/24/23 0614 04/27/23 0248 04/28/23 0304 04/29/23 0252 04/30/23 0311  WBC 22.7* 19.3* 15.3* 15.5* 16.6* 14.3*  -Continue Abx with IV Ceftriaxone and Azithromycin and per PCCM will need to continue for 7 days. PCT improved . WBC not likely to be reliable in setting of systemic steroids. -Lactic Acid Level Trend: Recent Labs  Lab 04/23/23 1958 04/23/23 2201  LATICACIDVEN 2.1* 1.4  -Send sputum culture (if able) -C/w Blood Culture monitoring (NGTD x 5 Days).  -Urine antigens still pending with urine Legionella Pneumophila Serogp 1 Ur Ag and Strep Pneumoniae Urine Antigen. Between his recent cruise, hyponatremia, GI symptoms, and slow to resolve, legionnaire's is not yet ruled out. -No further IVF  required. LA has cleared. May need to reinitiate if not taking po today. -See below -Repeat CXR this AM as below and will need to Repeat in the AM -O2 requirements still quite high and his oxygen ranges from anywhere from 4 to 14 mm.  Will place him back on a diet given that he is doing better. -Pulmonary recommending to encourage BiPAP as a likely has underlying OSA but patient continues to decline.  They are recommending aggressive pulmonary hygiene currently.   Acute Respiratory Failure with Hypoxia Due to AECOPD, RSV bronchiolitis/pneumonitis, and superimposed bacterial PNA.  -Continue supplemental oxygen to maintain SpO2 >89% and normal respiratory effort while treating conditions as outlined.  SpO2: 91 % O2 Flow Rate (L/min): 11 L/min FiO2 (%): 50 %; O2 Requirements are slightly improved and now down to 11 Liters  -Remains in SDU while still requiring high oxygen supplementation. -C/w  IS, FV and will Increase Guaifenesin 1200 mg po BID and continue Benzonatate 100 mg po PRN Cough -C/w arformoterol 15 mcg nebs twice daily, budesonide 0.25 mg nebs twice daily, and DuoNeb 3 mL every 6 as needed for wheezing or shortness of breath and will add Xopenex and Atrovent scheduled every 6h -Continue with IV steroids and increase the dose from 40 mg every 12 to 60 mg every 12 -Continue to monitor respiratory status carefully and continue supplemental oxygen via nasal cannula and wean O2 as tolerated-continuous pulse oximetry maintain O2 saturation greater 90% -Will need an ambulatory home O2 screen prior to discharge -Repeat Chest Xray done and showed "Very low lung volumes. Hazy bibasilar lung opacities, slightly improved on  the right and similar on the left, favor atelectasis." -Pulmonary consulted for further evaluation and recommendations and his markers of inflammation are getting better his procalcitonin is better and they are recommending continuing current antibiotic therapy and recommending  inhalers at discharge as well.  Dr. Wynona Neat did discuss need for possible BiPAP if the patient is unable to maintain saturations in patient is claustrophobic but aware that if he is unable to tolerate BiPAP that they may end up having to place him on a ventilator   RSV Infection and Pneumonia -Supportive care, Droplet precautions   AECOPD -Continue scheduled and prn bronchodilators as above.  -Continue IV solumedrol. Wheezing improved, but not yet resolved.    Bibasilar Pneumonia -See above -Repeat CXR done and showed " Expiratory exam with decreased lung excursion. Increased opacity in the hypoinflated lower lung fields which could be atelectasis, pneumonia or combination. Small area of new increased opacity laterally in the right upper lobe concerning for a new infiltrate." -Pulmonary Consulted for further evaluation and recommendations and is very slowly improving    AKI, improved -BUN/Cr Trend: Recent Labs  Lab 04/23/23 1947 04/24/23 0614 04/25/23 0258 04/27/23 0248 04/28/23 0304 04/29/23 0252 04/30/23 0311  BUN 41* 34* 29* 26* 23 22 26*  CREATININE 1.73* 1.17 0.73 0.76 0.67 0.51* 0.68  -Initially held Losartan, but BP is quite elevated and Cr normal, restarted ARB with Losartan 100 mg po Daily -Avoid Nephrotoxic Medications, Contrast Dyes, Hypotension and Dehydration to Ensure Adequate Renal Perfusion and will need to Renally Adjust Meds -Continue to Monitor and Trend Renal Function carefully and repeat CMP in the AM   Essential Hypertension -Continue with Amlodipine 10 mg p.o. daily, Losartan 100 g p.o. daily -Continue to Monitor blood pressures per protocol -Last BP reading still elevated at 127/71  Hyponatremia, mild -Na+ Trend: Recent Labs  Lab 04/23/23 1947 04/24/23 0614 04/25/23 0258 04/27/23 0248 04/28/23 0304 04/29/23 0252 04/30/23 0311  NA 131* 136 137 134* 135 131* 131*  -Continue to Monitor and Trend now that it dropped to 131 and is the same -Repeat  CMP in the AM   Abnormal LFTs, improved -AST and ALT Trend: Recent Labs  Lab 04/23/23 1947 04/24/23 0614 04/28/23 0304 04/29/23 0252 04/30/23 0311  AST 45* 24 21 20 21   ALT 54* 42 38 36 38  -Improved. Continue to Monitor and Trend and repeat CMP in the AM and if worsens again we will need a right upper ultrasound and acute hepatitis panel  Macrocytic Anemia -? Spurious Result -Hgb/Hct Trend: Recent Labs  Lab 04/23/23 1947 04/24/23 7829 04/27/23 0248 04/28/23 0304 04/29/23 0252 04/30/23 0311  HGB 15.0 12.0* 14.0 14.2 14.3 14.0  HCT 43.5 35.4* 42.9 42.5 44.4 41.7  MCV 98.0 102.0* 101.9* 102.2* 103.3* 102.2*  -Checked Anemia Panel and showed an iron level 47, UIBC 143, TIBC 190, saturation ratios of 25%, ferritin of 567, folate of 6.1 and vitamin B12 1366 -Continue to Monitor for S/Sx of Bleeding; No overt bleeding noted -Repeat CBC in the AM   Thrombocytosis -Likely reactive -Platelet Count Trend: Recent Labs  Lab 04/23/23 1947 04/24/23 0614 04/27/23 0248 04/28/23 0304 04/29/23 0252 04/30/23 0311  PLT 422* 295 486* 503* 521* 522*  -Continue to Monitor and Trend and repeat CMP in the AM  Hypoalbuminemia -Patient's Albumin Trend: Recent Labs  Lab 04/23/23 1947 04/24/23 0614 04/28/23 0304 04/29/23 0252 04/30/23 0311  ALBUMIN 2.9* 2.2* 2.4* 2.4* 2.5*  -Continue to Monitor and Trend and repeat CMP in the AM  Class I Obesity -Complicates overall prognosis and care -Estimated body mass index is 30.84 kg/m as calculated from the following:   Height as of this encounter: 5\' 10"  (1.778 m).   Weight as of this encounter: 97.5 kg.  -Weight Loss and Dietary Counseling given

## 2023-04-23 NOTE — Sepsis Progress Note (Signed)
[facility] following for Sepsis Protocol 

## 2023-04-23 NOTE — ED Notes (Signed)
ED TO INPATIENT HANDOFF REPORT  ED Nurse Name and Phone #: Thamas Jaegers Name/Age/Gender Pecola Leisure 72 y.o. male Room/Bed: WA20/WA20  Code Status   Code Status: Prior  Home/SNF/Other Home Patient oriented to: self, place, time, and situation Is this baseline? Yes   Triage Complete: Triage complete  Chief Complaint Severe sepsis (HCC) [A41.9, R65.20]  Triage Note Pt brought straight to room with increased work of breathing and dificulty speaking due to SOB. Pt has had productive cough and congestion with SOB x 1 week, with worsening x 3 days. Pt's O2 sats on room air 80%. NRB with 15L O2 provided.   Allergies No Known Allergies  Level of Care/Admitting Diagnosis ED Disposition     ED Disposition  Admit   Condition  --   Comment  Hospital Area: [facility] [[id number]]  Level of Care: Stepdown [14]  Admit to SDU based on following criteria: Respiratory Distress:  Frequent assessment and/or intervention to maintain adequate ventilation/respiration, pulmonary toilet, and respiratory treatment.  May admit patient to [facility] or [facility] if equivalent level of care is available:: No  Covid Evaluation: Confirmed COVID Negative  Diagnosis: Severe sepsis [facility]) [[id number]]  Admitting Physician: Charlsie Quest [[id number]]  Attending Physician: Charlsie Quest [[id number]]  Certification:: I certify this patient will need inpatient services for at least 2 midnights  Expected Medical Readiness: 04/25/2023          B Medical/Surgery History Past Medical History:  Diagnosis Date   Arthritis    COPD (chronic obstructive pulmonary disease) (HCC)    Hypertension    Kidney stone    PE (pulmonary embolism)    Shortness of breath dyspnea    Vertigo    Past Surgical History:  Procedure Laterality Date   CARDIAC CATHETERIZATION     COLONOSCOPY     kidney stone removal     TOTAL KNEE ARTHROPLASTY Bilateral 06/04/2014   Procedure: TOTAL KNEE BILATERAL;   Surgeon: Cammy Copa, MD;  Location: [facility] OR;  Service: Orthopedics;  Laterality: Bilateral;     A IV Location/Drains/Wounds Patient Lines/Drains/Airways Status     Active Line/Drains/Airways     Name Placement date Placement time Site Days   Peripheral IV 04/23/23 20 G 1" Anterior;Distal;Left;Upper Arm 04/23/23  1940  Arm  less than 1            Intake/Output Last 24 hours  Intake/Output Summary (Last 24 hours) at 04/23/2023 2133 Last data filed at 04/23/2023 2031 Gross per 24 hour  Intake 100 ml  Output --  Net 100 ml    Labs/Imaging Results for orders placed or performed during the hospital encounter of 04/23/23 (from the past 48 hour(s))  Blood gas, venous     Status: Abnormal   Collection Time: 04/23/23  7:45 PM  Result Value Ref Range   pH, Ven 7.44 (H) 7.25 - 7.43   pCO2, Ven 34 (L) 44 - 60 mmHg   pO2, Ven 33 32 - 45 mmHg   Bicarbonate 23.2 20.0 - 28.0 mmol/L   Acid-base deficit 0.6 0.0 - 2.0 mmol/L   O2 Saturation 52.8 %   Patient temperature 36.1     Comment: Performed at [facility], [address], [place], [place] [address]  Resp panel by RT-PCR (RSV, Flu A&B, Covid) Anterior Nasal Swab     Status: Abnormal   Collection Time: 04/23/23  7:47 PM   Specimen: Anterior Nasal Swab  Result Value Ref Range  SARS Coronavirus 2 by RT PCR NEGATIVE NEGATIVE    Comment: (NOTE) SARS-CoV-2 target nucleic acids are NOT DETECTED.  The SARS-CoV-2 RNA is generally detectable in upper respiratory specimens during the acute phase of infection. The lowest concentration of SARS-CoV-2 viral copies this assay can detect is 138 copies/mL. A negative result does not preclude SARS-Cov-2 infection and should not be used as the sole basis for treatment or other patient management decisions. A negative result may occur with  improper specimen collection/handling, submission of specimen other than nasopharyngeal swab, presence of viral mutation(s) within  the areas targeted by this assay, and inadequate number of viral copies(<138 copies/mL). A negative result must be combined with clinical observations, patient history, and epidemiological information. The expected result is Negative.  Fact Sheet for Patients:  BloggerCourse.com  Fact Sheet for Healthcare Providers:  SeriousBroker.it  This test is no t yet approved or cleared by the [place] FDA and  has been authorized for detection and/or diagnosis of SARS-CoV-2 by FDA under an Emergency Use Authorization (EUA). This EUA will remain  in effect (meaning this test can be used) for the duration of the COVID-19 declaration under Section 564(b)(1) of the Act, 21 U.S.C.section 360bbb-3(b)(1), unless the authorization is terminated  or revoked sooner.       Influenza A by PCR NEGATIVE NEGATIVE   Influenza B by PCR NEGATIVE NEGATIVE    Comment: (NOTE) The Xpert Xpress SARS-CoV-2/FLU/RSV plus assay is intended as an aid in the diagnosis of influenza from Nasopharyngeal swab specimens and should not be used as a sole basis for treatment. Nasal washings and aspirates are unacceptable for Xpert Xpress SARS-CoV-2/FLU/RSV testing.  Fact Sheet for Patients: BloggerCourse.com  Fact Sheet for Healthcare Providers: SeriousBroker.it  This test is not yet approved or cleared by the [place] FDA and has been authorized for detection and/or diagnosis of SARS-CoV-2 by FDA under an Emergency Use Authorization (EUA). This EUA will remain in effect (meaning this test can be used) for the duration of the COVID-19 declaration under Section 564(b)(1) of the Act, 21 U.S.C. section 360bbb-3(b)(1), unless the authorization is terminated or revoked.     Resp Syncytial Virus by PCR POSITIVE (A) NEGATIVE    Comment: (NOTE) Fact Sheet for  Patients: BloggerCourse.com  Fact Sheet for Healthcare Providers: SeriousBroker.it  This test is not yet approved or cleared by the [place] FDA and has been authorized for detection and/or diagnosis of SARS-CoV-2 by FDA under an Emergency Use Authorization (EUA). This EUA will remain in effect (meaning this test can be used) for the duration of the COVID-19 declaration under Section 564(b)(1) of the Act, 21 U.S.C. section 360bbb-3(b)(1), unless the authorization is terminated or revoked.  Performed at [facility], [address], [place], [place] [address]   Comprehensive metabolic panel     Status: Abnormal   Collection Time: 04/23/23  7:47 PM  Result Value Ref Range   Sodium 131 (L) 135 - 145 mmol/L   Potassium 4.0 3.5 - 5.1 mmol/L   Chloride 95 (L) 98 - 111 mmol/L   CO2 21 (L) 22 - 32 mmol/L   Glucose, Bld 147 (H) 70 - 99 mg/dL    Comment: Glucose reference range applies only to samples taken after fasting for at least 8 hours.   BUN 41 (H) 8 - 23 mg/dL   Creatinine, Ser 4.40 (H) 0.61 - 1.24 mg/dL   Calcium 8.2 (L) 8.9 - 10.3 mg/dL   Total Protein 8.2 (H) 6.5 -  8.1 g/dL   Albumin 2.9 (L) 3.5 - 5.0 g/dL   AST 45 (H) 15 - 41 U/L   ALT 54 (H) 0 - 44 U/L   Alkaline Phosphatase 71 38 - 126 U/L   Total Bilirubin 1.5 (H) <1.2 mg/dL   GFR, Estimated 42 (L) >60 mL/min    Comment: (NOTE) Calculated using the CKD-EPI Creatinine Equation (2021)    Anion gap 15 5 - 15    Comment: Performed at [facility], [address], [place], [place] [address]  CBC     Status: Abnormal   Collection Time: 04/23/23  7:47 PM  Result Value Ref Range   WBC 22.7 (H) 4.0 - 10.5 K/uL   RBC 4.44 4.22 - 5.81 MIL/uL   Hemoglobin 15.0 13.0 - 17.0 g/dL   HCT 47.4 25.9 - 56.3 %   MCV 98.0 80.0 - 100.0 fL   MCH 33.8 26.0 - 34.0 pg   MCHC 34.5 30.0 - 36.0 g/dL   RDW 87.5 64.3 - 32.9 %   Platelets 422 (H) 150 -  400 K/uL   nRBC 0.0 0.0 - 0.2 %    Comment: Performed at [facility], [address] [place], [place] [address]  I-Stat CG4 Lactic Acid     Status: Abnormal   Collection Time: 04/23/23  7:58 PM  Result Value Ref Range   Lactic Acid, Venous 2.1 (HH) 0.5 - 1.9 mmol/L   Comment NOTIFIED PHYSICIAN    DG Chest Port 1 View  Result Date: 04/23/2023 CLINICAL DATA:  Shortness of breath EXAM: PORTABLE CHEST 1 VIEW COMPARISON:  05/22/2019 FINDINGS: Heart and mediastinal contours are within normal limits. Patchy opacities in lower lungs bilaterally. No effusions or pneumothorax. No acute bony abnormality. IMPRESSION: Patchy bilateral lower lobe airspace opacities concerning for pneumonia. Electronically Signed   By: Charlett Nose M.D.   On: 04/23/2023 20:40    Pending Labs Unresulted Labs (From admission, onward)     Start     Ordered   04/23/23 1949  Blood culture (routine x 2)  BLOOD CULTURE X 2,   R      04/23/23 1948            Vitals/Pain Today's Vitals   04/23/23 2036 04/23/23 2045 04/23/23 2050 04/23/23 2057  BP: 127/73 120/77    Pulse: (!) 131 68 62   Resp: (!) 40 (!) 35 (!) 34   Temp:      TempSrc:      SpO2: 97% 95% 95%   Weight:    96 kg  PainSc:        Isolation Precautions No active isolations  Medications Medications  sodium chloride flush (NS) 0.9 % injection 10 mL (has no administration in time range)  azithromycin (ZITHROMAX) 500 mg in sodium chloride 0.9 % 250 mL IVPB (500 mg Intravenous New Bag/Given 04/23/23 2032)  sodium chloride 0.9 % bolus 1,000 mL (has no administration in time range)  sodium chloride 0.9 % bolus 1,000 mL (has no administration in time range)  ipratropium-albuterol (DUONEB) 0.5-2.5 (3) MG/3ML nebulizer solution 3 mL (3 mLs Nebulization Given 04/23/23 1951)  methylPREDNISolone sodium succinate (SOLU-MEDROL) 125 mg/2 mL injection 125 mg (125 mg Intravenous Given 04/23/23 1954)  sodium chloride 0.9 % bolus 1,000 mL (1,000  mLs Intravenous New Bag/Given 04/23/23 2015)  cefTRIAXone (ROCEPHIN) 1 g in sodium chloride 0.9 % 100 mL IVPB (0 g Intravenous Stopped 04/23/23 2031)  ipratropium-albuterol (DUONEB) 0.5-2.5 (3) MG/3ML nebulizer solution 3  mL (3 mLs Nebulization Given 04/23/23 2023)  LORazepam (ATIVAN) injection 0.5 mg (0.5 mg Intravenous Given 04/23/23 2027)    Mobility walks     Focused Assessments     R Recommendations: See Admitting Provider Note  Report given to:   Additional Notes:

## 2023-04-23 NOTE — ED Triage Notes (Signed)
Pt brought straight to room with increased work of breathing and dificulty speaking due to SOB. Pt has had productive cough and congestion with SOB x 1 week, with worsening x 3 days. Pt's O2 sats on room air 80%. NRB with 15L O2 provided.

## 2023-04-23 NOTE — Progress Notes (Signed)
   04/23/23 2323  BiPAP/CPAP/SIPAP  BiPAP/CPAP/SIPAP Pt Type Adult  BiPAP/CPAP/SIPAP V60  Mask Type Full face mask  Mask Size Medium  Set Rate 16 breaths/min  Respiratory Rate 23 breaths/min  IPAP 18 cmH20  EPAP 8 cmH2O  FiO2 (%) 50 %  Minute Ventilation 21  Leak 0  Peak Inspiratory Pressure (PIP) 19  Tidal Volume (Vt) 748  Patient Home Equipment No  Auto Titrate No  Press High Alarm 35 cmH2O  Press Low Alarm 5 cmH2O  BiPAP/CPAP /SiPAP Vitals  Pulse Rate 91  Resp (!) 28  BP 123/68  SpO2 97 %  Bilateral Breath Sounds Diminished;Expiratory wheezes  MEWS Score/Color  MEWS Score 2  MEWS Score Color Yellow

## 2023-04-24 ENCOUNTER — Encounter (HOSPITAL_COMMUNITY): Payer: Self-pay | Admitting: Internal Medicine

## 2023-04-24 ENCOUNTER — Other Ambulatory Visit: Payer: Self-pay

## 2023-04-24 DIAGNOSIS — A419 Sepsis, unspecified organism: Secondary | ICD-10-CM | POA: Diagnosis not present

## 2023-04-24 DIAGNOSIS — R6520 Severe sepsis without septic shock: Secondary | ICD-10-CM | POA: Diagnosis not present

## 2023-04-24 LAB — CBC
HCT: 35.4 % — ABNORMAL LOW (ref 39.0–52.0)
Hemoglobin: 12 g/dL — ABNORMAL LOW (ref 13.0–17.0)
MCH: 34.6 pg — ABNORMAL HIGH (ref 26.0–34.0)
MCHC: 33.9 g/dL (ref 30.0–36.0)
MCV: 102 fL — ABNORMAL HIGH (ref 80.0–100.0)
Platelets: 295 10*3/uL (ref 150–400)
RBC: 3.47 MIL/uL — ABNORMAL LOW (ref 4.22–5.81)
RDW: 13.4 % (ref 11.5–15.5)
WBC: 19.3 10*3/uL — ABNORMAL HIGH (ref 4.0–10.5)
nRBC: 0 % (ref 0.0–0.2)

## 2023-04-24 LAB — COMPREHENSIVE METABOLIC PANEL
ALT: 42 U/L (ref 0–44)
AST: 24 U/L (ref 15–41)
Albumin: 2.2 g/dL — ABNORMAL LOW (ref 3.5–5.0)
Alkaline Phosphatase: 56 U/L (ref 38–126)
Anion gap: 11 (ref 5–15)
BUN: 34 mg/dL — ABNORMAL HIGH (ref 8–23)
CO2: 21 mmol/L — ABNORMAL LOW (ref 22–32)
Calcium: 7.4 mg/dL — ABNORMAL LOW (ref 8.9–10.3)
Chloride: 104 mmol/L (ref 98–111)
Creatinine, Ser: 1.17 mg/dL (ref 0.61–1.24)
GFR, Estimated: >60 mL/min (ref >60)
Glucose, Bld: 194 mg/dL — ABNORMAL HIGH (ref 70–99)
Potassium: 4 mmol/L (ref 3.5–5.1)
Sodium: 136 mmol/L (ref 135–145)
Total Bilirubin: 0.7 mg/dL (ref <1.2)
Total Protein: 6.6 g/dL (ref 6.5–8.1)

## 2023-04-24 LAB — HIV ANTIBODY (ROUTINE TESTING W REFLEX): HIV Screen 4th Generation wRfx: NONREACTIVE

## 2023-04-24 LAB — PROCALCITONIN: Procalcitonin: 3.84 ng/mL

## 2023-04-24 MED ORDER — PNEUMOCOCCAL 20-VAL CONJ VACC 0.5 ML IM SUSY: 0.5000 mL | PREFILLED_SYRINGE | INTRAMUSCULAR | Status: DC | PRN

## 2023-04-24 MED ORDER — SALINE SPRAY 0.65 % NA SOLN
1.0000 | NASAL | Status: DC | PRN
  Administered 2023-04-24: 1 via NASAL
  Filled 2023-04-24: qty 44

## 2023-04-24 MED ORDER — INFLUENZA VAC A&B SURF ANT ADJ 0.5 ML IM SUSY: 0.5000 mL | PREFILLED_SYRINGE | INTRAMUSCULAR | Status: DC | PRN

## 2023-04-24 MED ORDER — ORAL CARE MOUTH RINSE: 15.0000 mL | OROMUCOSAL | Status: DC | PRN

## 2023-04-24 MED ORDER — BENZONATATE 100 MG PO CAPS: 100.0000 mg | ORAL_CAPSULE | Freq: Once | ORAL | Status: DC | PRN

## 2023-04-24 MED ORDER — CHLORHEXIDINE GLUCONATE CLOTH 2 % EX PADS
6.0000 | MEDICATED_PAD | Freq: Every day | CUTANEOUS | Status: DC
Start: 2023-04-24 — End: 2023-05-05
  Administered 2023-04-24 – 2023-05-03 (×10): 6 via TOPICAL

## 2023-04-24 MED ORDER — ORAL CARE MOUTH RINSE
15.0000 mL | OROMUCOSAL | Status: DC
  Administered 2023-04-24 – 2023-05-01 (×25): 15 mL via OROMUCOSAL

## 2023-04-24 MED ORDER — AMLODIPINE BESYLATE 5 MG PO TABS
10.0000 mg | ORAL_TABLET | Freq: Every evening | ORAL | Status: DC
Start: 2023-04-24 — End: 2023-05-05
  Administered 2023-04-24 – 2023-05-04 (×11): 10 mg via ORAL
  Filled 2023-04-24 (×3): qty 1
  Filled 2023-04-24: qty 2
  Filled 2023-04-24 (×6): qty 1
  Filled 2023-04-24: qty 2

## 2023-04-24 NOTE — Progress Notes (Signed)
[facility] PROGRESS NOTE  Devin Castro (DOB: Dec 15, 1950) Devin Castro PCP: Eartha Inch, MD  Brief Narrative: Devin Castro is a 72 y.o. male with a history of COPD, PE no longer anticoagulated, HTN, HLD who presented to the ED on 04/23/2023 with progressive dyspnea, cough, nasal congestion worsening over the previous week. He was wheezing and hypoxemic requiring BiPAP. WBC 22.7k, Cr 1.73 (baseline 0.85), lactic acid 2.1. RSV PCR positive, CXR showed patchy bibasilar opacities. IV fluids, antibiotics, and steroids were given and he was admitted to SDU for sepsis and respiratory failure due to RSV-induced AECOPD complicated by superimposed bacterial pneumonia.   Subjective: Feels much better, off BiPAP for ~30 minutes as of this morning. No chest pain or other symptoms reported.   Objective: BP (!) 154/78   Pulse 83   Temp (!) 96.7 F (35.9 C) (Axillary) Comment: RN notifed; turned up heat in pt's room  Resp (!) 24   Ht 5\' 10"  (1.778 m)   Wt 98.1 kg   SpO2 94%   BMI 31.03 kg/m   Gen: No distress Pulm: Coarse bilaterally, nonlabored tachypnea  CV: RRR, no MRG or pitting edema GI: Soft, NT, ND, +BS  Neuro: Alert and oriented. No new focal deficits. Ext: Warm, no deformities. Skin: No rashes, lesions or ulcers on visualized skin   Assessment & Plan: Severe sepsis due to bilateral lower lobe pneumonia, POA: Presenting with leukocytosis, tachycardia, tachypnea, hypoxia and AKI in setting of bilateral lower lobe pneumonia as seen on CXR.  RSV is positive and patient is being treated for potential secondary bacterial pneumonia. -Continue abx, culture monitoring. - No further IVF required. LA has cleared.    Acute hypoxic respiratory failure: Due to AECOPD, RSV bronchiolitis/pneumonitis, and superimposed bacterial PNA.  - Continue supplemental oxygen to maintain SpO2 >89% and normal respiratory effort while treating conditions as outlined. - Remains in SDU while needing prn  BiPAP - IS, FV  RSV infection: Supportive care, droplet precautions  AECOPD:  - Continue scheduled and prn bronchodilators.  - Continue IV solumedrol.   Bibasilar pneumonia: PCT 3.84, WBC 22.7k, this is likely cause for worsening later into disease course.  - Continue ceftriaxone, azithromycin - Monitor blood cultures, sputum culture (if able), urinary antigens (yet to be collected)  AKI: SCr 1.7 > 1.17 thus far improving. Baseline SCr 0.85.  - Continue holding ARB for now.  - Avoid nephrotoxins, hypotension.  - Treat infection. Check urinalysis if not continuing improvement.    HTN:  - Restart norvasc  Tyrone Nine, MD [facility] [email or web address] 04/24/2023, 12:46 PM

## 2023-04-24 NOTE — ED Notes (Signed)
Receiving unit contacted still no update on when RN would be assigned.

## 2023-04-24 NOTE — ED Notes (Signed)
ED TO INPATIENT HANDOFF REPORT  Name/Age/Gender Devin Castro 72 y.o. male  Code Status    Code Status Orders  (From admission, onward)           Start     Ordered   04/23/23 2134  Full code  Continuous       Question:  By:  Answer:  Consent: discussion documented in EHR   04/23/23 2136           Code Status History     Date Active Date Inactive Code Status Order ID Comments User Context   05/16/2019 2251 05/25/2019 0102 Full Code [id number]  Rometta Emery, MD ED   06/15/2014 1345 06/17/2014 1717 Full Code [id number]  Maretta Bees, MD Inpatient   06/04/2014 1627 06/09/2014 1631 Full Code [id number]  Cammy Copa, MD Inpatient       Home/SNF/Other Home  Chief Complaint Severe sepsis (HCC) [A41.9, R65.20]  Level of Care/Admitting Diagnosis ED Disposition     ED Disposition  Admit   Condition  --   Comment  Hospital Area: [facility] [[id number]]  Level of Care: Stepdown [14]  Admit to SDU based on following criteria: Respiratory Distress:  Frequent assessment and/or intervention to maintain adequate ventilation/respiration, pulmonary toilet, and respiratory treatment.  May admit patient to [facility] or [facility] if equivalent level of care is available:: No  Covid Evaluation: Confirmed COVID Negative  Diagnosis: Severe sepsis [facility]) [[id number]]  Admitting Physician: Charlsie Quest [[id number]]  Attending Physician: Charlsie Quest [[id number]]  Certification:: I certify this patient will need inpatient services for at least 2 midnights  Expected Medical Readiness: 04/25/2023          Medical History Past Medical History:  Diagnosis Date   Arthritis    COPD (chronic obstructive pulmonary disease) (HCC)    Hypertension    Kidney stone    PE (pulmonary embolism)    Shortness of breath dyspnea    Vertigo     Allergies No Known Allergies  IV Location/Drains/Wounds Patient Lines/Drains/Airways Status     Active  Line/Drains/Airways     Name Placement date Placement time Site Days   Peripheral IV 04/23/23 20 G 1" Anterior;Distal;Left;Upper Arm 04/23/23  1940  Arm  1            Labs/Imaging Results for orders placed or performed during the hospital encounter of 04/23/23 (from the past 48 hour(s))  Blood gas, venous     Status: Abnormal   Collection Time: 04/23/23  7:45 PM  Result Value Ref Range   pH, Ven 7.44 (H) 7.25 - 7.43   pCO2, Ven 34 (L) 44 - 60 mmHg   pO2, Ven 33 32 - 45 mmHg   Bicarbonate 23.2 20.0 - 28.0 mmol/L   Acid-base deficit 0.6 0.0 - 2.0 mmol/L   O2 Saturation 52.8 %   Patient temperature 36.1     Comment: Performed at [facility], [address] [place], [place] [address]  Resp panel by RT-PCR (RSV, Flu A&B, Covid) Anterior Nasal Swab     Status: Abnormal   Collection Time: 04/23/23  7:47 PM   Specimen: Anterior Nasal Swab  Result Value Ref Range   SARS Coronavirus 2 by RT PCR NEGATIVE NEGATIVE    Comment: (NOTE) SARS-CoV-2 target nucleic acids are NOT DETECTED.  The SARS-CoV-2 RNA is generally detectable in upper respiratory specimens during the acute phase of infection. The lowest concentration of SARS-CoV-2 viral copies this  assay can detect is 138 copies/mL. A negative result does not preclude SARS-Cov-2 infection and should not be used as the sole basis for treatment or other patient management decisions. A negative result may occur with  improper specimen collection/handling, submission of specimen other than nasopharyngeal swab, presence of viral mutation(s) within the areas targeted by this assay, and inadequate number of viral copies(<138 copies/mL). A negative result must be combined with clinical observations, patient history, and epidemiological information. The expected result is Negative.  Fact Sheet for Patients:  BloggerCourse.com  Fact Sheet for Healthcare Providers:   SeriousBroker.it  This test is no t yet approved or cleared by the [place] FDA and  has been authorized for detection and/or diagnosis of SARS-CoV-2 by FDA under an Emergency Use Authorization (EUA). This EUA will remain  in effect (meaning this test can be used) for the duration of the COVID-19 declaration under Section 564(b)(1) of the Act, 21 U.S.C.section 360bbb-3(b)(1), unless the authorization is terminated  or revoked sooner.       Influenza A by PCR NEGATIVE NEGATIVE   Influenza B by PCR NEGATIVE NEGATIVE    Comment: (NOTE) The Xpert Xpress SARS-CoV-2/FLU/RSV plus assay is intended as an aid in the diagnosis of influenza from Nasopharyngeal swab specimens and should not be used as a sole basis for treatment. Nasal washings and aspirates are unacceptable for Xpert Xpress SARS-CoV-2/FLU/RSV testing.  Fact Sheet for Patients: BloggerCourse.com  Fact Sheet for Healthcare Providers: SeriousBroker.it  This test is not yet approved or cleared by the [place] FDA and has been authorized for detection and/or diagnosis of SARS-CoV-2 by FDA under an Emergency Use Authorization (EUA). This EUA will remain in effect (meaning this test can be used) for the duration of the COVID-19 declaration under Section 564(b)(1) of the Act, 21 U.S.C. section 360bbb-3(b)(1), unless the authorization is terminated or revoked.     Resp Syncytial Virus by PCR POSITIVE (A) NEGATIVE    Comment: (NOTE) Fact Sheet for Patients: BloggerCourse.com  Fact Sheet for Healthcare Providers: SeriousBroker.it  This test is not yet approved or cleared by the [place] FDA and has been authorized for detection and/or diagnosis of SARS-CoV-2 by FDA under an Emergency Use Authorization (EUA). This EUA will remain in effect (meaning this test can be used) for the duration  of the COVID-19 declaration under Section 564(b)(1) of the Act, 21 U.S.C. section 360bbb-3(b)(1), unless the authorization is terminated or revoked.  Performed at [facility], [address] [place], [place] [address]   Comprehensive metabolic panel     Status: Abnormal   Collection Time: 04/23/23  7:47 PM  Result Value Ref Range   Sodium 131 (L) 135 - 145 mmol/L   Potassium 4.0 3.5 - 5.1 mmol/L   Chloride 95 (L) 98 - 111 mmol/L   CO2 21 (L) 22 - 32 mmol/L   Glucose, Bld 147 (H) 70 - 99 mg/dL    Comment: Glucose reference range applies only to samples taken after fasting for at least 8 hours.   BUN 41 (H) 8 - 23 mg/dL   Creatinine, Ser 6.04 (H) 0.61 - 1.24 mg/dL   Calcium 8.2 (L) 8.9 - 10.3 mg/dL   Total Protein 8.2 (H) 6.5 - 8.1 g/dL   Albumin 2.9 (L) 3.5 - 5.0 g/dL   AST 45 (H) 15 - 41 U/L   ALT 54 (H) 0 - 44 U/L   Alkaline Phosphatase 71 38 - 126 U/L   Total Bilirubin 1.5 (H) <  1.2 mg/dL   GFR, Estimated 42 (L) >60 mL/min    Comment: (NOTE) Calculated using the CKD-EPI Creatinine Equation (2021)    Anion gap 15 5 - 15    Comment: Performed at [facility], [address], [place], [place] [address]  CBC     Status: Abnormal   Collection Time: 04/23/23  7:47 PM  Result Value Ref Range   WBC 22.7 (H) 4.0 - 10.5 K/uL   RBC 4.44 4.22 - 5.81 MIL/uL   Hemoglobin 15.0 13.0 - 17.0 g/dL   HCT 82.9 56.2 - 13.0 %   MCV 98.0 80.0 - 100.0 fL   MCH 33.8 26.0 - 34.0 pg   MCHC 34.5 30.0 - 36.0 g/dL   RDW 86.5 78.4 - 69.6 %   Platelets 422 (H) 150 - 400 K/uL   nRBC 0.0 0.0 - 0.2 %    Comment: Performed at [facility], [address], [place], [place] [address]  I-Stat CG4 Lactic Acid     Status: Abnormal   Collection Time: 04/23/23  7:58 PM  Result Value Ref Range   Lactic Acid, Venous 2.1 (HH) 0.5 - 1.9 mmol/L   Comment NOTIFIED PHYSICIAN   Blood culture (routine x 2)     Status: None (Preliminary result)   Collection  Time: 04/23/23  8:10 PM   Specimen: BLOOD LEFT FOREARM  Result Value Ref Range   Specimen Description      BLOOD LEFT FOREARM Performed at [facility], [address], [place], [place] [address]    Special Requests      BOTTLES DRAWN AEROBIC AND ANAEROBIC Blood Culture results may not be optimal due to an inadequate volume of blood received in culture bottles Performed at [facility], [address] [place], [place] [address]    Culture PENDING    Report Status PENDING   I-Stat CG4 Lactic Acid     Status: None   Collection Time: 04/23/23 10:01 PM  Result Value Ref Range   Lactic Acid, Venous 1.4 0.5 - 1.9 mmol/L   DG Chest Port 1 View  Result Date: 04/23/2023 CLINICAL DATA:  Shortness of breath EXAM: PORTABLE CHEST 1 VIEW COMPARISON:  05/22/2019 FINDINGS: Heart and mediastinal contours are within normal limits. Patchy opacities in lower lungs bilaterally. No effusions or pneumothorax. No acute bony abnormality. IMPRESSION: Patchy bilateral lower lobe airspace opacities concerning for pneumonia. Electronically Signed   By: Charlett Nose M.D.   On: 04/23/2023 20:40    Pending Labs Unresulted Labs (From admission, onward)     Start     Ordered   04/24/23 0500  HIV Antibody (routine testing w rflx)  (HIV Antibody (Routine testing w reflex) panel)  Tomorrow morning,   R        04/23/23 2136   04/24/23 0500  Procalcitonin  Tomorrow morning,   R       References:    Procalcitonin Lower Respiratory Tract Infection AND Sepsis Procalcitonin Algorithm   04/23/23 2136   04/24/23 0500  CBC  Tomorrow morning,   R        04/23/23 2136   04/24/23 0500  Comprehensive metabolic panel  Tomorrow morning,   R        04/23/23 2136   04/23/23 2137  Strep pneumoniae urinary antigen  Once,   R        04/23/23 2136   04/23/23 2137  Legionella Pneumophila Serogp 1 Ur Ag  Once,  R        04/23/23 2136   04/23/23 1949  Blood culture (routine x 2)  BLOOD CULTURE X 2,   R (with  STAT occurrences)      04/23/23 1948            Vitals/Pain Today's Vitals   04/23/23 2145 04/23/23 2323 04/23/23 2354 04/24/23 0000  BP: 123/74 123/68  127/69  Pulse: (!) 109 91 88 85  Resp: (!) 33 (!) 28 (!) 27 (!) 24  Temp:   98.3 F (36.8 C)   TempSrc:   Axillary   SpO2: 97% 97% 100% 100%  Weight:      PainSc:        Isolation Precautions Droplet precaution  Medications Medications  sodium chloride flush (NS) 0.9 % injection 10 mL (has no administration in time range)  azithromycin (ZITHROMAX) 500 mg in sodium chloride 0.9 % 250 mL IVPB (0 mg Intravenous Stopped 04/23/23 2143)  enoxaparin (LOVENOX) injection 40 mg (40 mg Subcutaneous Given 04/23/23 2354)  sodium chloride flush (NS) 0.9 % injection 3 mL (has no administration in time range)  cefTRIAXone (ROCEPHIN) 2 g in sodium chloride 0.9 % 100 mL IVPB (has no administration in time range)  acetaminophen (TYLENOL) tablet 650 mg (has no administration in time range)    Or  acetaminophen (TYLENOL) suppository 650 mg (has no administration in time range)  ondansetron (ZOFRAN) tablet 4 mg (has no administration in time range)    Or  ondansetron (ZOFRAN) injection 4 mg (has no administration in time range)  senna-docusate (Senokot-S) tablet 1 tablet (has no administration in time range)  guaiFENesin (MUCINEX) 12 hr tablet 600 mg (600 mg Oral Patient Refused/Not Given 04/23/23 2355)  budesonide (PULMICORT) nebulizer solution 0.25 mg (0.25 mg Nebulization Given 04/23/23 2322)  arformoterol (BROVANA) nebulizer solution 15 mcg (15 mcg Nebulization Given 04/23/23 2322)  methylPREDNISolone sodium succinate (SOLU-MEDROL) 40 mg/mL injection 40 mg (has no administration in time range)  ipratropium-albuterol (DUONEB) 0.5-2.5 (3) MG/3ML nebulizer solution 3 mL (has no administration in time range)  ipratropium-albuterol (DUONEB) 0.5-2.5 (3) MG/3ML nebulizer solution 3 mL (3 mLs Nebulization Given 04/23/23 1951)  methylPREDNISolone sodium  succinate (SOLU-MEDROL) 125 mg/2 mL injection 125 mg (125 mg Intravenous Given 04/23/23 1954)  sodium chloride 0.9 % bolus 1,000 mL (0 mLs Intravenous Stopped 04/23/23 2151)  cefTRIAXone (ROCEPHIN) 1 g in sodium chloride 0.9 % 100 mL IVPB (0 g Intravenous Stopped 04/23/23 2031)  ipratropium-albuterol (DUONEB) 0.5-2.5 (3) MG/3ML nebulizer solution 3 mL (3 mLs Nebulization Given 04/23/23 2023)  LORazepam (ATIVAN) injection 0.5 mg (0.5 mg Intravenous Given 04/23/23 2027)  sodium chloride 0.9 % bolus 1,000 mL (1,000 mLs Intravenous New Bag/Given 04/23/23 2151)  sodium chloride 0.9 % bolus 1,000 mL (1,000 mLs Intravenous Bolus 04/23/23 2355)    Mobility walks with device

## 2023-04-24 NOTE — Plan of Care (Signed)

## 2023-04-24 NOTE — Progress Notes (Signed)
Pt transferred from ED To ICU 1226 on the bipap without any complications. RT in the unit given report. Pt alert and comfortable at this time.

## 2023-04-24 NOTE — Plan of Care (Signed)
  Problem: Fluid Volume: Goal: Hemodynamic stability will improve Outcome: Progressing   Problem: Respiratory: Goal: Ability to maintain adequate ventilation will improve Outcome: Progressing   Problem: Coping: Goal: Level of anxiety will decrease Outcome: Progressing   Problem: Pain Management: Goal: General experience of comfort will improve Outcome: Progressing   Problem: Clinical Measurements: Goal: Signs and symptoms of infection will decrease Outcome: Not Progressing

## 2023-04-25 DIAGNOSIS — A419 Sepsis, unspecified organism: Secondary | ICD-10-CM | POA: Diagnosis not present

## 2023-04-25 DIAGNOSIS — R6520 Severe sepsis without septic shock: Secondary | ICD-10-CM | POA: Diagnosis not present

## 2023-04-25 LAB — BASIC METABOLIC PANEL
Anion gap: 10 (ref 5–15)
BUN: 29 mg/dL — ABNORMAL HIGH (ref 8–23)
CO2: 23 mmol/L (ref 22–32)
Calcium: 8.5 mg/dL — ABNORMAL LOW (ref 8.9–10.3)
Chloride: 104 mmol/L (ref 98–111)
Creatinine, Ser: 0.73 mg/dL (ref 0.61–1.24)
GFR, Estimated: >60 mL/min (ref >60)
Glucose, Bld: 173 mg/dL — ABNORMAL HIGH (ref 70–99)
Potassium: 4.3 mmol/L (ref 3.5–5.1)
Sodium: 137 mmol/L (ref 135–145)

## 2023-04-25 LAB — PROCALCITONIN: Procalcitonin: 2.65 ng/mL

## 2023-04-25 MED ORDER — MELATONIN 5 MG PO TABS
5.0000 mg | ORAL_TABLET | Freq: Once | ORAL | Status: AC
  Administered 2023-04-25: 5 mg via ORAL
  Filled 2023-04-25: qty 1

## 2023-04-25 NOTE — Progress Notes (Signed)
[facility] PROGRESS NOTE  MITHUN GOLDSWORTHY (DOB: Mar 23, 1951) WGN:[id number] PCP: Eartha Inch, MD  Brief Narrative: Devin Castro is a 72 y.o. male with a history of COPD, PE no longer anticoagulated, HTN, HLD who presented to the ED on 04/23/2023 with progressive dyspnea, cough, nasal congestion worsening over the previous week. He was wheezing and hypoxemic requiring BiPAP. WBC 22.7k, Cr 1.73 (baseline 0.85), lactic acid 2.1. RSV PCR positive, CXR showed patchy bibasilar opacities. IV fluids, antibiotics, and steroids were given and he was admitted to SDU for sepsis and respiratory failure due to RSV-induced AECOPD complicated by superimposed bacterial pneumonia.   Subjective: Feel short of breath, but generally better. No chest pains mentioned. Taking po. Had BM.  Objective: BP (!) 145/81   Pulse 82   Temp 97.7 F (36.5 C) (Oral)   Resp (!) 27   Ht 5\' 10"  (1.778 m)   Wt 98.4 kg   SpO2 94%   BMI 31.13 kg/m   Gen: Elderly male in no acute distress Pulm: Coarse, no crackles. Nonlabored to 10L 96%  CV: RRR, no MRG or pitting edema GI: Soft, NT, ND, +BS Neuro: Alert and oriented. No new focal deficits. Ext: Warm, no deformities Skin: No rashes, lesions or ulcers on visualized skin   Assessment & Plan: Severe sepsis due to bilateral lower lobe pneumonia, POA: Presenting with leukocytosis, tachycardia, tachypnea, hypoxia and AKI in setting of bilateral lower lobe pneumonia as seen on CXR.  RSV is positive and patient is being treated for potential secondary bacterial pneumonia. - Continue ceftriaxone, azithromycin, culture monitoring (NGTD 2d). - No further IVF required. LA has cleared.    Acute hypoxic respiratory failure: Due to AECOPD, RSV bronchiolitis/pneumonitis, and superimposed bacterial PNA.  - Continue supplemental oxygen to maintain SpO2 >89% and normal respiratory effort while treating conditions as outlined. Remains on 10L  - Remains in SDU while needing prn  BiPAP, high flow.  - IS, FV  RSV infection: Supportive care, droplet precautions  AECOPD:  - Continue scheduled and prn bronchodilators.  - Continue IV solumedrol. Still wheezing  Bibasilar pneumonia: PCT 3.84, WBC 22.7k, this is likely cause for worsening later into disease course.  - Continue ceftriaxone, azithromycin. PCT declining but still elevated. WBC not likely to be reliable in setting of systemic steroids. - Monitor blood cultures, sputum culture (if able), urinary antigens (yet to be collected)  AKI: SCr 1.7 >> 0.73 back to baseline.  - Continue holding ARB for now.  - Avoid nephrotoxins, hypotension.    HTN:  - Restarted norvasc  Tyrone Nine, MD [facility] [email or web address] 04/25/2023, 9:19 AM

## 2023-04-25 NOTE — Progress Notes (Signed)
Transition of Care [facility]) - Inpatient Brief Assessment   Patient Details  Name: Devin Castro MRN: [id number] Date of Birth: [birth year]  Transition of Care [facility]) CM/SW Contact:    Larrie Kass, LCSW Phone Number: 04/25/2023, 1:44 PM   Clinical Narrative:  Transition of Care Department [facility]) has reviewed patient and no TOC needs have been identified at this time. We will continue to monitor patient advancement through interdisciplinary progression rounds. If new patient transition needs arise, please place a TOC consult.   Transition of Care Asessment: Insurance and Status: Insurance coverage has been reviewed Patient has primary care physician: Yes Home environment has been reviewed: home with spouse Prior level of function:: mod independent Prior/Current Home Services: No current home services Social Determinants of Health Reivew: SDOH reviewed no interventions necessary Readmission risk has been reviewed: Yes Transition of care needs: no transition of care needs at this time

## 2023-04-26 DIAGNOSIS — A419 Sepsis, unspecified organism: Secondary | ICD-10-CM | POA: Diagnosis not present

## 2023-04-26 DIAGNOSIS — R6520 Severe sepsis without septic shock: Secondary | ICD-10-CM | POA: Diagnosis not present

## 2023-04-26 MED ORDER — MELATONIN 5 MG PO TABS
5.0000 mg | ORAL_TABLET | Freq: Once | ORAL | Status: AC
  Administered 2023-04-26: 5 mg via ORAL
  Filled 2023-04-26: qty 1

## 2023-04-26 MED ORDER — LOSARTAN POTASSIUM 50 MG PO TABS
100.0000 mg | ORAL_TABLET | Freq: Every day | ORAL | Status: DC
  Administered 2023-04-26 – 2023-05-05 (×10): 100 mg via ORAL
  Filled 2023-04-26 (×10): qty 2

## 2023-04-26 MED ORDER — PROCHLORPERAZINE EDISYLATE 10 MG/2ML IJ SOLN
10.0000 mg | Freq: Four times a day (QID) | INTRAMUSCULAR | Status: DC | PRN
  Administered 2023-04-26: 10 mg via INTRAVENOUS
  Filled 2023-04-26: qty 2

## 2023-04-26 NOTE — Progress Notes (Signed)
[facility] PROGRESS NOTE  JINU HOKE (DOB: Dec 21, 1950) ZOX:[id number] PCP: Eartha Inch, MD  Brief Narrative: Devin Castro is a 72 y.o. male with a history of COPD, PE no longer anticoagulated, HTN, HLD who presented to the ED on 04/23/2023 with progressive dyspnea, cough, nasal congestion worsening over the previous week. He was wheezing and hypoxemic requiring BiPAP. WBC 22.7k, Cr 1.73 (baseline 0.85), lactic acid 2.1. RSV PCR positive, CXR showed patchy bibasilar opacities. IV fluids, antibiotics, and steroids were given and he was admitted to SDU for sepsis and respiratory failure due to RSV-induced AECOPD complicated by superimposed bacterial pneumonia.   Subjective: No real change to breathing, not better, not worse, still hasn't needed to go back on BiPAP but feels unwell. Some nausea overnight, still not eating much.  Objective: BP (!) 163/94   Pulse 94   Temp (!) 97.4 F (36.3 C) (Oral)   Resp (!) 25   Ht 5\' 10"  (1.778 m)   Wt 98.4 kg   SpO2 92%   BMI 31.13 kg/m   Gen: Elderly male mildly ill-appearing Pulm: Coarse,   CV: RRR, no MRG or pitting edema GI: Soft, NT, ND, +BS  Neuro: Alert and oriented. No new focal deficits. Ext: Warm, no deformities. Skin: No rashes, lesions or ulcers on visualized skin   Assessment & Plan: Severe sepsis due to bilateral lower lobe pneumonia, POA: Presenting with leukocytosis, tachycardia, tachypnea, hypoxia and AKI in setting of bilateral lower lobe pneumonia as seen on CXR.  RSV is positive and patient is being treated for potential secondary bacterial pneumonia. PCT 3.84, WBC 22.7k, this is likely cause for worsening later into disease course.  - Continue ceftriaxone, azithromycin. PCT declining but still elevated. WBC not likely to be reliable in setting of systemic steroids. - Send sputum culture (if able) - Continue ceftriaxone, azithromycin, culture monitoring (NGTD x3d).  - Urine antigens still pending. Between his  recent cruise, hyponatremia, GI symptoms, and slow to resolve, legionnaire's is not yet ruled out. - No further IVF required. LA has cleared. May need to reinitiate if not taking po today.   Acute hypoxic respiratory failure: Due to AECOPD, RSV bronchiolitis/pneumonitis, and superimposed bacterial PNA.  - Continue supplemental oxygen to maintain SpO2 >89% and normal respiratory effort while treating conditions as outlined. Remains on 10L  - Remains in SDU while still requiring high oxygen supplementation. - IS, FV  RSV infection: Supportive care, droplet precautions  AECOPD:  - Continue scheduled and prn bronchodilators.  - Continue IV solumedrol. Wheezing improved, but not yet resolved.   Bibasilar pneumonia:   AKI: SCr 1.7 >> 0.73 back to baseline.  - We held losartan, but BP is quite elevated and Cr normal, restart ARB this AM. - Avoid nephrotoxins, hypotension.    HTN:  - Continue norvasc, losartan  Tyrone Nine, MD [facility] [email or web address] 04/26/2023, 9:24 AM

## 2023-04-26 NOTE — Progress Notes (Signed)
Noted PT is slightly SOB at this time. RT offered to apply BiPAP - PT refused.

## 2023-04-26 NOTE — Plan of Care (Signed)
  Problem: Fluid Volume: Goal: Hemodynamic stability will improve Outcome: Progressing   Problem: Clinical Measurements: Goal: Diagnostic test results will improve Outcome: Progressing   Problem: Education: Goal: Knowledge of General Education information will improve Description: Including pain rating scale, medication(s)/side effects and non-pharmacologic comfort measures Outcome: Progressing   Problem: Health Behavior/Discharge Planning: Goal: Ability to manage health-related needs will improve Outcome: Progressing   Problem: Clinical Measurements: Goal: Ability to maintain clinical measurements within normal limits will improve Outcome: Progressing Goal: Will remain free from infection Outcome: Progressing Goal: Diagnostic test results will improve Outcome: Progressing Goal: Respiratory complications will improve Outcome: Progressing Goal: Cardiovascular complication will be avoided Outcome: Progressing   Problem: Activity: Goal: Risk for activity intolerance will decrease Outcome: Progressing   Problem: Nutrition: Goal: Adequate nutrition will be maintained Outcome: Progressing   Problem: Coping: Goal: Level of anxiety will decrease Outcome: Progressing   Problem: Elimination: Goal: Will not experience complications related to bowel motility Outcome: Progressing Goal: Will not experience complications related to urinary retention Outcome: Progressing   Problem: Pain Management: Goal: General experience of comfort will improve Outcome: Progressing   Problem: Safety: Goal: Ability to remain free from injury will improve Outcome: Progressing   Problem: Skin Integrity: Goal: Risk for impaired skin integrity will decrease Outcome: Progressing

## 2023-04-26 NOTE — Progress Notes (Signed)
Pt seen and given his scheduled nebulizer treatments which he tolerated well.  No increased wob / respiratory distress noted or voiced by patient at this time.  HR 79, rr21, spo2 92%.  Bipap not indicated but remains in room on standby.

## 2023-04-27 DIAGNOSIS — J441 Chronic obstructive pulmonary disease with (acute) exacerbation: Secondary | ICD-10-CM

## 2023-04-27 DIAGNOSIS — J9601 Acute respiratory failure with hypoxia: Secondary | ICD-10-CM | POA: Diagnosis not present

## 2023-04-27 DIAGNOSIS — J189 Pneumonia, unspecified organism: Secondary | ICD-10-CM

## 2023-04-27 DIAGNOSIS — N179 Acute kidney failure, unspecified: Secondary | ICD-10-CM

## 2023-04-27 DIAGNOSIS — I10 Essential (primary) hypertension: Secondary | ICD-10-CM

## 2023-04-27 DIAGNOSIS — A419 Sepsis, unspecified organism: Secondary | ICD-10-CM | POA: Diagnosis not present

## 2023-04-27 LAB — BASIC METABOLIC PANEL
Anion gap: 8 (ref 5–15)
BUN: 26 mg/dL — ABNORMAL HIGH (ref 8–23)
CO2: 24 mmol/L (ref 22–32)
Calcium: 8.2 mg/dL — ABNORMAL LOW (ref 8.9–10.3)
Chloride: 102 mmol/L (ref 98–111)
Creatinine, Ser: 0.76 mg/dL (ref 0.61–1.24)
GFR, Estimated: >60 mL/min (ref >60)
Glucose, Bld: 133 mg/dL — ABNORMAL HIGH (ref 70–99)
Potassium: 4.4 mmol/L (ref 3.5–5.1)
Sodium: 134 mmol/L — ABNORMAL LOW (ref 135–145)

## 2023-04-27 LAB — CBC
HCT: 42.9 % (ref 39.0–52.0)
Hemoglobin: 14 g/dL (ref 13.0–17.0)
MCH: 33.3 pg (ref 26.0–34.0)
MCHC: 32.6 g/dL (ref 30.0–36.0)
MCV: 101.9 fL — ABNORMAL HIGH (ref 80.0–100.0)
Platelets: 486 10*3/uL — ABNORMAL HIGH (ref 150–400)
RBC: 4.21 MIL/uL — ABNORMAL LOW (ref 4.22–5.81)
RDW: 12.8 % (ref 11.5–15.5)
WBC: 15.3 10*3/uL — ABNORMAL HIGH (ref 4.0–10.5)
nRBC: 0 % (ref 0.0–0.2)

## 2023-04-27 LAB — STREP PNEUMONIAE URINARY ANTIGEN: Strep Pneumo Urinary Antigen: NEGATIVE

## 2023-04-27 MED ORDER — GUAIFENESIN ER 600 MG PO TB12
1200.0000 mg | ORAL_TABLET | Freq: Two times a day (BID) | ORAL | Status: DC
  Administered 2023-04-27 – 2023-05-05 (×16): 1200 mg via ORAL
  Filled 2023-04-27 (×16): qty 2

## 2023-04-27 MED ORDER — MELATONIN 3 MG PO TABS
3.0000 mg | ORAL_TABLET | Freq: Every day | ORAL | Status: DC
  Administered 2023-04-27 – 2023-05-02 (×6): 3 mg via ORAL
  Filled 2023-04-27 (×6): qty 1

## 2023-04-27 MED ORDER — METHYLPREDNISOLONE SODIUM SUCC 125 MG IJ SOLR
60.0000 mg | Freq: Two times a day (BID) | INTRAMUSCULAR | Status: DC
Start: 2023-04-27 — End: 2023-05-02
  Administered 2023-04-27 – 2023-05-02 (×10): 60 mg via INTRAVENOUS
  Filled 2023-04-27 (×10): qty 2

## 2023-04-27 MED ORDER — LEVALBUTEROL HCL 0.63 MG/3ML IN NEBU
0.6300 mg | INHALATION_SOLUTION | Freq: Four times a day (QID) | RESPIRATORY_TRACT | Status: DC
  Administered 2023-04-27 – 2023-04-28 (×6): 0.63 mg via RESPIRATORY_TRACT
  Filled 2023-04-27 (×6): qty 3

## 2023-04-27 MED ORDER — IPRATROPIUM BROMIDE 0.02 % IN SOLN
0.5000 mg | Freq: Four times a day (QID) | RESPIRATORY_TRACT | Status: DC
  Administered 2023-04-27 – 2023-04-28 (×6): 0.5 mg via RESPIRATORY_TRACT
  Filled 2023-04-27 (×6): qty 2.5

## 2023-04-27 NOTE — Plan of Care (Signed)
  Problem: Fluid Volume: Goal: Hemodynamic stability will improve Outcome: Progressing   Problem: Clinical Measurements: Goal: Diagnostic test results will improve Outcome: Progressing Goal: Signs and symptoms of infection will decrease Outcome: Progressing   Problem: Respiratory: Goal: Ability to maintain adequate ventilation will improve Outcome: Progressing   Problem: Education: Goal: Knowledge of General Education information will improve Description: Including pain rating scale, medication(s)/side effects and non-pharmacologic comfort measures Outcome: Progressing   Problem: Health Behavior/Discharge Planning: Goal: Ability to manage health-related needs will improve Outcome: Progressing   Problem: Clinical Measurements: Goal: Ability to maintain clinical measurements within normal limits will improve Outcome: Progressing Goal: Diagnostic test results will improve Outcome: Progressing Goal: Respiratory complications will improve Outcome: Progressing Goal: Cardiovascular complication will be avoided Outcome: Progressing   Problem: Activity: Goal: Risk for activity intolerance will decrease Outcome: Progressing   Problem: Coping: Goal: Level of anxiety will decrease Outcome: Progressing   Problem: Elimination: Goal: Will not experience complications related to bowel motility Outcome: Progressing Goal: Will not experience complications related to urinary retention Outcome: Progressing   Problem: Pain Management: Goal: General experience of comfort will improve Outcome: Progressing   Problem: Safety: Goal: Ability to remain free from injury will improve Outcome: Progressing   Problem: Skin Integrity: Goal: Risk for impaired skin integrity will decrease Outcome: Progressing

## 2023-04-27 NOTE — Evaluation (Signed)
Physical Therapy Evaluation Patient Details Name: Devin Castro MRN: [id number] DOB: October 20, 1950 Today's Date: 04/27/2023  History of Present Illness  72 yo male admitted 04/23/23 with Severe sepsis due to bilateral lower lobe pneumonia, leukocytosis, tachycardia, tachypnea, hypoxia and AKI in setting of bilateral lower lobe pneumonia as seen on CXR.  RSV is positive and patient is being treated for potential secondary bacterial pneumonia. PMH: COPD,HTN,PE, Bil. TKA  Clinical Impression  Pt admitted with above diagnosis.  Pt currently with functional limitations due to the deficits listed below (see PT Problem List). Pt will benefit from acute skilled PT to increase their independence and safety with mobility to allow discharge.     The patient is eager to progress activity, limited with DYSpnea, able to step forward and backward x 1. Dyspnea   4/4, SPO2 on 13 LHFNC 83%, recovered to 88/%. Patient seated in recliner.   Patient should progress to return home as respiratory issues improve.      If plan is discharge home, recommend the following: A little help with walking and/or transfers;Assistance with cooking/housework;Assist for transportation;Help with stairs or ramp for entrance   Can travel by private vehicle    yes    Equipment Recommendations None recommended by PT  Recommendations for Other Services    OT   Functional Status Assessment Patient has had a recent decline in their functional status and demonstrates the ability to make significant improvements in function in a reasonable and predictable amount of time.     Precautions / Restrictions Precautions Precaution Comments: on  13 L HFNC      Mobility  Bed Mobility               General bed mobility comments: in recliner    Transfers Overall transfer level: Needs assistance Equipment used: Rolling walker (2 wheels) Transfers: Sit to/from Stand Sit to Stand: Contact guard assist           General  transfer comment: stands at Rw    Ambulation/Gait     Assistive device: Rolling walker (2 wheels)         General Gait Details: able to step 4 steps. limited with SOB and lines  Stairs            Wheelchair Mobility     Tilt Bed    Modified Rankin (Stroke Patients Only)       Balance Overall balance assessment: Mild deficits observed, not formally tested                                           Pertinent Vitals/Pain Pain Assessment Pain Assessment: No/denies pain    Home Living Family/patient expects to be discharged to:: Private residence Living Arrangements: Spouse/significant other Available Help at Discharge: Family;Available 24 hours/day Type of Home: House Home Access: Stairs to enter Entrance Stairs-Rails: Right Entrance Stairs-Number of Steps: 2   Home Layout: One level Home Equipment: Agricultural consultant (2 wheels)      Prior Function Prior Level of Function : Independent/Modified Independent;Working/employed;Driving             Mobility Comments: truck Glass blower/designer Extremity Assessment Upper Extremity Assessment: Overall WFL for tasks assessed    Lower Extremity Assessment Lower Extremity Assessment: Overall WFL for tasks assessed    Cervical / Trunk Assessment Cervical / Trunk  Assessment: Normal  Communication   Communication Communication: No apparent difficulties  Cognition Arousal: Alert Behavior During Therapy: WFL for tasks assessed/performed Overall Cognitive Status: Within Functional Limits for tasks assessed                                          General Comments      Exercises     Assessment/Plan    PT Assessment Patient needs continued PT services  PT Problem List Decreased mobility;Cardiopulmonary status limiting activity;Decreased activity tolerance;Decreased knowledge of use of DME       PT Treatment Interventions DME  instruction;Therapeutic activities;Gait training;Therapeutic exercise;Patient/family education;Functional mobility training    PT Goals (Current goals can be found in the Care Plan section)  Acute Rehab PT Goals Patient Stated Goal: get back to work PT Goal Formulation: With patient Time For Goal Achievement: 05/11/23 Potential to Achieve Goals: Good    Frequency Min 1X/week     Co-evaluation               AM-PAC PT "6 Clicks" Mobility  Outcome Measure Help needed turning from your back to your side while in a flat bed without using bedrails?: A Little Help needed moving from lying on your back to sitting on the side of a flat bed without using bedrails?: A Little Help needed moving to and from a bed to a chair (including a wheelchair)?: A Little Help needed standing up from a chair using your arms (e.g., wheelchair or bedside chair)?: A Little Help needed to walk in hospital room?: Total Help needed climbing 3-5 steps with a railing? : Total 6 Click Score: 14    End of Session   Activity Tolerance: Treatment limited secondary to medical complications (Comment) Patient left: in chair;with call bell/phone within reach Nurse Communication: Mobility status PT Visit Diagnosis: Unsteadiness on feet (R26.81);Difficulty in walking, not elsewhere classified (R26.2)    Time: 9147-8295 PT Time Calculation (min) (ACUTE ONLY): 19 min   Charges:   PT Evaluation $PT Eval Low Complexity: 1 Low   PT General Charges $$ ACUTE PT VISIT: 1 Visit         Blanchard Kelch PT Acute Rehabilitation Services Office [phone] Weekend [phone]   Rada Hay 04/27/2023, 5:23 PM

## 2023-04-27 NOTE — Progress Notes (Signed)
PROGRESS NOTE    Devin Castro  ZOX:[id number] DOB: November 14, 1950 DOA: 04/23/2023 PCP: Eartha Inch, MD   Brief Narrative:  Devin Castro is a 72 y.o. male with medical history significant for COPD, Hx of provoked PE in 2016 completed 6 months Xarelto, HTN, HLD who is admitted with severe sepsis and acute hypoxic respiratory failure due to bilateral lower lobe pneumonia and COPD exacerbation and was also found to have RSV infection.  Currently he is slowly improving but continues to still require quite a bit of oxygen.  Will continue oxygen weaning and have made further medication adjustments.  Assessment and Plan:  Severe sepsis due to bilateral lower lobe pneumonia, POA: -Presenting with leukocytosis, tachycardia, tachypnea, hypoxia and AKI in setting of bilateral lower lobe pneumonia as seen on CXR.   -RSV is positive and patient is being treated for potential secondary bacterial pneumonia.  -PCT 3.84 -> 2.65 -WBC Trend: Recent Labs  Lab 04/23/23 1947 04/24/23 0614 04/27/23 0248  WBC 22.7* 19.3* 15.3*  -Continue ceftriaxone, azithromycin. PCT declining but still elevated. WBC not likely to be reliable in setting of systemic steroids. -Lactic Acid Level Trend: Recent Labs  Lab 04/23/23 1958 04/23/23 2201  LATICACIDVEN 2.1* 1.4  -Send sputum culture (if able) -Continue IV Ceftriaxone, Azithromycin, -C/w Blood Culture monitoring (NGTD x 4 Days).  -Urine antigens still pending with urine Legionella Pneumophila Serogp 1 Ur Ag and Strep Pneumoniae Urine Antigen. Between his recent cruise, hyponatremia, GI symptoms, and slow to resolve, legionnaire's is not yet ruled out. - No further IVF required. LA has cleared. May need to reinitiate if not taking po today. -Repeat CXR in the AM   Acute Respiratory Failure with Hypoxia Due to AECOPD, RSV bronchiolitis/pneumonitis, and superimposed bacterial PNA.  -Continue supplemental oxygen to maintain SpO2 >89% and normal respiratory effort  while treating conditions as outlined. Remains on 12 Liters SpO2: 92 % O2 Flow Rate (L/min): 12 L/min FiO2 (%): 50 % -Remains in SDU while still requiring high oxygen supplementation. -C/w  IS, FV and will Increase Guaifenesin 1200 mg po BID and continue Benzonatate 100 mg po PRN Cough -C/w arformoterol 15 mcg nebs twice daily, budesonide 0.25 mg nebs twice daily, and DuoNeb 3 mL every 6 as needed for wheezing or shortness of breath and will add Xopenex and Atrovent scheduled every 6h -Continue with IV steroids and increase the dose from 40 mg every 12 to 60 mg every 12 -Continue to monitor respiratory status carefully and continue supplemental oxygen via nasal cannula and wean O2 as tolerated-continuous pulse oximetry maintain O2 saturation greater 90% -Will need an ambulatory home O2 screen prior to discharge-repeat chest x-ray in a.m.   RSV infection -Supportive care, droplet precautions   AECOPD -Continue scheduled and prn bronchodilators as above.  -Continue IV solumedrol. Wheezing improved, but not yet resolved.    Bibasilar Pneumonia -See above -Will need a repeat CXR in the AM    AKI -BUN/Cr Trend: Recent Labs  Lab 04/23/23 1947 04/24/23 0614 04/25/23 0258 04/27/23 0248  BUN 41* 34* 29* 26*  CREATININE 1.73* 1.17 0.73 0.76  -Initially held losartan, but BP is quite elevated and Cr normal, restarted ARB with Losartan 100 mg po Daily -Avoid Nephrotoxic Medications, Contrast Dyes, Hypotension and Dehydration to Ensure Adequate Renal Perfusion and will need to Renally Adjust Meds -Continue to Monitor and Trend Renal Function carefully and repeat CMP in the AM   Essential Hypertension -Continue with Amlodipine 10 mg p.o. daily, Losartan 100 g  p.o. daily -Continue to Monitor blood pressures per protocol -Last BP reading still elevated at 167/91  Hyponatremia -Na+ Trend: Recent Labs  Lab 04/23/23 1947 04/24/23 0614 04/25/23 0258 04/27/23 0248  NA 131* 136 137 134*   -Continue to Monitor and Trend  -Repeat CMP in the AM   Abnormal LFTs, improved -AST and ALT Trend: Recent Labs  Lab 04/23/23 1947 04/24/23 0614  AST 45* 24  ALT 54* 42  -Improved. Continue to Monitor and Trend and repeat CMP in the AM and if worsens again we will need a right upper ultrasound and acute hepatitis panel  Macrocytic Anemia -? Spurious Result -Hgb/Hct Trend: Recent Labs  Lab 04/23/23 1947 04/24/23 0614 04/27/23 0248  HGB 15.0 12.0* 14.0  HCT 43.5 35.4* 42.9  MCV 98.0 102.0* 101.9*  -Check Anemia Panel in the AM -Continue to Monitor for S/Sx of Bleeding; No overt bleeding noted -Repeat CBC in the AM   Hypoalbuminemia -Patient's Albumin Trend: Recent Labs  Lab 04/23/23 1947 04/24/23 0614  ALBUMIN 2.9* 2.2*  -Continue to Monitor and Trend and repeat CMP in the AM  Class I Obesity -Complicates overall prognosis and care -Estimated body mass index is 31.13 kg/m as calculated from the following:   Height as of this encounter: 5\' 10"  (1.778 m).   Weight as of this encounter: 98.4 kg.  -Weight Loss and Dietary Counseling given  DVT prophylaxis: enoxaparin (LOVENOX) injection 40 mg Start: 04/23/23 2200    Code Status: Full Code Family Communication: No family present at bedside   Disposition Plan:  Level of care: Stepdown Status is: Inpatient Remains inpatient appropriate because: Needs further clinical improvement given that he continues to have a high oxygen requirement   Consultants:  None  Procedures:  As delineated as above  Antimicrobials:  Anti-infectives (From admission, onward)    Start     Dose/Rate Route Frequency Ordered Stop   04/24/23 0800  cefTRIAXone (ROCEPHIN) 2 g in sodium chloride 0.9 % 100 mL IVPB        2 g 200 mL/hr over 30 Minutes Intravenous Every 24 hours 04/23/23 2136 04/29/23 0759   04/23/23 2015  azithromycin (ZITHROMAX) 500 mg in sodium chloride 0.9 % 250 mL IVPB        500 mg 250 mL/hr over 60 Minutes  Intravenous Every 24 hours 04/23/23 2000 04/28/23 1959   04/23/23 2015  cefTRIAXone (ROCEPHIN) 1 g in sodium chloride 0.9 % 100 mL IVPB        1 g 200 mL/hr over 30 Minutes Intravenous  Once 04/23/23 2004 04/23/23 2031   04/23/23 2000  cefTRIAXone (ROCEPHIN) injection 1 g  Status:  Discontinued        1 g Intramuscular  Once 04/23/23 1957 04/23/23 2004       Subjective: Seen and examined at bedside and thinks he is doing better from a respiratory standpoint and states that he is much improved significantly but continues to require supplemental oxygen and is on 12 L today.  Will need to continue to wean his supplemental oxygen as he still requires quite a bit of oxygen.  Denies any other concerns or complaints this time.  Objective: Vitals:   04/27/23 0358 04/27/23 0400 04/27/23 0500 04/27/23 0745  BP: (!) 147/77 (!) 147/77 (!) 167/91   Pulse: 71 85 80   Resp: 20 (!) 22 (!) 27   Temp: 97.8 F (36.6 C)   97.9 F (36.6 C)  TempSrc: Oral   Oral  SpO2: 90% 90%  92%   Weight:      Height:        Intake/Output Summary (Last 24 hours) at 04/27/2023 1205 Last data filed at 04/27/2023 0981 Gross per 24 hour  Intake 474.88 ml  Output 3650 ml  Net -3175.12 ml   Filed Weights   04/24/23 0300 04/24/23 0350 04/25/23 0500  Weight: 98.1 kg 98.1 kg 98.4 kg   Examination: Physical Exam:  Constitutional: WN/WD obese Caucasian elderly male in no acute distress Respiratory: Diminished to auscultation bilaterally with some coarse breath sounds and does have some wheezing and some rhonchi but no appreciable rales or crackles. Normal respiratory effort and patient is not tachypenic. No accessory muscle use.  Wearing supplemental oxygen via nasal cannula is on 12 L Cardiovascular: RRR, no murmurs / rubs / gallops. S1 and S2 auscultated.  Trace extremity edema Abdomen: Soft, non-tender, distended secondary body habitus. Bowel sounds positive.  GU: Deferred. Musculoskeletal: No clubbing / cyanosis  of digits/nails. No joint deformity upper and lower extremities. Skin: No rashes, lesions, ulcers on limited skin evaluation. No induration; Warm and dry.  Neurologic: CN 2-12 grossly intact with no focal deficits. Romberg sign and cerebellar reflexes not assessed.  Psychiatric: Normal judgment and insight. Alert and oriented x 3. Normal mood and appropriate affect.   Data Reviewed: I have personally reviewed following labs and imaging studies  CBC: Recent Labs  Lab 04/23/23 1947 04/24/23 0614 04/27/23 0248  WBC 22.7* 19.3* 15.3*  HGB 15.0 12.0* 14.0  HCT 43.5 35.4* 42.9  MCV 98.0 102.0* 101.9*  PLT 422* 295 486*   Basic Metabolic Panel: Recent Labs  Lab 04/23/23 1947 04/24/23 0614 04/25/23 0258 04/27/23 0248  NA 131* 136 137 134*  K 4.0 4.0 4.3 4.4  CL 95* 104 104 102  CO2 21* 21* 23 24  GLUCOSE 147* 194* 173* 133*  BUN 41* 34* 29* 26*  CREATININE 1.73* 1.17 0.73 0.76  CALCIUM 8.2* 7.4* 8.5* 8.2*   GFR: Estimated Creatinine Clearance: 101.1 mL/min (by C-G formula based on SCr of 0.76 mg/dL). Liver Function Tests: Recent Labs  Lab 04/23/23 1947 04/24/23 0614  AST 45* 24  ALT 54* 42  ALKPHOS 71 56  BILITOT 1.5* 0.7  PROT 8.2* 6.6  ALBUMIN 2.9* 2.2*   No results for input(s): "LIPASE", "AMYLASE" in the last 168 hours. No results for input(s): "AMMONIA" in the last 168 hours. Coagulation Profile: No results for input(s): "INR", "PROTIME" in the last 168 hours. Cardiac Enzymes: No results for input(s): "CKTOTAL", "CKMB", "CKMBINDEX", "TROPONINI" in the last 168 hours. BNP (last 3 results) No results for input(s): "PROBNP" in the last 8760 hours. HbA1C: No results for input(s): "HGBA1C" in the last 72 hours. CBG: No results for input(s): "GLUCAP" in the last 168 hours. Lipid Profile: No results for input(s): "CHOL", "HDL", "LDLCALC", "TRIG", "CHOLHDL", "LDLDIRECT" in the last 72 hours. Thyroid Function Tests: No results for input(s): "TSH", "T4TOTAL",  "FREET4", "T3FREE", "THYROIDAB" in the last 72 hours. Anemia Panel: No results for input(s): "VITAMINB12", "FOLATE", "FERRITIN", "TIBC", "IRON", "RETICCTPCT" in the last 72 hours. Sepsis Labs: Recent Labs  Lab 04/23/23 1958 04/23/23 2201 04/24/23 0614 04/25/23 0258  PROCALCITON  --   --  3.84 2.65  LATICACIDVEN 2.1* 1.4  --   --    Recent Results (from the past 240 hour(s))  Resp panel by RT-PCR (RSV, Flu A&B, Covid) Anterior Nasal Swab     Status: Abnormal   Collection Time: 04/23/23  7:47 PM   Specimen: Anterior  Nasal Swab  Result Value Ref Range Status   SARS Coronavirus 2 by RT PCR NEGATIVE NEGATIVE Final    Comment: (NOTE) SARS-CoV-2 target nucleic acids are NOT DETECTED.  The SARS-CoV-2 RNA is generally detectable in upper respiratory specimens during the acute phase of infection. The lowest concentration of SARS-CoV-2 viral copies this assay can detect is 138 copies/mL. A negative result does not preclude SARS-Cov-2 infection and should not be used as the sole basis for treatment or other patient management decisions. A negative result may occur with  improper specimen collection/handling, submission of specimen other than nasopharyngeal swab, presence of viral mutation(s) within the areas targeted by this assay, and inadequate number of viral copies(<138 copies/mL). A negative result must be combined with clinical observations, patient history, and epidemiological information. The expected result is Negative.  Fact Sheet for Patients:  BloggerCourse.com  Fact Sheet for Healthcare Providers:  SeriousBroker.it  This test is no t yet approved or cleared by the [place] FDA and  has been authorized for detection and/or diagnosis of SARS-CoV-2 by FDA under an Emergency Use Authorization (EUA). This EUA will remain  in effect (meaning this test can be used) for the duration of the COVID-19 declaration under Section  564(b)(1) of the Act, 21 U.S.C.section 360bbb-3(b)(1), unless the authorization is terminated  or revoked sooner.       Influenza A by PCR NEGATIVE NEGATIVE Final   Influenza B by PCR NEGATIVE NEGATIVE Final    Comment: (NOTE) The Xpert Xpress SARS-CoV-2/FLU/RSV plus assay is intended as an aid in the diagnosis of influenza from Nasopharyngeal swab specimens and should not be used as a sole basis for treatment. Nasal washings and aspirates are unacceptable for Xpert Xpress SARS-CoV-2/FLU/RSV testing.  Fact Sheet for Patients: BloggerCourse.com  Fact Sheet for Healthcare Providers: SeriousBroker.it  This test is not yet approved or cleared by the [place] FDA and has been authorized for detection and/or diagnosis of SARS-CoV-2 by FDA under an Emergency Use Authorization (EUA). This EUA will remain in effect (meaning this test can be used) for the duration of the COVID-19 declaration under Section 564(b)(1) of the Act, 21 U.S.C. section 360bbb-3(b)(1), unless the authorization is terminated or revoked.     Resp Syncytial Virus by PCR POSITIVE (A) NEGATIVE Final    Comment: (NOTE) Fact Sheet for Patients: BloggerCourse.com  Fact Sheet for Healthcare Providers: SeriousBroker.it  This test is not yet approved or cleared by the [place] FDA and has been authorized for detection and/or diagnosis of SARS-CoV-2 by FDA under an Emergency Use Authorization (EUA). This EUA will remain in effect (meaning this test can be used) for the duration of the COVID-19 declaration under Section 564(b)(1) of the Act, 21 U.S.C. section 360bbb-3(b)(1), unless the authorization is terminated or revoked.  Performed at [facility], [address], [place], [place] [address]   Blood culture (routine x 2)     Status: None (Preliminary result)   Collection Time: 04/23/23   7:58 PM   Specimen: BLOOD  Result Value Ref Range Status   Specimen Description   Final    BLOOD LEFT ANTECUBITAL Performed at [facility], [address], [place], [place] [address]    Special Requests   Final    BOTTLES DRAWN AEROBIC AND ANAEROBIC Blood Culture adequate volume Performed at [facility], [address], [place], [place] [address]    Culture   Final    NO GROWTH 4 DAYS Performed at [facility], [address], [place], [place] [address]    Report Status PENDING  Incomplete  Blood culture (routine x 2)     Status: None (Preliminary result)   Collection Time: 04/23/23  8:10 PM   Specimen: BLOOD LEFT FOREARM  Result Value Ref Range Status   Specimen Description   Final    BLOOD LEFT FOREARM Performed at [facility], [address], [place], [place] [address]    Special Requests   Final    BOTTLES DRAWN AEROBIC AND ANAEROBIC Blood Culture results may not be optimal due to an inadequate volume of blood received in culture bottles Performed at [facility], [address], [place], [place] [address]    Culture   Final    NO GROWTH 4 DAYS Performed at [facility], [address], [place], [place] [address]    Report Status PENDING  Incomplete    Radiology Studies: No results found.  Scheduled Meds:  amLODipine  10 mg Oral QPM   arformoterol  15 mcg Nebulization BID   budesonide (PULMICORT) nebulizer solution  0.25 mg Nebulization BID   Chlorhexidine Gluconate Cloth  6 each Topical Daily   enoxaparin (LOVENOX) injection  40 mg Subcutaneous Q24H   guaiFENesin  1,200 mg Oral BID   ipratropium  0.5 mg Nebulization Q6H   levalbuterol  0.63 mg Nebulization Q6H   losartan  100 mg Oral Daily   methylPREDNISolone (SOLU-MEDROL) injection  60 mg Intravenous Q12H   mouth rinse  15 mL Mouth Rinse 4 times per day   sodium chloride flush  10 mL Intravenous Q12H   sodium chloride flush  3 mL  Intravenous Q12H   Continuous Infusions:  azithromycin Stopped (04/26/23 2225)   cefTRIAXone (ROCEPHIN)  IV Stopped (04/27/23 0836)    LOS: 4 days   Marguerita Merles, DO [facility] Available via Epic secure chat 7am-7pm After these hours, please refer to coverage provider listed on [email or web address] 04/27/2023, 12:05 PM

## 2023-04-27 NOTE — Plan of Care (Signed)
  Problem: Education: Goal: Knowledge of General Education information will improve Description: Including pain rating scale, medication(s)/side effects and non-pharmacologic comfort measures Outcome: Progressing   Problem: Health Behavior/Discharge Planning: Goal: Ability to manage health-related needs will improve Outcome: Progressing   Problem: Nutrition: Goal: Adequate nutrition will be maintained Outcome: Progressing   Problem: Respiratory: Goal: Ability to maintain adequate ventilation will improve Outcome: Not Progressing   Problem: Activity: Goal: Risk for activity intolerance will decrease Outcome: Not Progressing

## 2023-04-28 ENCOUNTER — Inpatient Hospital Stay (HOSPITAL_COMMUNITY): Payer: PPO

## 2023-04-28 DIAGNOSIS — J189 Pneumonia, unspecified organism: Secondary | ICD-10-CM | POA: Diagnosis not present

## 2023-04-28 DIAGNOSIS — J441 Chronic obstructive pulmonary disease with (acute) exacerbation: Secondary | ICD-10-CM | POA: Diagnosis not present

## 2023-04-28 DIAGNOSIS — A419 Sepsis, unspecified organism: Secondary | ICD-10-CM | POA: Diagnosis not present

## 2023-04-28 DIAGNOSIS — J121 Respiratory syncytial virus pneumonia: Secondary | ICD-10-CM

## 2023-04-28 DIAGNOSIS — J449 Chronic obstructive pulmonary disease, unspecified: Secondary | ICD-10-CM

## 2023-04-28 DIAGNOSIS — J9601 Acute respiratory failure with hypoxia: Secondary | ICD-10-CM | POA: Diagnosis not present

## 2023-04-28 LAB — COMPREHENSIVE METABOLIC PANEL
ALT: 38 U/L (ref 0–44)
AST: 21 U/L (ref 15–41)
Albumin: 2.4 g/dL — ABNORMAL LOW (ref 3.5–5.0)
Alkaline Phosphatase: 48 U/L (ref 38–126)
Anion gap: 10 (ref 5–15)
BUN: 23 mg/dL (ref 8–23)
CO2: 22 mmol/L (ref 22–32)
Calcium: 8.3 mg/dL — ABNORMAL LOW (ref 8.9–10.3)
Chloride: 103 mmol/L (ref 98–111)
Creatinine, Ser: 0.67 mg/dL (ref 0.61–1.24)
GFR, Estimated: >60 mL/min (ref >60)
Glucose, Bld: 138 mg/dL — ABNORMAL HIGH (ref 70–99)
Potassium: 4.3 mmol/L (ref 3.5–5.1)
Sodium: 135 mmol/L (ref 135–145)
Total Bilirubin: 0.7 mg/dL (ref <1.2)
Total Protein: 6.5 g/dL (ref 6.5–8.1)

## 2023-04-28 LAB — IRON AND TIBC
Iron: 47 ug/dL (ref 45–182)
Saturation Ratios: 25 % (ref 17.9–39.5)
TIBC: 190 ug/dL — ABNORMAL LOW (ref 250–450)
UIBC: 143 ug/dL

## 2023-04-28 LAB — MAGNESIUM: Magnesium: 2.1 mg/dL (ref 1.7–2.4)

## 2023-04-28 LAB — CBC WITH DIFFERENTIAL/PLATELET
Abs Immature Granulocytes: 0.79 10*3/uL — ABNORMAL HIGH (ref 0.00–0.07)
Basophils Absolute: 0.1 10*3/uL (ref 0.0–0.1)
Basophils Relative: 1 %
Eosinophils Absolute: 0 10*3/uL (ref 0.0–0.5)
Eosinophils Relative: 0 %
HCT: 42.5 % (ref 39.0–52.0)
Hemoglobin: 14.2 g/dL (ref 13.0–17.0)
Immature Granulocytes: 5 %
Lymphocytes Relative: 2 %
Lymphs Abs: 0.3 10*3/uL — ABNORMAL LOW (ref 0.7–4.0)
MCH: 34.1 pg — ABNORMAL HIGH (ref 26.0–34.0)
MCHC: 33.4 g/dL (ref 30.0–36.0)
MCV: 102.2 fL — ABNORMAL HIGH (ref 80.0–100.0)
Monocytes Absolute: 0.3 10*3/uL (ref 0.1–1.0)
Monocytes Relative: 2 %
Neutro Abs: 14 10*3/uL — ABNORMAL HIGH (ref 1.7–7.7)
Neutrophils Relative %: 90 %
Platelets: 503 10*3/uL — ABNORMAL HIGH (ref 150–400)
RBC: 4.16 MIL/uL — ABNORMAL LOW (ref 4.22–5.81)
RDW: 12.7 % (ref 11.5–15.5)
WBC: 15.5 10*3/uL — ABNORMAL HIGH (ref 4.0–10.5)
nRBC: 0 % (ref 0.0–0.2)

## 2023-04-28 LAB — CULTURE, BLOOD (ROUTINE X 2)
Culture: NO GROWTH
Special Requests: ADEQUATE

## 2023-04-28 LAB — RETICULOCYTES
Immature Retic Fract: 12.4 % (ref 2.3–15.9)
RBC.: 4.21 MIL/uL — ABNORMAL LOW (ref 4.22–5.81)
Retic Count, Absolute: 42.1 10*3/uL (ref 19.0–186.0)
Retic Ct Pct: 1 % (ref 0.4–3.1)

## 2023-04-28 LAB — PROCALCITONIN: Procalcitonin: 0.13 ng/mL

## 2023-04-28 LAB — FOLATE: Folate: 6.1 ng/mL (ref >5.9)

## 2023-04-28 LAB — PHOSPHORUS: Phosphorus: 3.1 mg/dL (ref 2.5–4.6)

## 2023-04-28 LAB — FERRITIN: Ferritin: 567 ng/mL — ABNORMAL HIGH (ref 24–336)

## 2023-04-28 LAB — VITAMIN B12: Vitamin B-12: 1366 pg/mL — ABNORMAL HIGH (ref 180–914)

## 2023-04-28 MED ORDER — LEVALBUTEROL HCL 0.63 MG/3ML IN NEBU
0.6300 mg | INHALATION_SOLUTION | Freq: Three times a day (TID) | RESPIRATORY_TRACT | Status: DC
Start: 2023-04-29 — End: 2023-05-02
  Administered 2023-04-29 – 2023-05-02 (×10): 0.63 mg via RESPIRATORY_TRACT
  Filled 2023-04-28 (×10): qty 3

## 2023-04-28 MED ORDER — BUDESONIDE 0.5 MG/2ML IN SUSP
0.5000 mg | Freq: Two times a day (BID) | RESPIRATORY_TRACT | Status: DC
  Administered 2023-04-28 – 2023-05-03 (×10): 0.5 mg via RESPIRATORY_TRACT
  Filled 2023-04-28 (×9): qty 2

## 2023-04-28 MED ORDER — IPRATROPIUM BROMIDE 0.02 % IN SOLN
0.5000 mg | Freq: Three times a day (TID) | RESPIRATORY_TRACT | Status: DC
Start: 2023-04-29 — End: 2023-05-02
  Administered 2023-04-29 – 2023-05-02 (×10): 0.5 mg via RESPIRATORY_TRACT
  Filled 2023-04-28 (×10): qty 2.5

## 2023-04-28 NOTE — Evaluation (Signed)
Occupational Therapy Evaluation Patient Details Name: Devin Castro MRN: [id number] DOB: 06/29/1950 Today's Date: 04/28/2023   History of Present Illness 72 yo male admitted 04/23/23 with Severe sepsis due to bilateral lower lobe pneumonia, leukocytosis, tachycardia, tachypnea, hypoxia and AKI in setting of bilateral lower lobe pneumonia as seen on CXR.  RSV is positive and patient is being treated for potential secondary bacterial pneumonia. PMH: COPD,HTN,PE, Bil. TKA   Clinical Impression   PTA, pt lives with spouse, typically completely independent and works part time as a [employer] architect. Pt presents now with deficits in cardiopulmonary endurance and balance. Pt able to mobilize to/from bathroom and manage standing ADLs with no more than CGA. Emphasis on energy conservation (handout provided), breathing techniques and vitals monitoring. Anticipate no OT needs at DC but will continue to follow acutely.   SpO2 desat to 85% on 15 L O2, able to recover to 89-90% within 2 min seated rest break HR to 123bpm with activity      If plan is discharge home, recommend the following: A little help with bathing/dressing/bathroom;Assistance with cooking/housework    Functional Status Assessment  Patient has had a recent decline in their functional status and demonstrates the ability to make significant improvements in function in a reasonable and predictable amount of time.  Equipment Recommendations  None recommended by OT    Recommendations for Other Services       Precautions / Restrictions Precautions Precautions: Fall;Other (comment) Precaution Comments: monitor O2 Restrictions Weight Bearing Restrictions Per Provider Order: No      Mobility Bed Mobility Overal bed mobility: Modified Independent                  Transfers Overall transfer level: Needs assistance Equipment used: None Transfers: Sit to/from Stand Sit to Stand: Supervision                  Balance  Overall balance assessment: Mild deficits observed, not formally tested                                         ADL either performed or assessed with clinical judgement   ADL Overall ADL's : Needs assistance/impaired Eating/Feeding: Independent   Grooming: Supervision/safety;Sitting;Standing;Oral care Grooming Details (indicate cue type and reason): standing to brush teeth w/ rest break needed afterwards. able to shave face sitting a sink without issues - OT assisted with O2 line mgmt Upper Body Bathing: Set up   Lower Body Bathing: Contact guard assist   Upper Body Dressing : Set up   Lower Body Dressing: Contact guard assist   Toilet Transfer: Contact guard assist;Ambulation   Toileting- Clothing Manipulation and Hygiene: Supervision/safety;Sitting/lateral lean;Sit to/from stand       Functional mobility during ADLs: Contact guard assist General ADL Comments: able to mobilize to/from bathroom without AD with CGA for ADLs. focus on breathing techniques, vitals monitoring, energy conservation education (handout provided) and encouraged to purchase pulse ox for O2 monitoring at home     Vision Baseline Vision/History: 1 Wears glasses Ability to See in Adequate Light: 0 Adequate Patient Visual Report: No change from baseline Vision Assessment?: No apparent visual deficits     Perception         Praxis         Pertinent Vitals/Pain Pain Assessment Pain Assessment: No/denies pain     Extremity/Trunk Assessment Upper Extremity Assessment Upper Extremity  Assessment: Overall WFL for tasks assessed;Right hand dominant   Lower Extremity Assessment Lower Extremity Assessment: Defer to PT evaluation   Cervical / Trunk Assessment Cervical / Trunk Assessment: Normal   Communication Communication Communication: No apparent difficulties   Cognition Arousal: Alert Behavior During Therapy: WFL for tasks assessed/performed Overall Cognitive Status: Within  Functional Limits for tasks assessed                                       General Comments  Wife at bedside    Exercises     Shoulder Instructions      Home Living Family/patient expects to be discharged to:: Private residence Living Arrangements: Spouse/significant other Available Help at Discharge: Family;Available 24 hours/day Type of Home: House Home Access: Stairs to enter [employer] of Steps: 2 Entrance Stairs-Rails: Right Home Layout: One level     Bathroom Shower/Tub: Producer, television/film/video: Handicapped height Bathroom Accessibility: Yes   Home Equipment: Shower seat - built in          Prior Functioning/Environment Prior Level of Function : Independent/Modified Independent;Working/employed;Driving             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: Decreased activity tolerance;Cardiopulmonary status limiting activity      OT Treatment/Interventions: Self-care/ADL training;Therapeutic exercise;Energy conservation;DME and/or AE instruction;Therapeutic activities    OT Goals(Current goals can be found in the care plan section) Acute Rehab OT Goals Patient Stated Goal: improve breathing and wean off of O2 OT Goal Formulation: With patient Time For Goal Achievement: 05/12/23 Potential to Achieve Goals: Good  OT Frequency: Min 1X/week    Co-evaluation              AM-PAC OT "6 Clicks" Daily Activity     Outcome Measure Help from another person eating meals?: None Help from another person taking care of personal grooming?: A Little Help from another person toileting, which includes using toliet, bedpan, or urinal?: A Little Help from another person bathing (including washing, rinsing, drying)?: A Little Help from another person to put on and taking off regular upper body clothing?: A Little Help from another person to put on and taking off regular lower body clothing?: A Little 6 Click  Score: 19   End of Session Equipment Utilized During Treatment: Gait belt;Oxygen Nurse Communication: Mobility status  Activity Tolerance: Patient tolerated treatment well Patient left: in chair;with call bell/phone within reach;with family/visitor present  OT Visit Diagnosis: Unsteadiness on feet (R26.81);Other (comment) (decreased cardiopulmonary tolerance)                Time: 0950-1030 OT Time Calculation (min): 40 min Charges:  OT General Charges $OT Visit: 1 Visit OT Evaluation $OT Eval Moderate Complexity: 1 Mod OT Treatments $Self Care/Home Management : 8-22 mins $Therapeutic Activity: 8-22 mins  Bradd Canary, OTR/L Acute Rehab Services Office: [phone]   Lorre Munroe 04/28/2023, 12:11 PM

## 2023-04-28 NOTE — Progress Notes (Signed)
PROGRESS NOTE    Devin Castro  ZOX:[id number] DOB: April 17, 1951 DOA: 04/23/2023 PCP: Eartha Inch, MD   Brief Narrative:  Devin Castro is a 72 y.o. male with medical history significant for COPD, Hx of provoked PE in 2016 completed 6 months Xarelto, HTN, HLD who is admitted with severe sepsis and acute hypoxic respiratory failure due to bilateral lower lobe pneumonia and COPD exacerbation and was also found to have RSV infection.  Currently he is slowly improving but continues to still require quite a bit of oxygen.  Will continue oxygen weaning and have made further medication adjustments but his oxygen is worsening.  Chest x-ray also worsened so pulmonary has been consulted for further evaluation and recommending continuing supportive care and continue current antibiotics.  PCCM did discuss with him about BiPAP support if needed and then if unable to tolerate possible ventilator.  Assessment and Plan:  Severe sepsis due to bilateral lower lobe pneumonia, POA: -Presenting with leukocytosis, tachycardia, tachypnea, hypoxia and AKI in setting of bilateral lower lobe pneumonia as seen on CXR.   -RSV is positive and patient is being treated for potential secondary bacterial pneumonia.  -PCT 3.84 -> 2.65 -> 0.13 -WBC Trend: Recent Labs  Lab 04/23/23 1947 04/24/23 0614 04/27/23 0248 04/28/23 0304  WBC 22.7* 19.3* 15.3* 15.5*  -Continue ceftriaxone, azithromycin. PCT declining but still elevated. WBC not likely to be reliable in setting of systemic steroids. -Lactic Acid Level Trend: Recent Labs  Lab 04/23/23 1958 04/23/23 2201  LATICACIDVEN 2.1* 1.4  -Send sputum culture (if able) -Continue IV Ceftriaxone, Azithromycin, -C/w Blood Culture monitoring (NGTD x 5 Days).  -Urine antigens still pending with urine Legionella Pneumophila Serogp 1 Ur Ag and Strep Pneumoniae Urine Antigen. Between his recent cruise, hyponatremia, GI symptoms, and slow to resolve, legionnaire's is not yet ruled  out. -No further IVF required. LA has cleared. May need to reinitiate if not taking po today. -See below -Repeat CXR in the AM   Acute Respiratory Failure with Hypoxia Due to AECOPD, RSV bronchiolitis/pneumonitis, and superimposed bacterial PNA.  -Continue supplemental oxygen to maintain SpO2 >89% and normal respiratory effort while treating conditions as outlined. Remains on 12 Liters SpO2: 91 % O2 Flow Rate (L/min): (S) 14 L/min FiO2 (%): 50 %; O2 Requirements are worsening  -Remains in SDU while still requiring high oxygen supplementation. -C/w  IS, FV and will Increase Guaifenesin 1200 mg po BID and continue Benzonatate 100 mg po PRN Cough -C/w arformoterol 15 mcg nebs twice daily, budesonide 0.25 mg nebs twice daily, and DuoNeb 3 mL every 6 as needed for wheezing or shortness of breath and will add Xopenex and Atrovent scheduled every 6h -Continue with IV steroids and increase the dose from 40 mg every 12 to 60 mg every 12 -Continue to monitor respiratory status carefully and continue supplemental oxygen via nasal cannula and wean O2 as tolerated-continuous pulse oximetry maintain O2 saturation greater 90% -Will need an ambulatory home O2 screen prior to discharge -Repeat chest x-ray in a.m. -Pulmonary consulted for further evaluation and recommendations and his markers of inflammation are getting better his procalcitonin is better and they are recommending continuing current antibiotic therapy and recommending inhalers at discharge as well.  Dr. Wynona Neat did discuss need for possible BiPAP if the patient is unable to maintain saturations in patient is claustrophobic but aware that if he is unable to tolerate BiPAP that they may end up having to place him on a ventilator   RSV infection and  Pneumonia -Supportive care, Droplet precautions   AECOPD -Continue scheduled and prn bronchodilators as above.  -Continue IV solumedrol. Wheezing improved, but not yet resolved.    Bibasilar  Pneumonia -See above -Repeat CXR done and showed " Expiratory exam with decreased lung excursion. Increased opacity in the hypoinflated lower lung fields which could be atelectasis, pneumonia or combination. Small area of new increased opacity laterally in the right upper lobe concerning for a new infiltrate." -Pulmonary Consulted for further evaluation and recommendations   AKI, improved -BUN/Cr Trend: Recent Labs  Lab 04/23/23 1947 04/24/23 0614 04/25/23 0258 04/27/23 0248 04/28/23 0304  BUN 41* 34* 29* 26* 23  CREATININE 1.73* 1.17 0.73 0.76 0.67  -Initially held losartan, but BP is quite elevated and Cr normal, restarted ARB with Losartan 100 mg po Daily -Avoid Nephrotoxic Medications, Contrast Dyes, Hypotension and Dehydration to Ensure Adequate Renal Perfusion and will need to Renally Adjust Meds -Continue to Monitor and Trend Renal Function carefully and repeat CMP in the AM   Essential Hypertension -Continue with Amlodipine 10 mg p.o. daily, Losartan 100 g p.o. daily -Continue to Monitor blood pressures per protocol -Last BP reading still elevated at 161/81  Hyponatremia -Na+ Trend: Recent Labs  Lab 04/23/23 1947 04/24/23 0614 04/25/23 0258 04/27/23 0248 04/28/23 0304  NA 131* 136 137 134* 135  -Continue to Monitor and Trend  -Repeat CMP in the AM   Abnormal LFTs, improved -AST and ALT Trend: Recent Labs  Lab 04/23/23 1947 04/24/23 0614 04/28/23 0304  AST 45* 24 21  ALT 54* 42 38  -Improved. Continue to Monitor and Trend and repeat CMP in the AM and if worsens again we will need a right upper ultrasound and acute hepatitis panel  Macrocytic Anemia -? Spurious Result -Hgb/Hct Trend: Recent Labs  Lab 04/23/23 1947 04/24/23 0614 04/27/23 0248 04/28/23 0304  HGB 15.0 12.0* 14.0 14.2  HCT 43.5 35.4* 42.9 42.5  MCV 98.0 102.0* 101.9* 102.2*  -Checked Anemia Panel and showed an iron level 47, UIBC 143, TIBC 190, saturation ratios of 25%, ferritin of 567,  folate of 6.1 and vitamin B12 1366 -Continue to Monitor for S/Sx of Bleeding; No overt bleeding noted -Repeat CBC in the AM   Hypoalbuminemia -Patient's Albumin Trend: Recent Labs  Lab 04/23/23 1947 04/24/23 0614 04/28/23 0304  ALBUMIN 2.9* 2.2* 2.4*  -Continue to Monitor and Trend and repeat CMP in the AM  Class I Obesity -Complicates overall prognosis and care -Estimated body mass index is 30.84 kg/m as calculated from the following:   Height as of this encounter: 5\' 10"  (1.778 m).   Weight as of this encounter: 97.5 kg.  -Weight Loss and Dietary Counseling given  DVT prophylaxis: enoxaparin (LOVENOX) injection 40 mg Start: 04/23/23 2200    Code Status: Full Code Family Communication: No family currently at bedside  Disposition Plan:  Level of care: Stepdown Status is: Inpatient Remains inpatient appropriate because: Needs further clinical improvement in his respiratory status and clearance from the pulm team   Consultants:  PCCM/Pulmonary  Procedures:  As delineated as above  Antimicrobials:  Anti-infectives (From admission, onward)    Start     Dose/Rate Route Frequency Ordered Stop   04/24/23 0800  cefTRIAXone (ROCEPHIN) 2 g in sodium chloride 0.9 % 100 mL IVPB        2 g 200 mL/hr over 30 Minutes Intravenous Every 24 hours 04/23/23 2136 04/28/23 0912   04/23/23 2015  azithromycin (ZITHROMAX) 500 mg in sodium chloride 0.9 % 250  mL IVPB        500 mg 250 mL/hr over 60 Minutes Intravenous Every 24 hours 04/23/23 2000 04/27/23 2222   04/23/23 2015  cefTRIAXone (ROCEPHIN) 1 g in sodium chloride 0.9 % 100 mL IVPB        1 g 200 mL/hr over 30 Minutes Intravenous  Once 04/23/23 2004 04/23/23 2031   04/23/23 2000  cefTRIAXone (ROCEPHIN) injection 1 g  Status:  Discontinued        1 g Intramuscular  Once 04/23/23 1957 04/23/23 2004       Subjective: Seen and examined at bedside and does not feel much better compared to yesterday.  Oxygen requirements are going up  and he feels about the same.  No lightheadedness or dizziness.  Continues to be dyspneic and short of breath.  Oxygen requirement is now up to 14 L today.  No other concerns or complaints at this time  Objective: Vitals:   04/28/23 0600 04/28/23 0743 04/28/23 0800 04/28/23 0900  BP: (!) 152/74  (!) 165/101 (!) 161/81  Pulse: 63  89 93  Resp: (!) 8  (!) 25 (!) 24  Temp:   97.8 F (36.6 C)   TempSrc:   Axillary   SpO2: 96% 91% 90% 91%  Weight:      Height:        Intake/Output Summary (Last 24 hours) at 04/28/2023 1251 Last data filed at 04/28/2023 1000 Gross per 24 hour  Intake 107.73 ml  Output 1100 ml  Net -992.27 ml   Filed Weights   04/24/23 0350 04/25/23 0500 04/28/23 0516  Weight: 98.1 kg 98.4 kg 97.5 kg   Examination: Physical Exam:  Constitutional: WN/WD obese elderly Caucasian male in no acute distress Respiratory: Diminished to auscultation bilaterally with some coarse breath sounds and has some rhonchi and wheezing but no appreciable crackles or rales. Normal respiratory effort and patient is not tachypenic. No accessory muscle use.  Wearing supplemental oxygen via nasal cannula at 14 L Cardiovascular: RRR, no murmurs / rubs / gallops. S1 and S2 auscultated.  Mild extremity edema.  Abdomen: Soft, non-tender, distended secondary to body habitus. Bowel sounds positive.  GU: Deferred. Musculoskeletal: No clubbing / cyanosis of digits/nails. No joint deformity upper and lower extremities.  Skin: No rashes, lesions, ulcers on limited skin evaluation. No induration; Warm and dry.  Neurologic: CN 2-12 grossly intact with no focal deficits. Sensation intact in all 4 Extremities, DTR normal. Strength 5/5 in all 4. Romberg sign and cerebellar reflexes not assessed.  Psychiatric: Normal judgment and insight. Alert and oriented x 3. Normal mood and appropriate affect.   Data Reviewed: I have personally reviewed following labs and imaging studies  CBC: Recent Labs  Lab  04/23/23 1947 04/24/23 0614 04/27/23 0248 04/28/23 0304  WBC 22.7* 19.3* 15.3* 15.5*  NEUTROABS  --   --   --  14.0*  HGB 15.0 12.0* 14.0 14.2  HCT 43.5 35.4* 42.9 42.5  MCV 98.0 102.0* 101.9* 102.2*  PLT 422* 295 486* 503*   Basic Metabolic Panel: Recent Labs  Lab 04/23/23 1947 04/24/23 0614 04/25/23 0258 04/27/23 0248 04/28/23 0304  NA 131* 136 137 134* 135  K 4.0 4.0 4.3 4.4 4.3  CL 95* 104 104 102 103  CO2 21* 21* 23 24 22   GLUCOSE 147* 194* 173* 133* 138*  BUN 41* 34* 29* 26* 23  CREATININE 1.73* 1.17 0.73 0.76 0.67  CALCIUM 8.2* 7.4* 8.5* 8.2* 8.3*  MG  --   --   --   --  2.1  PHOS  --   --   --   --  3.1   GFR: Estimated Creatinine Clearance: 100.6 mL/min (by C-G formula based on SCr of 0.67 mg/dL). Liver Function Tests: Recent Labs  Lab 04/23/23 1947 04/24/23 0614 04/28/23 0304  AST 45* 24 21  ALT 54* 42 38  ALKPHOS 71 56 48  BILITOT 1.5* 0.7 0.7  PROT 8.2* 6.6 6.5  ALBUMIN 2.9* 2.2* 2.4*   No results for input(s): "LIPASE", "AMYLASE" in the last 168 hours. No results for input(s): "AMMONIA" in the last 168 hours. Coagulation Profile: No results for input(s): "INR", "PROTIME" in the last 168 hours. Cardiac Enzymes: No results for input(s): "CKTOTAL", "CKMB", "CKMBINDEX", "TROPONINI" in the last 168 hours. BNP (last 3 results) No results for input(s): "PROBNP" in the last 8760 hours. HbA1C: No results for input(s): "HGBA1C" in the last 72 hours. CBG: No results for input(s): "GLUCAP" in the last 168 hours. Lipid Profile: No results for input(s): "CHOL", "HDL", "LDLCALC", "TRIG", "CHOLHDL", "LDLDIRECT" in the last 72 hours. Thyroid Function Tests: No results for input(s): "TSH", "T4TOTAL", "FREET4", "T3FREE", "THYROIDAB" in the last 72 hours. Anemia Panel: Recent Labs    04/28/23 0304  VITAMINB12 1,366*  FOLATE 6.1  FERRITIN 567*  TIBC 190*  IRON 47  RETICCTPCT 1.0   Sepsis Labs: Recent Labs  Lab 04/23/23 1958 04/23/23 2201  04/24/23 0614 04/25/23 0258 04/28/23 0304  PROCALCITON  --   --  3.84 2.65 0.13  LATICACIDVEN 2.1* 1.4  --   --   --     Recent Results (from the past 240 hours)  Resp panel by RT-PCR (RSV, Flu A&B, Covid) Anterior Nasal Swab     Status: Abnormal   Collection Time: 04/23/23  7:47 PM   Specimen: Anterior Nasal Swab  Result Value Ref Range Status   SARS Coronavirus 2 by RT PCR NEGATIVE NEGATIVE Final    Comment: (NOTE) SARS-CoV-2 target nucleic acids are NOT DETECTED.  The SARS-CoV-2 RNA is generally detectable in upper respiratory specimens during the acute phase of infection. The lowest concentration of SARS-CoV-2 viral copies this assay can detect is 138 copies/mL. A negative result does not preclude SARS-Cov-2 infection and should not be used as the sole basis for treatment or other patient management decisions. A negative result may occur with  improper specimen collection/handling, submission of specimen other than nasopharyngeal swab, presence of viral mutation(s) within the areas targeted by this assay, and inadequate number of viral copies(<138 copies/mL). A negative result must be combined with clinical observations, patient history, and epidemiological information. The expected result is Negative.  Fact Sheet for Patients:  BloggerCourse.com  Fact Sheet for Healthcare Providers:  SeriousBroker.it  This test is no t yet approved or cleared by the [place] FDA and  has been authorized for detection and/or diagnosis of SARS-CoV-2 by FDA under an Emergency Use Authorization (EUA). This EUA will remain  in effect (meaning this test can be used) for the duration of the COVID-19 declaration under Section 564(b)(1) of the Act, 21 U.S.C.section 360bbb-3(b)(1), unless the authorization is terminated  or revoked sooner.       Influenza A by PCR NEGATIVE NEGATIVE Final   Influenza B by PCR NEGATIVE NEGATIVE Final     Comment: (NOTE) The Xpert Xpress SARS-CoV-2/FLU/RSV plus assay is intended as an aid in the diagnosis of influenza from Nasopharyngeal swab specimens and should not be used as a sole basis for treatment. Nasal washings and aspirates are unacceptable for  Xpert Xpress SARS-CoV-2/FLU/RSV testing.  Fact Sheet for Patients: BloggerCourse.com  Fact Sheet for Healthcare Providers: SeriousBroker.it  This test is not yet approved or cleared by the [place] FDA and has been authorized for detection and/or diagnosis of SARS-CoV-2 by FDA under an Emergency Use Authorization (EUA). This EUA will remain in effect (meaning this test can be used) for the duration of the COVID-19 declaration under Section 564(b)(1) of the Act, 21 U.S.C. section 360bbb-3(b)(1), unless the authorization is terminated or revoked.     Resp Syncytial Virus by PCR POSITIVE (A) NEGATIVE Final    Comment: (NOTE) Fact Sheet for Patients: BloggerCourse.com  Fact Sheet for Healthcare Providers: SeriousBroker.it  This test is not yet approved or cleared by the [place] FDA and has been authorized for detection and/or diagnosis of SARS-CoV-2 by FDA under an Emergency Use Authorization (EUA). This EUA will remain in effect (meaning this test can be used) for the duration of the COVID-19 declaration under Section 564(b)(1) of the Act, 21 U.S.C. section 360bbb-3(b)(1), unless the authorization is terminated or revoked.  Performed at [facility], [address], [place], [place] [address]   Blood culture (routine x 2)     Status: None   Collection Time: 04/23/23  7:58 PM   Specimen: BLOOD  Result Value Ref Range Status   Specimen Description   Final    BLOOD LEFT ANTECUBITAL Performed at [facility], [address], [place], [place] [address]    Special Requests   Final     BOTTLES DRAWN AEROBIC AND ANAEROBIC Blood Culture adequate volume Performed at [facility], [address] [place], [place] [address]    Culture   Final    NO GROWTH 5 DAYS Performed at [facility], [address], [place], [place] [address]    Report Status 04/28/2023 FINAL  Final  Blood culture (routine x 2)     Status: None   Collection Time: 04/23/23  8:10 PM   Specimen: BLOOD LEFT FOREARM  Result Value Ref Range Status   Specimen Description   Final    BLOOD LEFT FOREARM Performed at [facility], [address] [place], [place] [address]    Special Requests   Final    BOTTLES DRAWN AEROBIC AND ANAEROBIC Blood Culture results may not be optimal due to an inadequate volume of blood received in culture bottles Performed at [facility], [address] [place], [place] [address]    Culture   Final    NO GROWTH 5 DAYS Performed at [facility], [address] [place], [place] [address]    Report Status 04/28/2023 FINAL  Final     Radiology Studies: DG CHEST PORT 1 VIEW Result Date: 04/28/2023 CLINICAL DATA:  [id number], shortness of breath. EXAM: PORTABLE CHEST 1 VIEW COMPARISON:  Portable chest 04/23/2023 FINDINGS: 4:27 a.m.  Expiratory exam with decreased lung excursion. There is increased opacity in the hypoinflated lower lung fields which could be atelectasis, pneumonia or combination. Small area of new increased opacity laterally in the right upper lobe concerning for a new infiltrate. The remaining upper lung fields are clear. The cardiomediastinal silhouette and vasculature are normal for expiration. IMPRESSION: 1. Expiratory exam with decreased lung excursion. 2. Increased opacity in the hypoinflated lower lung fields which could be atelectasis, pneumonia or combination. 3. Small area of new increased opacity laterally in the right upper lobe concerning for a new infiltrate. Electronically Signed  By: Almira Bar  M.D.   On: 04/28/2023 06:28   Scheduled Meds:  amLODipine  10 mg Oral QPM   arformoterol  15 mcg Nebulization BID   budesonide (PULMICORT) nebulizer solution  0.5 mg Nebulization BID   Chlorhexidine Gluconate Cloth  6 each Topical Daily   enoxaparin (LOVENOX) injection  40 mg Subcutaneous Q24H   guaiFENesin  1,200 mg Oral BID   ipratropium  0.5 mg Nebulization Q6H   levalbuterol  0.63 mg Nebulization Q6H   losartan  100 mg Oral Daily   melatonin  3 mg Oral QHS   methylPREDNISolone (SOLU-MEDROL) injection  60 mg Intravenous Q12H   mouth rinse  15 mL Mouth Rinse 4 times per day   sodium chloride flush  10 mL Intravenous Q12H   sodium chloride flush  3 mL Intravenous Q12H   Continuous Infusions:   LOS: 5 days   Marguerita Merles, DO [facility] Available via Epic secure chat 7am-7pm After these hours, please refer to coverage provider listed on [email or web address] 04/28/2023, 12:51 PM

## 2023-04-28 NOTE — Progress Notes (Signed)
Pt seen and given his scheduled nebulizer treatments which he tolerated well.  No increased wob / respiratory distress noted or voiced by patient at this time.  HR 79, rr22, spo2 93% on 12L hfnc.  Bipap not indicated at this time.

## 2023-04-28 NOTE — Consult Note (Addendum)
   NAME:  Devin Castro, MRN:  [id number], DOB:  [birth year], LOS: 5 ADMISSION DATE:  04/23/2023, CONSULTATION DATE: 04/28/2023 REFERRING MD: Dr. Marland Mcalpine, CHIEF COMPLAINT: Hypoxemic respiratory failure  History of Present Illness:  Asked to see patient for hypoxemic respiratory failure Being treated for RSV pneumonia Worsening oxygen requirement  He has an underlying diagnosis of COPD, was not on any inhalers chronically, was not following up with anyone  COVID in 2020 for which she was hospitalized and went home on oxygen  Admitted with worsening shortness of breath Underlying history of COPD, pulmonary embolism, hypertension Came in with 1 week of cough shortness of breath Hypoxemia when it was initially evaluated  Pertinent  Medical History   Past Medical History:  Diagnosis Date   Arthritis    COPD (chronic obstructive pulmonary disease) (HCC)    Hypertension    Kidney stone    PE (pulmonary embolism)    Shortness of breath dyspnea    Vertigo     Significant Hospital Events: Including procedures, antibiotic start and stop dates in addition to other pertinent events   Chest x-ray 04/28/2023-bibasal infiltrates, worse compared to 12 7  Interim History / Subjective:  Elderly gentleman, does not appear to be an extremis, does appear short of breath with activity  Objective   Blood pressure (!) 165/101, pulse 89, temperature 97.8 F (36.6 C), temperature source Axillary, resp. rate (!) 25, height 5\' 10"  (1.778 m), weight 97.5 kg, SpO2 90%.        Intake/Output Summary (Last 24 hours) at 04/28/2023 0945 Last data filed at 04/28/2023 4098 Gross per 24 hour  Intake 10 ml  Output 1100 ml  Net -1090 ml   Filed Weights   04/24/23 0350 04/25/23 0500 04/28/23 0516  Weight: 98.1 kg 98.4 kg 97.5 kg    Examination: General: Elderly, chronically ill-appearing HENT: Moist oral mucosa, nasal cannula in place Lungs: Decreased air movement bilaterally Cardiovascular: S1-S2  appreciated Abdomen: Soft, bowel sounds appreciated Extremities: No clubbing, no edema Neuro: Alert and oriented x 3 GU:   I reviewed nursing notes,  hospitalist notes, last 24 h vitals and pain scores, last 48 h intake and output, last 24 h labs and trends, and last 24 h imaging results.  Resolved Hospital Problem list     Assessment & Plan:  Acute hypoxemic respiratory failure RSV pneumonia -Increased oxygen requirement likely secondary to worsening infiltrate on chest x-ray -Markers of inflammation are getting better, procalcitonin is better -Leukocytosis improving -Continue current antibiotic therapy  Continue bronchodilators  Underlying chronic obstructive pulmonary disease, was not on any inhaler -Continue bronchodilator therapy -Will need inhalers when he is going home  RSV positive -Supportive  I did discuss possible need for BiPAP if unable to maintain his saturations, patient stated he is claustrophobic but also aware that if not able to tolerate BiPAP we may end up needing to have more in a ventilator  Encouraged ambulation around his bed at least -Encouraged to continue using incentive spirometer, flutter device   Virl Diamond, MD [facility] Pager: See Loretha Stapler

## 2023-04-28 NOTE — Plan of Care (Signed)
  Problem: Fluid Volume: Goal: Hemodynamic stability will improve Outcome: Progressing   Problem: Clinical Measurements: Goal: Diagnostic test results will improve Outcome: Progressing Goal: Signs and symptoms of infection will decrease Outcome: Progressing   Problem: Respiratory: Goal: Ability to maintain adequate ventilation will improve Outcome: Progressing   Problem: Education: Goal: Knowledge of General Education information will improve Description: Including pain rating scale, medication(s)/side effects and non-pharmacologic comfort measures Outcome: Progressing   Problem: Health Behavior/Discharge Planning: Goal: Ability to manage health-related needs will improve Outcome: Progressing   Problem: Clinical Measurements: Goal: Ability to maintain clinical measurements within normal limits will improve Outcome: Progressing Goal: Diagnostic test results will improve Outcome: Progressing Goal: Respiratory complications will improve Outcome: Progressing Goal: Cardiovascular complication will be avoided Outcome: Progressing   Problem: Activity: Goal: Risk for activity intolerance will decrease Outcome: Progressing   Problem: Nutrition: Goal: Adequate nutrition will be maintained Outcome: Progressing   Problem: Coping: Goal: Level of anxiety will decrease Outcome: Progressing   Problem: Elimination: Goal: Will not experience complications related to bowel motility Outcome: Progressing Goal: Will not experience complications related to urinary retention Outcome: Progressing   Problem: Pain Management: Goal: General experience of comfort will improve Outcome: Progressing   Problem: Safety: Goal: Ability to remain free from injury will improve Outcome: Progressing   Problem: Skin Integrity: Goal: Risk for impaired skin integrity will decrease Outcome: Progressing

## 2023-04-29 ENCOUNTER — Inpatient Hospital Stay (HOSPITAL_COMMUNITY): Payer: PPO

## 2023-04-29 DIAGNOSIS — J189 Pneumonia, unspecified organism: Secondary | ICD-10-CM | POA: Diagnosis not present

## 2023-04-29 DIAGNOSIS — J9601 Acute respiratory failure with hypoxia: Secondary | ICD-10-CM | POA: Diagnosis not present

## 2023-04-29 DIAGNOSIS — J441 Chronic obstructive pulmonary disease with (acute) exacerbation: Secondary | ICD-10-CM | POA: Diagnosis not present

## 2023-04-29 DIAGNOSIS — A419 Sepsis, unspecified organism: Secondary | ICD-10-CM | POA: Diagnosis not present

## 2023-04-29 LAB — CBC WITH DIFFERENTIAL/PLATELET
Abs Immature Granulocytes: 0.87 10*3/uL — ABNORMAL HIGH (ref 0.00–0.07)
Basophils Absolute: 0.2 10*3/uL — ABNORMAL HIGH (ref 0.0–0.1)
Basophils Relative: 1 %
Eosinophils Absolute: 0 10*3/uL (ref 0.0–0.5)
Eosinophils Relative: 0 %
HCT: 44.4 % (ref 39.0–52.0)
Hemoglobin: 14.3 g/dL (ref 13.0–17.0)
Immature Granulocytes: 5 %
Lymphocytes Relative: 2 %
Lymphs Abs: 0.4 10*3/uL — ABNORMAL LOW (ref 0.7–4.0)
MCH: 33.3 pg (ref 26.0–34.0)
MCHC: 32.2 g/dL (ref 30.0–36.0)
MCV: 103.3 fL — ABNORMAL HIGH (ref 80.0–100.0)
Monocytes Absolute: 0.4 10*3/uL (ref 0.1–1.0)
Monocytes Relative: 2 %
Neutro Abs: 14.8 10*3/uL — ABNORMAL HIGH (ref 1.7–7.7)
Neutrophils Relative %: 90 %
Platelets: 521 10*3/uL — ABNORMAL HIGH (ref 150–400)
RBC: 4.3 MIL/uL (ref 4.22–5.81)
RDW: 12.7 % (ref 11.5–15.5)
WBC: 16.6 10*3/uL — ABNORMAL HIGH (ref 4.0–10.5)
nRBC: 0 % (ref 0.0–0.2)

## 2023-04-29 LAB — COMPREHENSIVE METABOLIC PANEL
ALT: 36 U/L (ref 0–44)
AST: 20 U/L (ref 15–41)
Albumin: 2.4 g/dL — ABNORMAL LOW (ref 3.5–5.0)
Alkaline Phosphatase: 47 U/L (ref 38–126)
Anion gap: 8 (ref 5–15)
BUN: 22 mg/dL (ref 8–23)
CO2: 24 mmol/L (ref 22–32)
Calcium: 8.4 mg/dL — ABNORMAL LOW (ref 8.9–10.3)
Chloride: 99 mmol/L (ref 98–111)
Creatinine, Ser: 0.51 mg/dL — ABNORMAL LOW (ref 0.61–1.24)
GFR, Estimated: >60 mL/min (ref >60)
Glucose, Bld: 129 mg/dL — ABNORMAL HIGH (ref 70–99)
Potassium: 4.7 mmol/L (ref 3.5–5.1)
Sodium: 131 mmol/L — ABNORMAL LOW (ref 135–145)
Total Bilirubin: 0.6 mg/dL (ref <1.2)
Total Protein: 6.4 g/dL — ABNORMAL LOW (ref 6.5–8.1)

## 2023-04-29 LAB — MAGNESIUM: Magnesium: 2.2 mg/dL (ref 1.7–2.4)

## 2023-04-29 LAB — LEGIONELLA PNEUMOPHILA SEROGP 1 UR AG: L. pneumophila Serogp 1 Ur Ag: NEGATIVE

## 2023-04-29 LAB — PHOSPHORUS: Phosphorus: 3.9 mg/dL (ref 2.5–4.6)

## 2023-04-29 MED ORDER — SODIUM CHLORIDE 3 % IN NEBU
4.0000 mL | INHALATION_SOLUTION | Freq: Two times a day (BID) | RESPIRATORY_TRACT | Status: AC
  Administered 2023-04-29 – 2023-05-02 (×6): 4 mL via RESPIRATORY_TRACT
  Filled 2023-04-29 (×6): qty 4

## 2023-04-29 NOTE — Progress Notes (Signed)
Physical Therapy Treatment Patient Details Name: Devin Castro MRN: [id number] DOB: 09-Mar-1951 Today's Date: 04/29/2023   History of Present Illness 72 yo male admitted 04/23/23 with Severe sepsis due to bilateral lower lobe pneumonia, leukocytosis, tachycardia, tachypnea, hypoxia and AKI in setting of bilateral lower lobe pneumonia as seen on CXR.  RSV is positive and patient is being treated for potential secondary bacterial pneumonia. PMH: COPD,HTN,PE, Bil. TKA    PT Comments  Pt very cooperative and eager to mobilize.  Pt up to ambulate in halls x~200' with RW, O2@15L  and maintained SaO2 above 90% with max HR 117.  Pt pleased with progress.    If plan is discharge home, recommend the following: A little help with walking and/or transfers;Assistance with cooking/housework;Assist for transportation;Help with stairs or ramp for entrance   Can travel by private vehicle        Equipment Recommendations  None recommended by PT    Recommendations for Other Services       Precautions / Restrictions Precautions Precautions: Fall;Other (comment) Precaution Comments: monitor O2 Restrictions Weight Bearing Restrictions Per Provider Order: No     Mobility  Bed Mobility Overal bed mobility: Modified Independent             General bed mobility comments: Increased time but no physical assist    Transfers Overall transfer level: Needs assistance Equipment used: None Transfers: Sit to/from Stand Sit to Stand: Supervision           General transfer comment: cues for use of UEs to self assist    Ambulation/Gait Ambulation/Gait assistance: Min assist, Contact guard assist Gait Distance (Feet): 200 Feet Assistive device: Rolling walker (2 wheels) Gait Pattern/deviations: Step-to pattern, Step-through pattern, Decreased step length - right, Decreased step length - left, Shuffle, Trunk flexed Gait velocity: decr     General Gait Details: cues for posture and position from  [employer]             Wheelchair Mobility     Tilt Bed    Modified Rankin (Stroke Patients Only)       Balance Overall balance assessment: Mild deficits observed, not formally tested                                          Cognition Arousal: Alert Behavior During Therapy: WFL for tasks assessed/performed Overall Cognitive Status: Within Functional Limits for tasks assessed                                          Exercises      General Comments        Pertinent Vitals/Pain Pain Assessment Pain Assessment: No/denies pain    Home Living                          Prior Function            PT Goals (current goals can now be found in the care plan section) Acute Rehab PT Goals Patient Stated Goal: get back to work PT Goal Formulation: With patient Time For Goal Achievement: 05/11/23 Potential to Achieve Goals: Good Progress towards PT goals: Progressing toward goals    Frequency    Min 1X/week      PT  Plan      Co-evaluation              AM-PAC PT "6 Clicks" Mobility   Outcome Measure  Help needed turning from your back to your side while in a flat bed without using bedrails?: A Little Help needed moving from lying on your back to sitting on the side of a flat bed without using bedrails?: A Little Help needed moving to and from a bed to a chair (including a wheelchair)?: A Little Help needed standing up from a chair using your arms (e.g., wheelchair or bedside chair)?: A Little Help needed to walk in hospital room?: A Little Help needed climbing 3-5 steps with a railing? : Total 6 Click Score: 16    End of Session Equipment Utilized During Treatment: Gait belt;Oxygen Activity Tolerance: Patient tolerated treatment well Patient left: in chair;with call bell/phone within reach Nurse Communication: Mobility status PT Visit Diagnosis: Unsteadiness on feet (R26.81);Difficulty in walking,  not elsewhere classified (R26.2)     Time: 7253-6644 PT Time Calculation (min) (ACUTE ONLY): 32 min  Charges:    $Gait Training: 23-37 mins PT General Charges $$ ACUTE PT VISIT: 1 Visit                     Mauro Kaufmann PT Acute Rehabilitation Services Pager [phone] Office [phone]    Deontre Allsup 04/29/2023, 12:49 PM

## 2023-04-29 NOTE — Progress Notes (Signed)
   04/29/23 2000  BiPAP/CPAP/SIPAP  Reason BIPAP/CPAP not in use Other(comment) (not need of bipap at this time. pt has been refusing , no resp distress)  BiPAP/CPAP /SiPAP Vitals  Pulse Rate 82  Resp (!) 9  SpO2 96 %  MEWS Score/Color  MEWS Score 1  MEWS Score Color Green

## 2023-04-29 NOTE — Consult Note (Signed)
   NAME:  Devin Castro, MRN:  [id number], DOB:  Nov 18, 1950, LOS: 6 ADMISSION DATE:  04/23/2023, CONSULTATION DATE: 04/28/2023 REFERRING MD: Dr. Marland Mcalpine, CHIEF COMPLAINT: Hypoxemic respiratory failure  History of Present Illness:  Asked to see patient for hypoxemic respiratory failure Being treated for RSV pneumonia Worsening oxygen requirement  He has an underlying diagnosis of COPD, was not on any inhalers chronically, was not following up with anyone  COVID in 2020 for which she was hospitalized and went home on oxygen  Admitted with worsening shortness of breath Underlying history of COPD, pulmonary embolism, hypertension Came in with 1 week of cough shortness of breath Hypoxemia when it was initially evaluated  Pertinent  Medical History   Past Medical History:  Diagnosis Date   Arthritis    COPD (chronic obstructive pulmonary disease) (HCC)    Hypertension    Kidney stone    PE (pulmonary embolism)    Shortness of breath dyspnea    Vertigo     Significant Hospital Events: Including procedures, antibiotic start and stop dates in addition to other pertinent events   Chest x-ray 04/28/2023-bibasal infiltrates, worse compared to 12 7  Interim History / Subjective:  Unchanged O2 requirement between 12-14L with saturations in mid 90s.  Feels SOB but not in distress. Tolerated neb treatments  Declines BiPAP for support Objective   Blood pressure (!) 151/84, pulse 84, temperature 98.2 F (36.8 C), temperature source Oral, resp. rate (!) 24, height 5\' 10"  (1.778 m), weight 97.5 kg, SpO2 93%.        Intake/Output Summary (Last 24 hours) at 04/29/2023 0947 Last data filed at 04/29/2023 0600 Gross per 24 hour  Intake 215.73 ml  Output 1800 ml  Net -1584.27 ml   Filed Weights   04/24/23 0350 04/25/23 0500 04/28/23 0516  Weight: 98.1 kg 98.4 kg 97.5 kg    Physical Exam: General: Well-appearing, no acute distress HENT: [place], AT Eyes: EOMI, no scleral  icterus Respiratory: Diminished to auscultation bilaterally.  No crackles, wheezing or rales Cardiovascular: RRR, -M/R/G, no JVD Extremities:-Edema,-tenderness Neuro: AAO x4, CNII-XII grossly intact Psych: Normal mood, normal affect   Resolved Hospital Problem list     Assessment & Plan:  Acute hypoxemic respiratory failure RSV pneumonia -Increased oxygen requirement likely secondary to worsening infiltrate on chest x-ray -Markers of inflammation are getting better, procalcitonin is better -Overall leukocytosis improving, stable between 15-16 -Continue current antibiotic therapy for total 7 days -Continue scheduled brovana, pulmicort and duonebs -Encourage BiPAP as he likely has underlying OSA but patient declines -Pulmonary hygiene including flutter valve and IS, OOB as tolerated  Underlying chronic obstructive pulmonary disease, was not on any inhaler -Continue bronchodilator therapy -Will need inhalers when he is going home  RSV positive -Supportive  Care Time: 35 min  Mechele Collin, M.D. [facility] Pulmonary/Critical Care Medicine 04/29/2023 9:47 AM   See Amion for personal pager For hours between 7 PM to 7 AM, please call Elink for urgent questions

## 2023-04-29 NOTE — Progress Notes (Signed)
PROGRESS NOTE    Devin Castro  WUJ:[id number] DOB: 09/11/1950 DOA: 04/23/2023 PCP: Eartha Inch, MD   Brief Narrative:  Devin Castro is a 72 y.o. male with medical history significant for COPD, Hx of provoked PE in 2016 completed 6 months Xarelto, HTN, HLD who is admitted with severe sepsis and acute hypoxic respiratory failure due to bilateral lower lobe pneumonia and COPD exacerbation and was also found to have RSV infection.  Currently he is slowly improving but continues to still require quite a bit of oxygen.  Will continue oxygen weaning and have made further medication adjustments but his oxygen is worsening.    Chest x-ray also worsened so pulmonary has been consulted for further evaluation and recommending continuing supportive care and continue current antibiotics.  PCCM did discuss with him about BiPAP support if needed and then if unable to tolerate possible ventilator.  Assessment and Plan:  Severe sepsis due to bilateral lower lobe pneumonia, POA: -Presenting with leukocytosis, tachycardia, tachypnea, hypoxia and AKI in setting of bilateral lower lobe pneumonia as seen on CXR.   -RSV is positive and patient is being treated for potential secondary bacterial pneumonia.  -PCT 3.84 -> 2.65 -> 0.13 -WBC Trend: Recent Labs  Lab 04/23/23 1947 04/24/23 0614 04/27/23 0248 04/28/23 0304 04/29/23 0252  WBC 22.7* 19.3* 15.3* 15.5* 16.6*  -Continue Abx with IV Ceftriaxone and Azithromycin and per PCCM will need to continue for 7 days. PCT improved . WBC not likely to be reliable in setting of systemic steroids. -Lactic Acid Level Trend: Recent Labs  Lab 04/23/23 1958 04/23/23 2201  LATICACIDVEN 2.1* 1.4  -Send sputum culture (if able) -C/w Blood Culture monitoring (NGTD x 5 Days).  -Urine antigens still pending with urine Legionella Pneumophila Serogp 1 Ur Ag and Strep Pneumoniae Urine Antigen. Between his recent cruise, hyponatremia, GI symptoms, and slow to resolve,  legionnaire's is not yet ruled out. -No further IVF required. LA has cleared. May need to reinitiate if not taking po today. -See below -Repeat CXR in the AM -O2 requirements still quite high and his oxygen ranges from anywhere from 4 to 14 mm.  Will place him back on a diet given that he is doing better. -Pulmonary recommending to encourage BiPAP as a likely has underlying OSA but patient continues to decline.  They are recommending aggressive pulmonary hygiene currently.   Acute Respiratory Failure with Hypoxia Due to AECOPD, RSV bronchiolitis/pneumonitis, and superimposed bacterial PNA.  -Continue supplemental oxygen to maintain SpO2 >89% and normal respiratory effort while treating conditions as outlined. Remains on 12 Liters SpO2: 94 % O2 Flow Rate (L/min): (S) 13 L/min (Decreased to 11) FiO2 (%): 50 %; O2 Requirements are worsening  -Remains in SDU while still requiring high oxygen supplementation. -C/w  IS, FV and will Increase Guaifenesin 1200 mg po BID and continue Benzonatate 100 mg po PRN Cough -C/w arformoterol 15 mcg nebs twice daily, budesonide 0.25 mg nebs twice daily, and DuoNeb 3 mL every 6 as needed for wheezing or shortness of breath and will add Xopenex and Atrovent scheduled every 6h -Continue with IV steroids and increase the dose from 40 mg every 12 to 60 mg every 12 -Continue to monitor respiratory status carefully and continue supplemental oxygen via nasal cannula and wean O2 as tolerated-continuous pulse oximetry maintain O2 saturation greater 90% -Will need an ambulatory home O2 screen prior to discharge -Repeat chest x-ray in a.m. -Pulmonary consulted for further evaluation and recommendations and his markers of inflammation  are getting better his procalcitonin is better and they are recommending continuing current antibiotic therapy and recommending inhalers at discharge as well.  Dr. Wynona Neat did discuss need for possible BiPAP if the patient is unable to maintain  saturations in patient is claustrophobic but aware that if he is unable to tolerate BiPAP that they may end up having to place him on a ventilator   RSV infection and Pneumonia -Supportive care, Droplet precautions   AECOPD -Continue scheduled and prn bronchodilators as above.  -Continue IV solumedrol. Wheezing improved, but not yet resolved.    Bibasilar Pneumonia -See above -Repeat CXR done and showed " Expiratory exam with decreased lung excursion. Increased opacity in the hypoinflated lower lung fields which could be atelectasis, pneumonia or combination. Small area of new increased opacity laterally in the right upper lobe concerning for a new infiltrate." -Pulmonary Consulted for further evaluation and recommendations   AKI, improved -BUN/Cr Trend: Recent Labs  Lab 04/23/23 1947 04/24/23 0614 04/25/23 0258 04/27/23 0248 04/28/23 0304 04/29/23 0252  BUN 41* 34* 29* 26* 23 22  CREATININE 1.73* 1.17 0.73 0.76 0.67 0.51*  -Initially held losartan, but BP is quite elevated and Cr normal, restarted ARB with Losartan 100 mg po Daily -Avoid Nephrotoxic Medications, Contrast Dyes, Hypotension and Dehydration to Ensure Adequate Renal Perfusion and will need to Renally Adjust Meds -Continue to Monitor and Trend Renal Function carefully and repeat CMP in the AM   Essential Hypertension -Continue with Amlodipine 10 mg p.o. daily, Losartan 100 g p.o. daily -Continue to Monitor blood pressures per protocol -Last BP reading still elevated at 151/84  Hyponatremia, mild -Na+ Trend: Recent Labs  Lab 04/23/23 1947 04/24/23 0614 04/25/23 0258 04/27/23 0248 04/28/23 0304 04/29/23 0252  NA 131* 136 137 134* 135 131*  -Continue to Monitor and Trend now that it dropped to 131 -Repeat CMP in the AM   Abnormal LFTs, improved -AST and ALT Trend: Recent Labs  Lab 04/23/23 1947 04/24/23 0614 04/28/23 0304 04/29/23 0252  AST 45* 24 21 20   ALT 54* 42 38 36  -Improved. Continue to  Monitor and Trend and repeat CMP in the AM and if worsens again we will need a right upper ultrasound and acute hepatitis panel  Macrocytic Anemia -? Spurious Result -Hgb/Hct Trend: Recent Labs  Lab 04/23/23 1947 04/24/23 0614 04/27/23 0248 04/28/23 0304 04/29/23 0252  HGB 15.0 12.0* 14.0 14.2 14.3  HCT 43.5 35.4* 42.9 42.5 44.4  MCV 98.0 102.0* 101.9* 102.2* 103.3*  -Checked Anemia Panel and showed an iron level 47, UIBC 143, TIBC 190, saturation ratios of 25%, ferritin of 567, folate of 6.1 and vitamin B12 1366 -Continue to Monitor for S/Sx of Bleeding; No overt bleeding noted -Repeat CBC in the AM   Hypoalbuminemia -Patient's Albumin Trend: Recent Labs  Lab 04/23/23 1947 04/24/23 0614 04/28/23 0304 04/29/23 0252  ALBUMIN 2.9* 2.2* 2.4* 2.4*  -Continue to Monitor and Trend and repeat CMP in the AM  Class I Obesity -Complicates overall prognosis and care -Estimated body mass index is 30.84 kg/m as calculated from the following:   Height as of this encounter: 5\' 10"  (1.778 m).   Weight as of this encounter: 97.5 kg.  -Weight Loss and Dietary Counseling given   DVT prophylaxis: enoxaparin (LOVENOX) injection 40 mg Start: 04/23/23 2200    Code Status: Full Code Family Communication: No family currently at bedside  Disposition Plan:  Level of care: Stepdown Status is: Inpatient Remains inpatient appropriate because: Please further clinical improvement  and clearance by the specialists   Consultants:  Pulmonary  Procedures:  As delineated as above  Antimicrobials:  Anti-infectives (From admission, onward)    Start     Dose/Rate Route Frequency Ordered Stop   04/24/23 0800  cefTRIAXone (ROCEPHIN) 2 g in sodium chloride 0.9 % 100 mL IVPB        2 g 200 mL/hr over 30 Minutes Intravenous Every 24 hours 04/23/23 2136 04/28/23 0912   04/23/23 2015  azithromycin (ZITHROMAX) 500 mg in sodium chloride 0.9 % 250 mL IVPB        500 mg 250 mL/hr over 60 Minutes  Intravenous Every 24 hours 04/23/23 2000 04/27/23 2222   04/23/23 2015  cefTRIAXone (ROCEPHIN) 1 g in sodium chloride 0.9 % 100 mL IVPB        1 g 200 mL/hr over 30 Minutes Intravenous  Once 04/23/23 2004 04/23/23 2031   04/23/23 2000  cefTRIAXone (ROCEPHIN) injection 1 g  Status:  Discontinued        1 g Intramuscular  Once 04/23/23 1957 04/23/23 2004       Subjective: Seen and examined at bedside and feels okay.  Asking about taking Prevagen.  No nausea or vomiting.  Denies any chest pain but continues to be intermittently dyspneic and remains on 12 L.  No other concerns or complaints at this time.  Objective: Vitals:   04/29/23 0750 04/29/23 1200 04/29/23 1431 04/29/23 1432  BP:      Pulse:      Resp:      Temp: 98.2 F (36.8 C) 98 F (36.7 C)    TempSrc: Oral Axillary    SpO2: 93%  97% 94%  Weight:      Height:        Intake/Output Summary (Last 24 hours) at 04/29/2023 1515 Last data filed at 04/29/2023 0600 Gross per 24 hour  Intake --  Output 1800 ml  Net -1800 ml   Filed Weights   04/24/23 0350 04/25/23 0500 04/28/23 0516  Weight: 98.1 kg 98.4 kg 97.5 kg   Examination: Physical Exam:  Constitutional: WN/WD obese elderly Caucasian male in no acute distress Respiratory: Diminished to auscultation bilaterally with coarse breath sounds and has some rhonchi and slight wheezing but no appreciable crackles or rales. Normal respiratory effort and patient is not tachypenic. No accessory muscle use.  Wearing supplemental oxygen via nasal cannula at 12 L Cardiovascular: RRR, no murmurs / rubs / gallops. S1 and S2 auscultated.  Very mild extremity edema Abdomen: Soft, non-tender, non-distended.  Bowel sounds positive.  GU: Deferred. Musculoskeletal: No clubbing / cyanosis of digits/nails. No joint deformity upper and lower extremities. Skin: No rashes, lesions, ulcers on a limited skin evaluation. No induration; Warm and dry.  Neurologic: CN 2-12 grossly intact with no  focal deficits. Romberg sign and cerebellar reflexes not assessed.  Psychiatric: Normal judgment and insight. Alert and oriented x 3. Normal mood and appropriate affect.   Data Reviewed: I have personally reviewed following labs and imaging studies  CBC: Recent Labs  Lab 04/23/23 1947 04/24/23 0614 04/27/23 0248 04/28/23 0304 04/29/23 0252  WBC 22.7* 19.3* 15.3* 15.5* 16.6*  NEUTROABS  --   --   --  14.0* 14.8*  HGB 15.0 12.0* 14.0 14.2 14.3  HCT 43.5 35.4* 42.9 42.5 44.4  MCV 98.0 102.0* 101.9* 102.2* 103.3*  PLT 422* 295 486* 503* 521*   Basic Metabolic Panel: Recent Labs  Lab 04/24/23 1610 04/25/23 0258 04/27/23 0248 04/28/23 0304 04/29/23 0252  NA 136 137 134* 135 131*  K 4.0 4.3 4.4 4.3 4.7  CL 104 104 102 103 99  CO2 21* 23 24 22 24   GLUCOSE 194* 173* 133* 138* 129*  BUN 34* 29* 26* 23 22  CREATININE 1.17 0.73 0.76 0.67 0.51*  CALCIUM 7.4* 8.5* 8.2* 8.3* 8.4*  MG  --   --   --  2.1 2.2  PHOS  --   --   --  3.1 3.9   GFR: Estimated Creatinine Clearance: 100.6 mL/min (A) (by C-G formula based on SCr of 0.51 mg/dL (L)). Liver Function Tests: Recent Labs  Lab 04/23/23 1947 04/24/23 0614 04/28/23 0304 04/29/23 0252  AST 45* 24 21 20   ALT 54* 42 38 36  ALKPHOS 71 56 48 47  BILITOT 1.5* 0.7 0.7 0.6  PROT 8.2* 6.6 6.5 6.4*  ALBUMIN 2.9* 2.2* 2.4* 2.4*   No results for input(s): "LIPASE", "AMYLASE" in the last 168 hours. No results for input(s): "AMMONIA" in the last 168 hours. Coagulation Profile: No results for input(s): "INR", "PROTIME" in the last 168 hours. Cardiac Enzymes: No results for input(s): "CKTOTAL", "CKMB", "CKMBINDEX", "TROPONINI" in the last 168 hours. BNP (last 3 results) No results for input(s): "PROBNP" in the last 8760 hours. HbA1C: No results for input(s): "HGBA1C" in the last 72 hours. CBG: No results for input(s): "GLUCAP" in the last 168 hours. Lipid Profile: No results for input(s): "CHOL", "HDL", "LDLCALC", "TRIG", "CHOLHDL",  "LDLDIRECT" in the last 72 hours. Thyroid Function Tests: No results for input(s): "TSH", "T4TOTAL", "FREET4", "T3FREE", "THYROIDAB" in the last 72 hours. Anemia Panel: Recent Labs    04/28/23 0304  VITAMINB12 1,366*  FOLATE 6.1  FERRITIN 567*  TIBC 190*  IRON 47  RETICCTPCT 1.0   Sepsis Labs: Recent Labs  Lab 04/23/23 1958 04/23/23 2201 04/24/23 0614 04/25/23 0258 04/28/23 0304  PROCALCITON  --   --  3.84 2.65 0.13  LATICACIDVEN 2.1* 1.4  --   --   --    Recent Results (from the past 240 hours)  Resp panel by RT-PCR (RSV, Flu A&B, Covid) Anterior Nasal Swab     Status: Abnormal   Collection Time: 04/23/23  7:47 PM   Specimen: Anterior Nasal Swab  Result Value Ref Range Status   SARS Coronavirus 2 by RT PCR NEGATIVE NEGATIVE Final    Comment: (NOTE) SARS-CoV-2 target nucleic acids are NOT DETECTED.  The SARS-CoV-2 RNA is generally detectable in upper respiratory specimens during the acute phase of infection. The lowest concentration of SARS-CoV-2 viral copies this assay can detect is 138 copies/mL. A negative result does not preclude SARS-Cov-2 infection and should not be used as the sole basis for treatment or other patient management decisions. A negative result may occur with  improper specimen collection/handling, submission of specimen other than nasopharyngeal swab, presence of viral mutation(s) within the areas targeted by this assay, and inadequate number of viral copies(<138 copies/mL). A negative result must be combined with clinical observations, patient history, and epidemiological information. The expected result is Negative.  Fact Sheet for Patients:  BloggerCourse.com  Fact Sheet for Healthcare Providers:  SeriousBroker.it  This test is no t yet approved or cleared by the [place] FDA and  has been authorized for detection and/or diagnosis of SARS-CoV-2 by FDA under an Emergency Use  Authorization (EUA). This EUA will remain  in effect (meaning this test can be used) for the duration of the COVID-19 declaration under Section 564(b)(1) of the Act, 21 U.S.C.section 360bbb-3(b)(1),  unless the authorization is terminated  or revoked sooner.       Influenza A by PCR NEGATIVE NEGATIVE Final   Influenza B by PCR NEGATIVE NEGATIVE Final    Comment: (NOTE) The Xpert Xpress SARS-CoV-2/FLU/RSV plus assay is intended as an aid in the diagnosis of influenza from Nasopharyngeal swab specimens and should not be used as a sole basis for treatment. Nasal washings and aspirates are unacceptable for Xpert Xpress SARS-CoV-2/FLU/RSV testing.  Fact Sheet for Patients: BloggerCourse.com  Fact Sheet for Healthcare Providers: SeriousBroker.it  This test is not yet approved or cleared by the [place] FDA and has been authorized for detection and/or diagnosis of SARS-CoV-2 by FDA under an Emergency Use Authorization (EUA). This EUA will remain in effect (meaning this test can be used) for the duration of the COVID-19 declaration under Section 564(b)(1) of the Act, 21 U.S.C. section 360bbb-3(b)(1), unless the authorization is terminated or revoked.     Resp Syncytial Virus by PCR POSITIVE (A) NEGATIVE Final    Comment: (NOTE) Fact Sheet for Patients: BloggerCourse.com  Fact Sheet for Healthcare Providers: SeriousBroker.it  This test is not yet approved or cleared by the [place] FDA and has been authorized for detection and/or diagnosis of SARS-CoV-2 by FDA under an Emergency Use Authorization (EUA). This EUA will remain in effect (meaning this test can be used) for the duration of the COVID-19 declaration under Section 564(b)(1) of the Act, 21 U.S.C. section 360bbb-3(b)(1), unless the authorization is terminated or revoked.  Performed at [facility],  [address] [place], [place] [address]   Blood culture (routine x 2)     Status: None   Collection Time: 04/23/23  7:58 PM   Specimen: BLOOD  Result Value Ref Range Status   Specimen Description   Final    BLOOD LEFT ANTECUBITAL Performed at [facility], [address], [place], [place] [address]    Special Requests   Final    BOTTLES DRAWN AEROBIC AND ANAEROBIC Blood Culture adequate volume Performed at [facility], [address] [place], [place] [address]    Culture   Final    NO GROWTH 5 DAYS Performed at [facility], [address], [place], [place] [address]    Report Status 04/28/2023 FINAL  Final  Blood culture (routine x 2)     Status: None   Collection Time: 04/23/23  8:10 PM   Specimen: BLOOD LEFT FOREARM  Result Value Ref Range Status   Specimen Description   Final    BLOOD LEFT FOREARM Performed at [facility], [address], [place], [place] [address]    Special Requests   Final    BOTTLES DRAWN AEROBIC AND ANAEROBIC Blood Culture results may not be optimal due to an inadequate volume of blood received in culture bottles Performed at [facility], [address], [place], [place] [address]    Culture   Final    NO GROWTH 5 DAYS Performed at [facility], [address], [place], [place] [address]    Report Status 04/28/2023 FINAL  Final    Radiology Studies: DG CHEST PORT 1 VIEW Result Date: 04/29/2023 CLINICAL DATA:  Shortness of breath. EXAM: PORTABLE CHEST 1 VIEW COMPARISON:  April 28, 2023. FINDINGS: Stable cardiomediastinal silhouette. Stable bibasilar opacities are noted concerning for atelectasis, infiltrates or edema. Bony thorax is unremarkable. IMPRESSION: Stable bibasilar opacities as noted above. Electronically Signed   By: Fayrene Fearing  Christen Butter M.D.   On: 04/29/2023 08:21   DG CHEST PORT 1 VIEW Result Date: 04/28/2023 CLINICAL DATA:  [id number], shortness of breath.  EXAM: PORTABLE CHEST 1 VIEW COMPARISON:  Portable chest 04/23/2023 FINDINGS: 4:27 a.m.  Expiratory exam with decreased lung excursion. There is increased opacity in the hypoinflated lower lung fields which could be atelectasis, pneumonia or combination. Small area of new increased opacity laterally in the right upper lobe concerning for a new infiltrate. The remaining upper lung fields are clear. The cardiomediastinal silhouette and vasculature are normal for expiration. IMPRESSION: 1. Expiratory exam with decreased lung excursion. 2. Increased opacity in the hypoinflated lower lung fields which could be atelectasis, pneumonia or combination. 3. Small area of new increased opacity laterally in the right upper lobe concerning for a new infiltrate. Electronically Signed   By: Almira Bar M.D.   On: 04/28/2023 06:28   Scheduled Meds:  amLODipine  10 mg Oral QPM   arformoterol  15 mcg Nebulization BID   budesonide (PULMICORT) nebulizer solution  0.5 mg Nebulization BID   Chlorhexidine Gluconate Cloth  6 each Topical Daily   enoxaparin (LOVENOX) injection  40 mg Subcutaneous Q24H   guaiFENesin  1,200 mg Oral BID   ipratropium  0.5 mg Nebulization TID   levalbuterol  0.63 mg Nebulization TID   losartan  100 mg Oral Daily   melatonin  3 mg Oral QHS   methylPREDNISolone (SOLU-MEDROL) injection  60 mg Intravenous Q12H   mouth rinse  15 mL Mouth Rinse 4 times per day   sodium chloride flush  3 mL Intravenous Q12H   sodium chloride HYPERTONIC  4 mL Nebulization BID   Continuous Infusions:   LOS: 6 days   Marguerita Merles, DO [facility] Available via Epic secure chat 7am-7pm After these hours, please refer to coverage provider listed on [email or web address] 04/29/2023, 3:15 PM

## 2023-04-29 NOTE — Progress Notes (Signed)
   NAME:  Devin Castro, MRN:  [id number], DOB:  1950/10/12, LOS: 6 ADMISSION DATE:  04/23/2023, CONSULTATION DATE: 04/28/2023 REFERRING MD: Dr. Marland Mcalpine, CHIEF COMPLAINT: Hypoxemic respiratory failure  History of Present Illness:  Asked to see patient for hypoxemic respiratory failure Being treated for RSV pneumonia Worsening oxygen requirement  He has an underlying diagnosis of COPD, was not on any inhalers chronically, was not following up with anyone  COVID in 2020 for which she was hospitalized and went home on oxygen  Admitted with worsening shortness of breath Underlying history of COPD, pulmonary embolism, hypertension Came in with 1 week of cough shortness of breath Hypoxemia when it was initially evaluated  Pertinent  Medical History   Past Medical History:  Diagnosis Date   Arthritis    COPD (chronic obstructive pulmonary disease) (HCC)    Hypertension    Kidney stone    PE (pulmonary embolism)    Shortness of breath dyspnea    Vertigo     Significant Hospital Events: Including procedures, antibiotic start and stop dates in addition to other pertinent events   Chest x-ray 04/28/2023-bibasal infiltrates, worse compared to 12 7 12/16 improvement in resp WOB. Off BIPAP  12/17 on 4 lpm tol activity well  Interim History / Subjective:  No distress. Walked entire unit on 4 lpm Objective   Blood pressure (!) 151/84, pulse 84, temperature (!) 97.4 F (36.3 C), temperature source Axillary, resp. rate (!) 24, height 5\' 10"  (1.778 m), weight 97.5 kg, SpO2 93%.        Intake/Output Summary (Last 24 hours) at 04/29/2023 0749 Last data filed at 04/29/2023 0600 Gross per 24 hour  Intake 215.73 ml  Output 1800 ml  Net -1584.27 ml   Filed Weights   04/24/23 0350 04/25/23 0500 04/28/23 0516  Weight: 98.1 kg 98.4 kg 97.5 kg    Examination: General 72 year old wm sitting up in chair. He is in no distress today HENT NCAT no JVD  Pulm dec bases now on 4 lpm tolerating  activity well w/ this  Pcxr continues to demonstrate bilateral basilar volume loss. POCUS: negative for pleural effusions Card rrr Abd soft Ext warm and dry  Neuro intact  Resolved Hospital Problem list     Assessment & Plan:  Acute hypoxemic respiratory failure due to  RSV pneumonia superimposed on Underlying chronic obstructive pulmonary disease Cont to improve slowly Plan Will transition to Breo Ellipta today  Cont to wean Oxygen for sats > 88% w/ cont pulse ox monitoring  Change to oral steroids. Begin slow taper Cont flutter and IS Will set him up for follow up in our office for post-hospital f/u as well as eventual PFTs.

## 2023-04-30 ENCOUNTER — Inpatient Hospital Stay (HOSPITAL_COMMUNITY): Payer: PPO

## 2023-04-30 DIAGNOSIS — J189 Pneumonia, unspecified organism: Secondary | ICD-10-CM | POA: Diagnosis not present

## 2023-04-30 DIAGNOSIS — J9601 Acute respiratory failure with hypoxia: Secondary | ICD-10-CM | POA: Diagnosis not present

## 2023-04-30 DIAGNOSIS — J441 Chronic obstructive pulmonary disease with (acute) exacerbation: Secondary | ICD-10-CM | POA: Diagnosis not present

## 2023-04-30 DIAGNOSIS — A419 Sepsis, unspecified organism: Secondary | ICD-10-CM | POA: Diagnosis not present

## 2023-04-30 LAB — CBC WITH DIFFERENTIAL/PLATELET
Abs Immature Granulocytes: 0.89 10*3/uL — ABNORMAL HIGH (ref 0.00–0.07)
Basophils Absolute: 0.1 10*3/uL (ref 0.0–0.1)
Basophils Relative: 1 %
Eosinophils Absolute: 0 10*3/uL (ref 0.0–0.5)
Eosinophils Relative: 0 %
HCT: 41.7 % (ref 39.0–52.0)
Hemoglobin: 14 g/dL (ref 13.0–17.0)
Immature Granulocytes: 6 %
Lymphocytes Relative: 2 %
Lymphs Abs: 0.3 10*3/uL — ABNORMAL LOW (ref 0.7–4.0)
MCH: 34.3 pg — ABNORMAL HIGH (ref 26.0–34.0)
MCHC: 33.6 g/dL (ref 30.0–36.0)
MCV: 102.2 fL — ABNORMAL HIGH (ref 80.0–100.0)
Monocytes Absolute: 0.3 10*3/uL (ref 0.1–1.0)
Monocytes Relative: 2 %
Neutro Abs: 12.7 10*3/uL — ABNORMAL HIGH (ref 1.7–7.7)
Neutrophils Relative %: 89 %
Platelets: 522 10*3/uL — ABNORMAL HIGH (ref 150–400)
RBC: 4.08 MIL/uL — ABNORMAL LOW (ref 4.22–5.81)
RDW: 12.7 % (ref 11.5–15.5)
WBC: 14.3 10*3/uL — ABNORMAL HIGH (ref 4.0–10.5)
nRBC: 0 % (ref 0.0–0.2)

## 2023-04-30 LAB — COMPREHENSIVE METABOLIC PANEL
ALT: 38 U/L (ref 0–44)
AST: 21 U/L (ref 15–41)
Albumin: 2.5 g/dL — ABNORMAL LOW (ref 3.5–5.0)
Alkaline Phosphatase: 42 U/L (ref 38–126)
Anion gap: 7 (ref 5–15)
BUN: 26 mg/dL — ABNORMAL HIGH (ref 8–23)
CO2: 24 mmol/L (ref 22–32)
Calcium: 8.3 mg/dL — ABNORMAL LOW (ref 8.9–10.3)
Chloride: 100 mmol/L (ref 98–111)
Creatinine, Ser: 0.68 mg/dL (ref 0.61–1.24)
GFR, Estimated: >60 mL/min (ref >60)
Glucose, Bld: 133 mg/dL — ABNORMAL HIGH (ref 70–99)
Potassium: 4.8 mmol/L (ref 3.5–5.1)
Sodium: 131 mmol/L — ABNORMAL LOW (ref 135–145)
Total Bilirubin: 0.5 mg/dL (ref <1.2)
Total Protein: 6.1 g/dL — ABNORMAL LOW (ref 6.5–8.1)

## 2023-04-30 LAB — PHOSPHORUS: Phosphorus: 4.1 mg/dL (ref 2.5–4.6)

## 2023-04-30 LAB — MAGNESIUM: Magnesium: 2.2 mg/dL (ref 1.7–2.4)

## 2023-04-30 NOTE — Plan of Care (Signed)
  Problem: Education: Goal: Knowledge of General Education information will improve Description: Including pain rating scale, medication(s)/side effects and non-pharmacologic comfort measures Outcome: Progressing   Problem: Clinical Measurements: Goal: Respiratory complications will improve Outcome: Progressing Goal: Cardiovascular complication will be avoided Outcome: Progressing   Problem: Nutrition: Goal: Adequate nutrition will be maintained Outcome: Progressing   Problem: Coping: Goal: Level of anxiety will decrease Outcome: Progressing   Problem: Elimination: Goal: Will not experience complications related to bowel motility Outcome: Progressing Goal: Will not experience complications related to urinary retention Outcome: Progressing   Problem: Pain Management: Goal: General experience of comfort will improve Outcome: Progressing   Problem: Safety: Goal: Ability to remain free from injury will improve Outcome: Progressing   Problem: Skin Integrity: Goal: Risk for impaired skin integrity will decrease Outcome: Progressing

## 2023-04-30 NOTE — Progress Notes (Signed)
PROGRESS NOTE    Devin Castro  ZOX:[id number] DOB: Jun 20, 1950 DOA: 04/23/2023 PCP: Eartha Inch, MD   Brief Narrative:  Devin Castro is a 72 y.o. male with medical history significant for COPD, Hx of provoked PE in 2016 completed 6 months Xarelto, HTN, HLD who is admitted with severe sepsis and acute hypoxic respiratory failure due to bilateral lower lobe pneumonia and COPD exacerbation and was also found to have RSV infection.  Currently he is slowly improving but continues to still require quite a bit of oxygen.  Will continue oxygen weaning and have made further medication adjustments but his oxygen is worsening.    His worsening status pulmonary was consulted for further evaluation recommending continuing current antibiotics and supportive care.  Antibiotics have now finished.  PCCM did discuss with him about BiPAP support if needed and then if unable to tolerate possible ventilator.  He is slowly improving and now weaned down to 11 L  Assessment and Plan:  Severe Sepsis due to Bilateral Lower Lobe Pneumonia, POA: -Presenting with leukocytosis, tachycardia, tachypnea, hypoxia and AKI in setting of bilateral lower lobe pneumonia as seen on CXR.   -RSV is positive and patient is being treated for potential secondary bacterial pneumonia.  -PCT 3.84 -> 2.65 -> 0.13 -WBC Trend: Recent Labs  Lab 04/23/23 1947 04/24/23 0614 04/27/23 0248 04/28/23 0304 04/29/23 0252 04/30/23 0311  WBC 22.7* 19.3* 15.3* 15.5* 16.6* 14.3*  -Continue Abx with IV Ceftriaxone and Azithromycin and per PCCM will need to continue for 7 days. PCT improved . WBC not likely to be reliable in setting of systemic steroids. -Lactic Acid Level Trend: Recent Labs  Lab 04/23/23 1958 04/23/23 2201  LATICACIDVEN 2.1* 1.4  -Send sputum culture (if able) -C/w Blood Culture monitoring (NGTD x 5 Days).  -Urine antigens still pending with urine Legionella Pneumophila Serogp 1 Ur Ag and Strep Pneumoniae Urine Antigen.  Between his recent cruise, hyponatremia, GI symptoms, and slow to resolve, legionnaire's is not yet ruled out. -No further IVF required. LA has cleared. May need to reinitiate if not taking po today. -See below -Repeat CXR this AM as below and will need to Repeat in the AM -O2 requirements still quite high and his oxygen ranges from anywhere from 4 to 14 mm.  Will place him back on a diet given that he is doing better. -Pulmonary recommending to encourage BiPAP as a likely has underlying OSA but patient continues to decline.  They are recommending aggressive pulmonary hygiene currently.   Acute Respiratory Failure with Hypoxia Due to AECOPD, RSV bronchiolitis/pneumonitis, and superimposed bacterial PNA.  -Continue supplemental oxygen to maintain SpO2 >89% and normal respiratory effort while treating conditions as outlined.  SpO2: 91 % O2 Flow Rate (L/min): 11 L/min FiO2 (%): 50 %; O2 Requirements are slightly improved and now down to 11 Liters  -Remains in SDU while still requiring high oxygen supplementation. -C/w  IS, FV and will Increase Guaifenesin 1200 mg po BID and continue Benzonatate 100 mg po PRN Cough -C/w arformoterol 15 mcg nebs twice daily, budesonide 0.25 mg nebs twice daily, and DuoNeb 3 mL every 6 as needed for wheezing or shortness of breath and will add Xopenex and Atrovent scheduled every 6h -Continue with IV steroids and increase the dose from 40 mg every 12 to 60 mg every 12 -Continue to monitor respiratory status carefully and continue supplemental oxygen via nasal cannula and wean O2 as tolerated-continuous pulse oximetry maintain O2 saturation greater 90% -Will need an  ambulatory home O2 screen prior to discharge -Repeat Chest Xray done and showed "Very low lung volumes. Hazy bibasilar lung opacities, slightly improved on the right and similar on the left, favor atelectasis." -Pulmonary consulted for further evaluation and recommendations and his markers of inflammation are  getting better his procalcitonin is better and they are recommending continuing current antibiotic therapy and recommending inhalers at discharge as well.  Dr. Wynona Neat did discuss need for possible BiPAP if the patient is unable to maintain saturations in patient is claustrophobic but aware that if he is unable to tolerate BiPAP that they may end up having to place him on a ventilator   RSV Infection and Pneumonia -Supportive care, Droplet precautions   AECOPD -Continue scheduled and prn bronchodilators as above.  -Continue IV solumedrol. Wheezing improved, but not yet resolved.    Bibasilar Pneumonia -See above -Repeat CXR done and showed " Expiratory exam with decreased lung excursion. Increased opacity in the hypoinflated lower lung fields which could be atelectasis, pneumonia or combination. Small area of new increased opacity laterally in the right upper lobe concerning for a new infiltrate." -Pulmonary Consulted for further evaluation and recommendations and is very slowly improving    AKI, improved -BUN/Cr Trend: Recent Labs  Lab 04/23/23 1947 04/24/23 0614 04/25/23 0258 04/27/23 0248 04/28/23 0304 04/29/23 0252 04/30/23 0311  BUN 41* 34* 29* 26* 23 22 26*  CREATININE 1.73* 1.17 0.73 0.76 0.67 0.51* 0.68  -Initially held Losartan, but BP is quite elevated and Cr normal, restarted ARB with Losartan 100 mg po Daily -Avoid Nephrotoxic Medications, Contrast Dyes, Hypotension and Dehydration to Ensure Adequate Renal Perfusion and will need to Renally Adjust Meds -Continue to Monitor and Trend Renal Function carefully and repeat CMP in the AM   Essential Hypertension -Continue with Amlodipine 10 mg p.o. daily, Losartan 100 g p.o. daily -Continue to Monitor blood pressures per protocol -Last BP reading still elevated at 127/71  Hyponatremia, mild -Na+ Trend: Recent Labs  Lab 04/23/23 1947 04/24/23 0614 04/25/23 0258 04/27/23 0248 04/28/23 0304 04/29/23 0252  04/30/23 0311  NA 131* 136 137 134* 135 131* 131*  -Continue to Monitor and Trend now that it dropped to 131 and is the same -Repeat CMP in the AM   Abnormal LFTs, improved -AST and ALT Trend: Recent Labs  Lab 04/23/23 1947 04/24/23 0614 04/28/23 0304 04/29/23 0252 04/30/23 0311  AST 45* 24 21 20 21   ALT 54* 42 38 36 38  -Improved. Continue to Monitor and Trend and repeat CMP in the AM and if worsens again we will need a right upper ultrasound and acute hepatitis panel  Macrocytic Anemia -? Spurious Result -Hgb/Hct Trend: Recent Labs  Lab 04/23/23 1947 04/24/23 1610 04/27/23 0248 04/28/23 0304 04/29/23 0252 04/30/23 0311  HGB 15.0 12.0* 14.0 14.2 14.3 14.0  HCT 43.5 35.4* 42.9 42.5 44.4 41.7  MCV 98.0 102.0* 101.9* 102.2* 103.3* 102.2*  -Checked Anemia Panel and showed an iron level 47, UIBC 143, TIBC 190, saturation ratios of 25%, ferritin of 567, folate of 6.1 and vitamin B12 1366 -Continue to Monitor for S/Sx of Bleeding; No overt bleeding noted -Repeat CBC in the AM   Thrombocytosis -Likely reactive -Platelet Count Trend: Recent Labs  Lab 04/23/23 1947 04/24/23 0614 04/27/23 0248 04/28/23 0304 04/29/23 0252 04/30/23 0311  PLT 422* 295 486* 503* 521* 522*  -Continue to Monitor and Trend and repeat CMP in the AM  Hypoalbuminemia -Patient's Albumin Trend: Recent Labs  Lab 04/23/23 1947 04/24/23 0614 04/28/23  0304 04/29/23 0252 04/30/23 0311  ALBUMIN 2.9* 2.2* 2.4* 2.4* 2.5*  -Continue to Monitor and Trend and repeat CMP in the AM  Class I Obesity -Complicates overall prognosis and care -Estimated body mass index is 30.84 kg/m as calculated from the following:   Height as of this encounter: 5\' 10"  (1.778 m).   Weight as of this encounter: 97.5 kg.  -Weight Loss and Dietary Counseling given   DVT prophylaxis: enoxaparin (LOVENOX) injection 40 mg Start: 04/23/23 2200    Code Status: Full Code Family Communication: No family present at bedside    Disposition Plan:  Level of care: Stepdown Status is: Inpatient Remains inpatient appropriate because: Needs further clinical improvement and clearance by the specialist   Consultants:  Pulmonary  Procedures:  As delineated as above  Antimicrobials:  Anti-infectives (From admission, onward)    Start     Dose/Rate Route Frequency Ordered Stop   04/24/23 0800  cefTRIAXone (ROCEPHIN) 2 g in sodium chloride 0.9 % 100 mL IVPB        2 g 200 mL/hr over 30 Minutes Intravenous Every 24 hours 04/23/23 2136 04/28/23 0912   04/23/23 2015  azithromycin (ZITHROMAX) 500 mg in sodium chloride 0.9 % 250 mL IVPB        500 mg 250 mL/hr over 60 Minutes Intravenous Every 24 hours 04/23/23 2000 04/27/23 2222   04/23/23 2015  cefTRIAXone (ROCEPHIN) 1 g in sodium chloride 0.9 % 100 mL IVPB        1 g 200 mL/hr over 30 Minutes Intravenous  Once 04/23/23 2004 04/23/23 2031   04/23/23 2000  cefTRIAXone (ROCEPHIN) injection 1 g  Status:  Discontinued        1 g Intramuscular  Once 04/23/23 1957 04/23/23 2004       Subjective: Seen and examined at bedside and thinks his respiratory status is doing little bit better.  No nausea or vomiting.  Denied any chest pain.  No lightheadedness or dizziness.  No other concerns or complaints at this time.  Objective: Vitals:   04/30/23 1200 04/30/23 1246 04/30/23 1300 04/30/23 1310  BP: (!) 156/77  127/71   Pulse: 95 88 81   Resp: 18 (!) 24 20   Temp: (!) 97.4 F (36.3 C)     TempSrc: Axillary     SpO2: 90% 91% 92% 91%  Weight:      Height:        Intake/Output Summary (Last 24 hours) at 04/30/2023 1400 Last data filed at 04/30/2023 1246 Gross per 24 hour  Intake 3 ml  Output 2150 ml  Net -2147 ml   Filed Weights   04/24/23 0350 04/25/23 0500 04/28/23 0516  Weight: 98.1 kg 98.4 kg 97.5 kg   Examination: Physical Exam:  Constitutional: WN/WD obese elderly Caucasian male in no acute distress Respiratory: Diminished to auscultation bilaterally  with coarse sounds and does have some rhonchi and mild wheezing but no appreciable crackles or rales. Normal respiratory effort and patient is not tachypenic. No accessory muscle use.  Wearing supplemental oxygen via nasal cannula and is now on 11 L Cardiovascular: RRR, no murmurs / rubs / gallops. S1 and S2 auscultated.  Mild extremity edema.  Abdomen: Soft, non-tender, distended secondary body habitus. Bowel sounds positive.  GU: Deferred. Musculoskeletal: No clubbing / cyanosis of digits/nails. No joint deformity upper and lower extremities.  Skin: No rashes, lesions, ulcers on limited skin evaluation. No induration; Warm and dry.  Neurologic: CN 2-12 grossly intact with no focal  deficits. Romberg sign and cerebellar reflexes not assessed.  Psychiatric: Normal judgment and insight. Alert and oriented x 3. Normal mood and appropriate affect.   Data Reviewed: I have personally reviewed following labs and imaging studies  CBC: Recent Labs  Lab 04/24/23 0614 04/27/23 0248 04/28/23 0304 04/29/23 0252 04/30/23 0311  WBC 19.3* 15.3* 15.5* 16.6* 14.3*  NEUTROABS  --   --  14.0* 14.8* 12.7*  HGB 12.0* 14.0 14.2 14.3 14.0  HCT 35.4* 42.9 42.5 44.4 41.7  MCV 102.0* 101.9* 102.2* 103.3* 102.2*  PLT 295 486* 503* 521* 522*   Basic Metabolic Panel: Recent Labs  Lab 04/25/23 0258 04/27/23 0248 04/28/23 0304 04/29/23 0252 04/30/23 0311  NA 137 134* 135 131* 131*  K 4.3 4.4 4.3 4.7 4.8  CL 104 102 103 99 100  CO2 23 24 22 24 24   GLUCOSE 173* 133* 138* 129* 133*  BUN 29* 26* 23 22 26*  CREATININE 0.73 0.76 0.67 0.51* 0.68  CALCIUM 8.5* 8.2* 8.3* 8.4* 8.3*  MG  --   --  2.1 2.2 2.2  PHOS  --   --  3.1 3.9 4.1   GFR: Estimated Creatinine Clearance: 100.6 mL/min (by C-G formula based on SCr of 0.68 mg/dL). Liver Function Tests: Recent Labs  Lab 04/23/23 1947 04/24/23 0614 04/28/23 0304 04/29/23 0252 04/30/23 0311  AST 45* 24 21 20 21   ALT 54* 42 38 36 38  ALKPHOS 71 56 48 47 42   BILITOT 1.5* 0.7 0.7 0.6 0.5  PROT 8.2* 6.6 6.5 6.4* 6.1*  ALBUMIN 2.9* 2.2* 2.4* 2.4* 2.5*   No results for input(s): "LIPASE", "AMYLASE" in the last 168 hours. No results for input(s): "AMMONIA" in the last 168 hours. Coagulation Profile: No results for input(s): "INR", "PROTIME" in the last 168 hours. Cardiac Enzymes: No results for input(s): "CKTOTAL", "CKMB", "CKMBINDEX", "TROPONINI" in the last 168 hours. BNP (last 3 results) No results for input(s): "PROBNP" in the last 8760 hours. HbA1C: No results for input(s): "HGBA1C" in the last 72 hours. CBG: No results for input(s): "GLUCAP" in the last 168 hours. Lipid Profile: No results for input(s): "CHOL", "HDL", "LDLCALC", "TRIG", "CHOLHDL", "LDLDIRECT" in the last 72 hours. Thyroid Function Tests: No results for input(s): "TSH", "T4TOTAL", "FREET4", "T3FREE", "THYROIDAB" in the last 72 hours. Anemia Panel: Recent Labs    04/28/23 0304  VITAMINB12 1,366*  FOLATE 6.1  FERRITIN 567*  TIBC 190*  IRON 47  RETICCTPCT 1.0   Sepsis Labs: Recent Labs  Lab 04/23/23 1958 04/23/23 2201 04/24/23 0614 04/25/23 0258 04/28/23 0304  PROCALCITON  --   --  3.84 2.65 0.13  LATICACIDVEN 2.1* 1.4  --   --   --    Recent Results (from the past 240 hours)  Resp panel by RT-PCR (RSV, Flu A&B, Covid) Anterior Nasal Swab     Status: Abnormal   Collection Time: 04/23/23  7:47 PM   Specimen: Anterior Nasal Swab  Result Value Ref Range Status   SARS Coronavirus 2 by RT PCR NEGATIVE NEGATIVE Final    Comment: (NOTE) SARS-CoV-2 target nucleic acids are NOT DETECTED.  The SARS-CoV-2 RNA is generally detectable in upper respiratory specimens during the acute phase of infection. The lowest concentration of SARS-CoV-2 viral copies this assay can detect is 138 copies/mL. A negative result does not preclude SARS-Cov-2 infection and should not be used as the sole basis for treatment or other patient management decisions. A negative result may  occur with  improper specimen collection/handling, submission  of specimen other than nasopharyngeal swab, presence of viral mutation(s) within the areas targeted by this assay, and inadequate number of viral copies(<138 copies/mL). A negative result must be combined with clinical observations, patient history, and epidemiological information. The expected result is Negative.  Fact Sheet for Patients:  BloggerCourse.com  Fact Sheet for Healthcare Providers:  SeriousBroker.it  This test is no t yet approved or cleared by the [place] FDA and  has been authorized for detection and/or diagnosis of SARS-CoV-2 by FDA under an Emergency Use Authorization (EUA). This EUA will remain  in effect (meaning this test can be used) for the duration of the COVID-19 declaration under Section 564(b)(1) of the Act, 21 U.S.C.section 360bbb-3(b)(1), unless the authorization is terminated  or revoked sooner.       Influenza A by PCR NEGATIVE NEGATIVE Final   Influenza B by PCR NEGATIVE NEGATIVE Final    Comment: (NOTE) The Xpert Xpress SARS-CoV-2/FLU/RSV plus assay is intended as an aid in the diagnosis of influenza from Nasopharyngeal swab specimens and should not be used as a sole basis for treatment. Nasal washings and aspirates are unacceptable for Xpert Xpress SARS-CoV-2/FLU/RSV testing.  Fact Sheet for Patients: BloggerCourse.com  Fact Sheet for Healthcare Providers: SeriousBroker.it  This test is not yet approved or cleared by the [place] FDA and has been authorized for detection and/or diagnosis of SARS-CoV-2 by FDA under an Emergency Use Authorization (EUA). This EUA will remain in effect (meaning this test can be used) for the duration of the COVID-19 declaration under Section 564(b)(1) of the Act, 21 U.S.C. section 360bbb-3(b)(1), unless the authorization is terminated  or revoked.     Resp Syncytial Virus by PCR POSITIVE (A) NEGATIVE Final    Comment: (NOTE) Fact Sheet for Patients: BloggerCourse.com  Fact Sheet for Healthcare Providers: SeriousBroker.it  This test is not yet approved or cleared by the [place] FDA and has been authorized for detection and/or diagnosis of SARS-CoV-2 by FDA under an Emergency Use Authorization (EUA). This EUA will remain in effect (meaning this test can be used) for the duration of the COVID-19 declaration under Section 564(b)(1) of the Act, 21 U.S.C. section 360bbb-3(b)(1), unless the authorization is terminated or revoked.  Performed at [facility], [address], [place], [place] [address]   Blood culture (routine x 2)     Status: None   Collection Time: 04/23/23  7:58 PM   Specimen: BLOOD  Result Value Ref Range Status   Specimen Description   Final    BLOOD LEFT ANTECUBITAL Performed at [facility], [address] [place], [place] [address]    Special Requests   Final    BOTTLES DRAWN AEROBIC AND ANAEROBIC Blood Culture adequate volume Performed at [facility], [address] [place], [place] [address]    Culture   Final    NO GROWTH 5 DAYS Performed at [facility], [address], [place], [place] [address]    Report Status 04/28/2023 FINAL  Final  Blood culture (routine x 2)     Status: None   Collection Time: 04/23/23  8:10 PM   Specimen: BLOOD LEFT FOREARM  Result Value Ref Range Status   Specimen Description   Final    BLOOD LEFT FOREARM Performed at [facility], [address], [place], [place] [address]    Special Requests   Final    BOTTLES DRAWN AEROBIC AND ANAEROBIC Blood Culture results may not be optimal  due to an inadequate volume of blood received in culture bottles Performed at [facility], [address] [place], [place]  [address]    Culture   Final    NO GROWTH 5 DAYS Performed at [facility], [address], [place], [place] [address]    Report Status 04/28/2023 FINAL  Final    Radiology Studies: DG CHEST PORT 1 VIEW Result Date: 04/30/2023 CLINICAL DATA:  [id number] SOB (shortness of breath) [id number] EXAM: PORTABLE CHEST 1 VIEW COMPARISON:  Chest radiograph from one day prior. FINDINGS: Stable cardiomediastinal silhouette with normal heart size. No pneumothorax. No pleural effusion. Very low lung volumes, similar. No pulmonary edema. Hazy bibasilar lung opacities, slightly improved on the right and similar on the left. IMPRESSION: Very low lung volumes. Hazy bibasilar lung opacities, slightly improved on the right and similar on the left, favor atelectasis. Electronically Signed   By: Delbert Phenix M.D.   On: 04/30/2023 09:50   DG CHEST PORT 1 VIEW Result Date: 04/29/2023 CLINICAL DATA:  Shortness of breath. EXAM: PORTABLE CHEST 1 VIEW COMPARISON:  April 28, 2023. FINDINGS: Stable cardiomediastinal silhouette. Stable bibasilar opacities are noted concerning for atelectasis, infiltrates or edema. Bony thorax is unremarkable. IMPRESSION: Stable bibasilar opacities as noted above. Electronically Signed   By: Lupita Raider M.D.   On: 04/29/2023 08:21   Scheduled Meds:  amLODipine  10 mg Oral QPM   arformoterol  15 mcg Nebulization BID   budesonide (PULMICORT) nebulizer solution  0.5 mg Nebulization BID   Chlorhexidine Gluconate Cloth  6 each Topical Daily   enoxaparin (LOVENOX) injection  40 mg Subcutaneous Q24H   guaiFENesin  1,200 mg Oral BID   ipratropium  0.5 mg Nebulization TID   levalbuterol  0.63 mg Nebulization TID   losartan  100 mg Oral Daily   melatonin  3 mg Oral QHS   methylPREDNISolone (SOLU-MEDROL) injection  60 mg Intravenous Q12H   mouth rinse  15 mL Mouth Rinse 4 times per day   sodium chloride flush  3 mL Intravenous Q12H   sodium chloride HYPERTONIC  4 mL Nebulization BID    Continuous Infusions:   LOS: 7 days   Marguerita Merles, DO [facility] Available via Epic secure chat 7am-7pm After these hours, please refer to coverage provider listed on [email or web address] 04/30/2023, 2:00 PM

## 2023-04-30 NOTE — Progress Notes (Signed)
Physical Therapy Treatment Patient Details Name: Devin Castro MRN: [id number] DOB: 02/12/1951 Today's Date: 04/30/2023   History of Present Illness 72 yo male admitted 04/23/23 with Severe sepsis due to bilateral lower lobe pneumonia, leukocytosis, tachycardia, tachypnea, hypoxia and AKI in setting of bilateral lower lobe pneumonia as seen on CXR.  RSV is positive and patient is being treated for potential secondary bacterial pneumonia. PMH: COPD,HTN,PE, Bil. TKA    PT Comments  Pt very motivated and progressing well with mobility - up to ambulate 500' in hall with O2@ 15L and pt maintaining SaO2 above 93% and with max HR observed 113.  Pt     If plan is discharge home, recommend the following: A little help with walking and/or transfers;Assistance with cooking/housework;Assist for transportation;Help with stairs or ramp for entrance   Can travel by private vehicle        Equipment Recommendations  None recommended by PT    Recommendations for Other Services       Precautions / Restrictions Precautions Precautions: Fall;Other (comment) Precaution Comments: monitor O2 Restrictions Weight Bearing Restrictions Per Provider Order: No     Mobility  Bed Mobility               General bed mobility comments: Recieved up in chair and up in bathroom at session end    Transfers Overall transfer level: Needs assistance Equipment used: None Transfers: Sit to/from Stand Sit to Stand: Supervision           General transfer comment: cues for use of UEs to self assist    Ambulation/Gait Ambulation/Gait assistance: Contact guard assist Gait Distance (Feet): 500 Feet Assistive device: Rolling walker (2 wheels) Gait Pattern/deviations: Step-to pattern, Step-through pattern, Decreased step length - right, Decreased step length - left, Shuffle, Trunk flexed Gait velocity: decr     General Gait Details: cues for posture and position from [employer]              Wheelchair Mobility     Tilt Bed    Modified Rankin (Stroke Patients Only)       Balance Overall balance assessment: Mild deficits observed, not formally tested                                          Cognition Arousal: Alert Behavior During Therapy: WFL for tasks assessed/performed Overall Cognitive Status: Within Functional Limits for tasks assessed                                          Exercises      General Comments        Pertinent Vitals/Pain Pain Assessment Pain Assessment: No/denies pain    Home Living                          Prior Function            PT Goals (current goals can now be found in the care plan section) Acute Rehab PT Goals Patient Stated Goal: get back to work PT Goal Formulation: With patient Time For Goal Achievement: 05/11/23 Potential to Achieve Goals: Good Progress towards PT goals: Progressing toward goals    Frequency    Min 1X/week  PT Plan      Co-evaluation              AM-PAC PT "6 Clicks" Mobility   Outcome Measure  Help needed turning from your back to your side while in a flat bed without using bedrails?: A Little Help needed moving from lying on your back to sitting on the side of a flat bed without using bedrails?: A Little Help needed moving to and from a bed to a chair (including a wheelchair)?: A Little Help needed standing up from a chair using your arms (e.g., wheelchair or bedside chair)?: A Little Help needed to walk in hospital room?: A Little Help needed climbing 3-5 steps with a railing? : A Lot 6 Click Score: 17    End of Session Equipment Utilized During Treatment: Gait belt;Oxygen Activity Tolerance: Patient tolerated treatment well Patient left: Other (comment) (sitting in bathroom to bathe with nurse in attendance) Nurse Communication: Mobility status PT Visit Diagnosis: Unsteadiness on feet (R26.81);Difficulty in walking,  not elsewhere classified (R26.2)     Time: 1610-9604 PT Time Calculation (min) (ACUTE ONLY): 24 min  Charges:    $Gait Training: 23-37 mins PT General Charges $$ ACUTE PT VISIT: 1 Visit                     Mauro Kaufmann PT Acute Rehabilitation Services Pager [phone] Office [phone]    Camelia Stelzner 04/30/2023, 4:26 PM

## 2023-04-30 NOTE — Plan of Care (Signed)
POC and goals discussed with patient with time given for questions, patient able to demonstrate proper use of IS and flutter valve. Patient handbook/guide at bedside.  Problem: Fluid Volume: Goal: Hemodynamic stability will improve Outcome: Progressing   Problem: Clinical Measurements: Goal: Diagnostic test results will improve Outcome: Progressing Goal: Signs and symptoms of infection will decrease Outcome: Progressing   Problem: Respiratory: Goal: Ability to maintain adequate ventilation will improve Outcome: Progressing   Problem: Education: Goal: Knowledge of General Education information will improve Description: Including pain rating scale, medication(s)/side effects and non-pharmacologic comfort measures Outcome: Progressing   Problem: Health Behavior/Discharge Planning: Goal: Ability to manage health-related needs will improve Outcome: Progressing   Problem: Clinical Measurements: Goal: Ability to maintain clinical measurements within normal limits will improve Outcome: Progressing Goal: Will remain free from infection Outcome: Progressing Goal: Diagnostic test results will improve Outcome: Progressing Goal: Respiratory complications will improve Outcome: Progressing Goal: Cardiovascular complication will be avoided Outcome: Progressing   Problem: Activity: Goal: Risk for activity intolerance will decrease Outcome: Progressing   Problem: Nutrition: Goal: Adequate nutrition will be maintained Outcome: Progressing   Problem: Coping: Goal: Level of anxiety will decrease Outcome: Progressing   Problem: Elimination: Goal: Will not experience complications related to bowel motility Outcome: Progressing Goal: Will not experience complications related to urinary retention Outcome: Progressing   Problem: Pain Management: Goal: General experience of comfort will improve Outcome: Progressing   Problem: Safety: Goal: Ability to remain free from injury will  improve Outcome: Progressing   Problem: Skin Integrity: Goal: Risk for impaired skin integrity will decrease Outcome: Progressing

## 2023-04-30 NOTE — Progress Notes (Signed)
   04/30/23 2029  BiPAP/CPAP/SIPAP  Reason BIPAP/CPAP not in use Other(comment) (No need of bipap at this time, Pt is on HFNC and doing well on that, No resp distress)  BiPAP/CPAP /SiPAP Vitals  Pulse Rate 73  Resp 13  SpO2 97 %  Bilateral Breath Sounds Clear;Diminished  MEWS Score/Color  MEWS Score 1  MEWS Score Color Green

## 2023-04-30 NOTE — Consult Note (Signed)
   NAME:  Devin Castro, MRN:  [id number], DOB:  [birth year], LOS: 7 ADMISSION DATE:  04/23/2023, CONSULTATION DATE: 04/28/2023 REFERRING MD: Dr. Marland Mcalpine, CHIEF COMPLAINT: Hypoxemic respiratory failure  History of Present Illness:  Asked to see patient for hypoxemic respiratory failure Being treated for RSV pneumonia Worsening oxygen requirement  He has an underlying diagnosis of COPD, was not on any inhalers chronically, was not following up with anyone  COVID in 2020 for which she was hospitalized and went home on oxygen  Admitted with worsening shortness of breath Underlying history of COPD, pulmonary embolism, hypertension Came in with 1 week of cough shortness of breath Hypoxemia when it was initially evaluated  Pertinent  Medical History   Past Medical History:  Diagnosis Date   Arthritis    COPD (chronic obstructive pulmonary disease) (HCC)    Hypertension    Kidney stone    PE (pulmonary embolism)    Shortness of breath dyspnea    Vertigo     Significant Hospital Events: Including procedures, antibiotic start and stop dates in addition to other pertinent events   Chest x-ray 04/28/2023-bibasal infiltrates, worse compared to 12 7  Interim History / Subjective:  Slowly improving O2 requirement to 11L Eager to work with PT and sit in chair On nebulizer treatment  Declines BiPAP for support Objective   Blood pressure (!) 131/53, pulse 64, temperature (!) 97.2 F (36.2 C), temperature source Axillary, resp. rate 18, height 5\' 10"  (1.778 m), weight 97.5 kg, SpO2 92%.        Intake/Output Summary (Last 24 hours) at 04/30/2023 0849 Last data filed at 04/30/2023 0500 Gross per 24 hour  Intake 3 ml  Output 1600 ml  Net -1597 ml   Filed Weights   04/24/23 0350 04/25/23 0500 04/28/23 0516  Weight: 98.1 kg 98.4 kg 97.5 kg   Physical Exam: General: Well-appearing, no acute distress HENT: [place], [place] Eyes: EOMI, no scleral icterus Respiratory: Diminished to auscultation  bilaterally.  No crackles, wheezing or rales Cardiovascular: RRR, -M/R/G, no JVD Extremities:-Edema,-tenderness Neuro: AAO x4, CNII-XII grossly intact Psych: Normal mood, normal affect    Resolved Hospital Problem list     Assessment & Plan:  Acute hypoxemic respiratory failure RSV pneumonia -Increased oxygen requirement likely secondary to worsening infiltrate on chest x-ray -Markers of inflammation are getting better, procalcitonin is better -Overall leukocytosis improving -Continue current antibiotic therapy for total 7 days -Continue scheduled nebulizers: brovana, pulmicort and duonebs -Encourage BiPAP as he likely has underlying OSA but patient declines -Pulmonary hygiene including flutter valve and IS, OOB as tolerated  Underlying chronic obstructive pulmonary disease, was not on any inhaler -Continue bronchodilator therapy -Will need inhalers when he is going home  RSV positive -Supportive  Care Time: 25 min  Mechele Collin, M.D. [facility] 04/30/2023 8:49 AM   See Amion for personal pager For hours between 7 PM to 7 AM, please call Elink for urgent questions

## 2023-05-01 ENCOUNTER — Inpatient Hospital Stay (HOSPITAL_COMMUNITY): Payer: PPO

## 2023-05-01 DIAGNOSIS — J441 Chronic obstructive pulmonary disease with (acute) exacerbation: Secondary | ICD-10-CM | POA: Diagnosis not present

## 2023-05-01 DIAGNOSIS — J9601 Acute respiratory failure with hypoxia: Secondary | ICD-10-CM | POA: Diagnosis not present

## 2023-05-01 DIAGNOSIS — A419 Sepsis, unspecified organism: Secondary | ICD-10-CM | POA: Diagnosis not present

## 2023-05-01 DIAGNOSIS — J189 Pneumonia, unspecified organism: Secondary | ICD-10-CM | POA: Diagnosis not present

## 2023-05-01 LAB — COMPREHENSIVE METABOLIC PANEL
ALT: 49 U/L — ABNORMAL HIGH (ref 0–44)
AST: 29 U/L (ref 15–41)
Albumin: 2.5 g/dL — ABNORMAL LOW (ref 3.5–5.0)
Alkaline Phosphatase: 41 U/L (ref 38–126)
Anion gap: 7 (ref 5–15)
BUN: 31 mg/dL — ABNORMAL HIGH (ref 8–23)
CO2: 25 mmol/L (ref 22–32)
Calcium: 8.5 mg/dL — ABNORMAL LOW (ref 8.9–10.3)
Chloride: 103 mmol/L (ref 98–111)
Creatinine, Ser: 0.71 mg/dL (ref 0.61–1.24)
GFR, Estimated: >60 mL/min (ref >60)
Glucose, Bld: 133 mg/dL — ABNORMAL HIGH (ref 70–99)
Potassium: 4.9 mmol/L (ref 3.5–5.1)
Sodium: 135 mmol/L (ref 135–145)
Total Bilirubin: 0.7 mg/dL (ref <1.2)
Total Protein: 6 g/dL — ABNORMAL LOW (ref 6.5–8.1)

## 2023-05-01 LAB — CBC WITH DIFFERENTIAL/PLATELET
Abs Immature Granulocytes: 0.72 10*3/uL — ABNORMAL HIGH (ref 0.00–0.07)
Basophils Absolute: 0.1 10*3/uL (ref 0.0–0.1)
Basophils Relative: 1 %
Eosinophils Absolute: 0 10*3/uL (ref 0.0–0.5)
Eosinophils Relative: 0 %
HCT: 43.9 % (ref 39.0–52.0)
Hemoglobin: 14.1 g/dL (ref 13.0–17.0)
Immature Granulocytes: 6 %
Lymphocytes Relative: 2 %
Lymphs Abs: 0.2 10*3/uL — ABNORMAL LOW (ref 0.7–4.0)
MCH: 33.3 pg (ref 26.0–34.0)
MCHC: 32.1 g/dL (ref 30.0–36.0)
MCV: 103.5 fL — ABNORMAL HIGH (ref 80.0–100.0)
Monocytes Absolute: 0.3 10*3/uL (ref 0.1–1.0)
Monocytes Relative: 3 %
Neutro Abs: 11.3 10*3/uL — ABNORMAL HIGH (ref 1.7–7.7)
Neutrophils Relative %: 88 %
Platelets: 513 10*3/uL — ABNORMAL HIGH (ref 150–400)
RBC: 4.24 MIL/uL (ref 4.22–5.81)
RDW: 12.8 % (ref 11.5–15.5)
WBC: 12.6 10*3/uL — ABNORMAL HIGH (ref 4.0–10.5)
nRBC: 0 % (ref 0.0–0.2)

## 2023-05-01 LAB — MAGNESIUM: Magnesium: 2.3 mg/dL (ref 1.7–2.4)

## 2023-05-01 LAB — PHOSPHORUS: Phosphorus: 4.4 mg/dL (ref 2.5–4.6)

## 2023-05-01 NOTE — Progress Notes (Signed)
PROGRESS NOTE    TANISHA EDELMAN  EAV:[id number] DOB: 02/08/1951 DOA: 04/23/2023 PCP: Eartha Inch, MD   Brief Narrative:  Devin Castro is a 72 y.o. male with medical history significant for COPD, Hx of provoked PE in 2016 completed 6 months Xarelto, HTN, HLD who is admitted with severe sepsis and acute hypoxic respiratory failure due to bilateral lower lobe pneumonia and COPD exacerbation and was also found to have RSV infection.  Currently he is slowly improving but continues to still require quite a bit of oxygen.  Will continue oxygen weaning and have made further medication adjustments but his oxygen is worsening.    Given his worsening pulmonary status the pulmonary team was consulted for further evaluation recommending continuing current antibiotics and supportive care.  Antibiotics have now finished.  PCCM did discuss with him about BiPAP support if needed and then if unable to tolerate possible ventilator.  He is slowly improving and now weaned down to 9 L today compared to yesterday.  Assessment and Plan:  Severe Sepsis due to Bilateral Lower Lobe Pneumonia, POA: -Presenting with leukocytosis, tachycardia, tachypnea, hypoxia and AKI in setting of bilateral lower lobe pneumonia as seen on CXR.   -RSV is positive and patient is being treated for potential secondary bacterial pneumonia.  -PCT 3.84 -> 2.65 -> 0.13 -WBC Trend: Recent Labs  Lab 04/23/23 1947 04/24/23 0614 04/27/23 0248 04/28/23 0304 04/29/23 0252 04/30/23 0311 05/01/23 0311  WBC 22.7* 19.3* 15.3* 15.5* 16.6* 14.3* 12.6*  -Continue Abx with IV Ceftriaxone and Azithromycin and per PCCM will need to continue for 7 days. PCT improved . WBC not likely to be reliable in setting of systemic steroids. -Lactic Acid Level Trend: Recent Labs  Lab 04/23/23 1958 04/23/23 2201  LATICACIDVEN 2.1* 1.4  -Send sputum culture (if able) -C/w Blood Culture monitoring (NGTD x 5 Days).  -Urine antigens still pending with urine  Legionella Pneumophila Serogp 1 Ur Ag and Strep Pneumoniae Urine Antigen. Between his recent cruise, hyponatremia, GI symptoms, and slow to resolve, legionnaire's is not yet ruled out. -No further IVF required. LA has cleared. May need to reinitiate if not taking po today. -See below -Repeat CXR this AM as below and will need to Repeat in the AM -O2 requirements still quite high but weaning is happening.  Will place him back on a diet given that he is doing better. -Pulmonary recommending to encourage BiPAP as a likely has underlying OSA but patient continues to decline.  They are recommending aggressive pulmonary hygiene currently.   Acute Respiratory Failure with Hypoxia Due to AECOPD, RSV bronchiolitis/pneumonitis, and superimposed bacterial PNA.  -Continue supplemental oxygen to maintain SpO2 >89% and normal respiratory effort while treating conditions as outlined.  SpO2: 96 % O2 Flow Rate (L/min): 9 L/min FiO2 (%): 50 %; O2 Requirements are slightly improved and now down to 9 L today and had been dropped to 7 earlier but became more dyspneic so had to be bumped up back to 9; was on 11 yesterday ambulatory today with 15 L -Remains in SDU while still requiring high oxygen supplementation. -C/w  IS, FV and will Increase Guaifenesin 1200 mg po BID and continue Benzonatate 100 mg po PRN Cough -C/w arformoterol 15 mcg nebs twice daily, budesonide 0.25 mg nebs twice daily, and DuoNeb 3 mL every 6 as needed for wheezing or shortness of breath and will add Xopenex and Atrovent scheduled every 6h -Continue with IV steroids and increase the dose from 40 mg every 12  to 60 mg every 12 -Continue to monitor respiratory status carefully and continue supplemental oxygen via nasal cannula and wean O2 as tolerated-continuous pulse oximetry maintain O2 saturation greater 90% -Will need an ambulatory home O2 screen prior to discharge -Repeat Chest Xray done and showed "Low lung volumes with bibasilar opacities  which may reflect areas of atelectasis and/or consolidation and small bilateral pleural effusions." -Pulmonary consulted for further evaluation and recommendations and his markers of inflammation are getting better his procalcitonin is better and they are recommending continuing current antibiotic therapy and recommending inhalers at discharge as well.  Dr. Wynona Neat did discuss need for possible BiPAP if the patient is unable to maintain saturations in patient is claustrophobic but aware that if he is unable to tolerate BiPAP that they may end up having to place him on a ventilator   RSV Infection and Pneumonia -Supportive care, Droplet precautions   AECOPD -Continue scheduled and prn bronchodilators as above.  -Continue IV solumedrol. Wheezing improved, but not yet resolved.    Bibasilar Pneumonia -See above -Repeat CXR done and showed " Expiratory exam with decreased lung excursion. Increased opacity in the hypoinflated lower lung fields which could be atelectasis, pneumonia or combination. Small area of new increased opacity laterally in the right upper lobe concerning for a new infiltrate." -Pulmonary Consulted for further evaluation and recommendations and is very slowly improving and was weaned to 9 Liters; Will transfer to the Progressive Unit    AKI, improved -BUN/Cr  now 31/0.71 -Initially held Losartan, but BP is quite elevated and Cr normal, restarted ARB with Losartan 100 mg po Daily -Avoid Nephrotoxic Medications, Contrast Dyes, Hypotension and Dehydration to Ensure Adequate Renal Perfusion and will need to Renally Adjust Meds -Continue to Monitor and Trend Renal Function carefully and repeat CMP in the AM   Essential Hypertension -Continue with Amlodipine 10 mg p.o. daily, Losartan 100 g p.o. daily -Continue to Monitor blood pressures per protocol -Last BP reading still elevated at 127/71  Hyponatremia, mild -Na+ Trending from 131-137 with it being 135 today -Continue to  Monitor and Trend now that it dropped to 131 and is the same -Repeat CMP in the AM   Abnormal LFTs, improved -AST and ALT improved but now ALT is mildly elevated at 49: -Improved. Continue to Monitor and Trend and repeat CMP in the AM and if worsens again we will need a right upper ultrasound and acute hepatitis panel  Macrocytic Anemia -? Spurious Result when it was 12.0/35.4 with an MCV of 102 on 04/24/23. Hgb relatively stable around 14 on the last 5 checks  -Checked Anemia Panel and showed an iron level 47, UIBC 143, TIBC 190, saturation ratios of 25%, ferritin of 567, folate of 6.1 and vitamin B12 1366 -Continue to Monitor for S/Sx of Bleeding; No overt bleeding noted -Repeat CBC in the AM   Thrombocytosis -Likely reactive -Platelet Count Trending from 295-522 with it being 513 this AM -Continue to Monitor and Trend and repeat CMP in the AM  Hypoalbuminemia -Patient's Albumin Trending from 2.2-2.9 -Continue to Monitor and Trend and repeat CMP in the AM  Class I Obesity -Complicates overall prognosis and care -Estimated body mass index is 30.84 kg/m as calculated from the following:   Height as of this encounter: 5\' 10"  (1.778 m).   Weight as of this encounter: 97.5 kg.  -Weight Loss and Dietary Counseling given   DVT prophylaxis: enoxaparin (LOVENOX) injection 40 mg Start: 04/23/23 2200    Code Status: Full Code  Family Communication: No family currently at bedside  Disposition Plan:  Level of care: Progressive Status is: Inpatient Remains inpatient appropriate because: He is slowly improving and will be transferred to the progressive care unit for further evaluation and management   Consultants:  Pulmonary  Procedures:  As delineated as above  Antimicrobials:  Anti-infectives (From admission, onward)    Start     Dose/Rate Route Frequency Ordered Stop   04/24/23 0800  cefTRIAXone (ROCEPHIN) 2 g in sodium chloride 0.9 % 100 mL IVPB        2 g 200 mL/hr over 30  Minutes Intravenous Every 24 hours 04/23/23 2136 04/28/23 0912   04/23/23 2015  azithromycin (ZITHROMAX) 500 mg in sodium chloride 0.9 % 250 mL IVPB        500 mg 250 mL/hr over 60 Minutes Intravenous Every 24 hours 04/23/23 2000 04/27/23 2222   04/23/23 2015  cefTRIAXone (ROCEPHIN) 1 g in sodium chloride 0.9 % 100 mL IVPB        1 g 200 mL/hr over 30 Minutes Intravenous  Once 04/23/23 2004 04/23/23 2031   04/23/23 2000  cefTRIAXone (ROCEPHIN) injection 1 g  Status:  Discontinued        1 g Intramuscular  Once 04/23/23 1957 04/23/23 2004       Subjective: Seen and examined at bedside and thinks his respiratory status doing a bit better and was weaned down to at least 7 L while at rest and when he started talking had to be bumped up back to 9 L.  No nausea or vomiting.  No any chest pain.  No lightheadedness or dizziness.  No other concerns or complaints this time.  Objective: Vitals:   05/01/23 1123 05/01/23 1129 05/01/23 1130 05/01/23 1136  BP:   135/63   Pulse:   73   Resp:   (!) 24   Temp:   97.6 F (36.4 C)   TempSrc:   Oral   SpO2: 95% 94% 95% 96%  Weight:      Height:        Intake/Output Summary (Last 24 hours) at 05/01/2023 1238 Last data filed at 05/01/2023 1129 Gross per 24 hour  Intake 718 ml  Output 600 ml  Net 118 ml   Filed Weights   04/24/23 0350 04/25/23 0500 04/28/23 0516  Weight: 98.1 kg 98.4 kg 97.5 kg   Examination: Physical Exam:  Constitutional: WN/WD obese elderly Caucasian male in no acute distress Respiratory: Diminished to auscultation bilaterally with some coarse breath sounds and does have some rhonchi and some slight wheezing but no appreciable crackles or rales. Normal respiratory effort and patient is not tachypenic. No accessory muscle use.  Wearing supplemental oxygen via nasal cannula and is on 9 L Cardiovascular: RRR, no murmurs / rubs / gallops. S1 and S2 auscultated. No extremity edema. Abdomen: Soft, non-tender, distended secondary  body habitus. Bowel sounds positive.  GU: Deferred. Musculoskeletal: No clubbing / cyanosis of digits/nails. No joint deformity upper and lower extremities.  Skin: No rashes, lesions, ulcers on a limited evaluation. No induration; Warm and dry.  Neurologic: CN 2-12 grossly intact with no focal deficits. Romberg sign and cerebellar reflexes not assessed.  Psychiatric: Normal judgment and insight. Alert and oriented x 3. Normal mood and appropriate affect.   Data Reviewed: I have personally reviewed following labs and imaging studies  CBC: Recent Labs  Lab 04/27/23 0248 04/28/23 0304 04/29/23 0252 04/30/23 0311 05/01/23 0311  WBC 15.3* 15.5* 16.6* 14.3* 12.6*  NEUTROABS  --  14.0* 14.8* 12.7* 11.3*  HGB 14.0 14.2 14.3 14.0 14.1  HCT 42.9 42.5 44.4 41.7 43.9  MCV 101.9* 102.2* 103.3* 102.2* 103.5*  PLT 486* 503* 521* 522* 513*   Basic Metabolic Panel: Recent Labs  Lab 04/27/23 0248 04/28/23 0304 04/29/23 0252 04/30/23 0311 05/01/23 0311  NA 134* 135 131* 131* 135  K 4.4 4.3 4.7 4.8 4.9  CL 102 103 99 100 103  CO2 24 22 24 24 25   GLUCOSE 133* 138* 129* 133* 133*  BUN 26* 23 22 26* 31*  CREATININE 0.76 0.67 0.51* 0.68 0.71  CALCIUM 8.2* 8.3* 8.4* 8.3* 8.5*  MG  --  2.1 2.2 2.2 2.3  PHOS  --  3.1 3.9 4.1 4.4   GFR: Estimated Creatinine Clearance: 100.6 mL/min (by C-G formula based on SCr of 0.71 mg/dL). Liver Function Tests: Recent Labs  Lab 04/28/23 0304 04/29/23 0252 04/30/23 0311 05/01/23 0311  AST 21 20 21 29   ALT 38 36 38 49*  ALKPHOS 48 47 42 41  BILITOT 0.7 0.6 0.5 0.7  PROT 6.5 6.4* 6.1* 6.0*  ALBUMIN 2.4* 2.4* 2.5* 2.5*   No results for input(s): "LIPASE", "AMYLASE" in the last 168 hours. No results for input(s): "AMMONIA" in the last 168 hours. Coagulation Profile: No results for input(s): "INR", "PROTIME" in the last 168 hours. Cardiac Enzymes: No results for input(s): "CKTOTAL", "CKMB", "CKMBINDEX", "TROPONINI" in the last 168 hours. BNP (last 3  results) No results for input(s): "PROBNP" in the last 8760 hours. HbA1C: No results for input(s): "HGBA1C" in the last 72 hours. CBG: No results for input(s): "GLUCAP" in the last 168 hours. Lipid Profile: No results for input(s): "CHOL", "HDL", "LDLCALC", "TRIG", "CHOLHDL", "LDLDIRECT" in the last 72 hours. Thyroid Function Tests: No results for input(s): "TSH", "T4TOTAL", "FREET4", "T3FREE", "THYROIDAB" in the last 72 hours. Anemia Panel: No results for input(s): "VITAMINB12", "FOLATE", "FERRITIN", "TIBC", "IRON", "RETICCTPCT" in the last 72 hours. Sepsis Labs: Recent Labs  Lab 04/25/23 0258 04/28/23 0304  PROCALCITON 2.65 0.13   Recent Results (from the past 240 hours)  Resp panel by RT-PCR (RSV, Flu A&B, Covid) Anterior Nasal Swab     Status: Abnormal   Collection Time: 04/23/23  7:47 PM   Specimen: Anterior Nasal Swab  Result Value Ref Range Status   SARS Coronavirus 2 by RT PCR NEGATIVE NEGATIVE Final    Comment: (NOTE) SARS-CoV-2 target nucleic acids are NOT DETECTED.  The SARS-CoV-2 RNA is generally detectable in upper respiratory specimens during the acute phase of infection. The lowest concentration of SARS-CoV-2 viral copies this assay can detect is 138 copies/mL. A negative result does not preclude SARS-Cov-2 infection and should not be used as the sole basis for treatment or other patient management decisions. A negative result may occur with  improper specimen collection/handling, submission of specimen other than nasopharyngeal swab, presence of viral mutation(s) within the areas targeted by this assay, and inadequate number of viral copies(<138 copies/mL). A negative result must be combined with clinical observations, patient history, and epidemiological information. The expected result is Negative.  Fact Sheet for Patients:  BloggerCourse.com  Fact Sheet for Healthcare Providers:  SeriousBroker.it  This  test is no t yet approved or cleared by the [place] FDA and  has been authorized for detection and/or diagnosis of SARS-CoV-2 by FDA under an Emergency Use Authorization (EUA). This EUA will remain  in effect (meaning this test can be used) for the duration of the COVID-19 declaration under  Section 564(b)(1) of the Act, 21 U.S.C.section 360bbb-3(b)(1), unless the authorization is terminated  or revoked sooner.       Influenza A by PCR NEGATIVE NEGATIVE Final   Influenza B by PCR NEGATIVE NEGATIVE Final    Comment: (NOTE) The Xpert Xpress SARS-CoV-2/FLU/RSV plus assay is intended as an aid in the diagnosis of influenza from Nasopharyngeal swab specimens and should not be used as a sole basis for treatment. Nasal washings and aspirates are unacceptable for Xpert Xpress SARS-CoV-2/FLU/RSV testing.  Fact Sheet for Patients: BloggerCourse.com  Fact Sheet for Healthcare Providers: SeriousBroker.it  This test is not yet approved or cleared by the [place] FDA and has been authorized for detection and/or diagnosis of SARS-CoV-2 by FDA under an Emergency Use Authorization (EUA). This EUA will remain in effect (meaning this test can be used) for the duration of the COVID-19 declaration under Section 564(b)(1) of the Act, 21 U.S.C. section 360bbb-3(b)(1), unless the authorization is terminated or revoked.     Resp Syncytial Virus by PCR POSITIVE (A) NEGATIVE Final    Comment: (NOTE) Fact Sheet for Patients: BloggerCourse.com  Fact Sheet for Healthcare Providers: SeriousBroker.it  This test is not yet approved or cleared by the [place] FDA and has been authorized for detection and/or diagnosis of SARS-CoV-2 by FDA under an Emergency Use Authorization (EUA). This EUA will remain in effect (meaning this test can be used) for the duration of the COVID-19 declaration under  Section 564(b)(1) of the Act, 21 U.S.C. section 360bbb-3(b)(1), unless the authorization is terminated or revoked.  Performed at [facility], [address], [place], [place] [address]   Blood culture (routine x 2)     Status: None   Collection Time: 04/23/23  7:58 PM   Specimen: BLOOD  Result Value Ref Range Status   Specimen Description   Final    BLOOD LEFT ANTECUBITAL Performed at [facility], [address] [place], [place] [address]    Special Requests   Final    BOTTLES DRAWN AEROBIC AND ANAEROBIC Blood Culture adequate volume Performed at [facility], [address] [place], [place] [address]    Culture   Final    NO GROWTH 5 DAYS Performed at [facility], [address], [place], [place] [address]    Report Status 04/28/2023 FINAL  Final  Blood culture (routine x 2)     Status: None   Collection Time: 04/23/23  8:10 PM   Specimen: BLOOD LEFT FOREARM  Result Value Ref Range Status   Specimen Description   Final    BLOOD LEFT FOREARM Performed at [facility], [address] [place], [place] [address]    Special Requests   Final    BOTTLES DRAWN AEROBIC AND ANAEROBIC Blood Culture results may not be optimal due to an inadequate volume of blood received in culture bottles Performed at [facility], [address], [place], [place] [address]    Culture   Final    NO GROWTH 5 DAYS Performed at [facility], [address] [place], [place] [address]    Report Status 04/28/2023 FINAL  Final    Radiology Studies: DG CHEST PORT 1 VIEW Result Date: 05/01/2023 CLINICAL DATA:  72 year old male with history of shortness of breath. EXAM: PORTABLE CHEST 1 VIEW COMPARISON:  Chest x-ray 04/30/2023. FINDINGS: Lung volumes are low. Mild elevation of the right hemidiaphragm. Small bilateral pleural effusions. Bibasilar opacities which may  reflect areas of atelectasis and/or  consolidation. No pneumothorax. No evidence of pulmonary edema. Heart size is normal. Upper mediastinal contours are within normal limits. IMPRESSION: 1. Low lung volumes with bibasilar opacities which may reflect areas of atelectasis and/or consolidation and small bilateral pleural effusions. Electronically Signed   By: Trudie Reed M.D.   On: 05/01/2023 10:23   DG CHEST PORT 1 VIEW Result Date: 04/30/2023 CLINICAL DATA:  [id number] SOB (shortness of breath) [id number] EXAM: PORTABLE CHEST 1 VIEW COMPARISON:  Chest radiograph from one day prior. FINDINGS: Stable cardiomediastinal silhouette with normal heart size. No pneumothorax. No pleural effusion. Very low lung volumes, similar. No pulmonary edema. Hazy bibasilar lung opacities, slightly improved on the right and similar on the left. IMPRESSION: Very low lung volumes. Hazy bibasilar lung opacities, slightly improved on the right and similar on the left, favor atelectasis. Electronically Signed   By: Delbert Phenix M.D.   On: 04/30/2023 09:50   Scheduled Meds:  amLODipine  10 mg Oral QPM   arformoterol  15 mcg Nebulization BID   budesonide (PULMICORT) nebulizer solution  0.5 mg Nebulization BID   Chlorhexidine Gluconate Cloth  6 each Topical Daily   enoxaparin (LOVENOX) injection  40 mg Subcutaneous Q24H   guaiFENesin  1,200 mg Oral BID   ipratropium  0.5 mg Nebulization TID   levalbuterol  0.63 mg Nebulization TID   losartan  100 mg Oral Daily   melatonin  3 mg Oral QHS   methylPREDNISolone (SOLU-MEDROL) injection  60 mg Intravenous Q12H   mouth rinse  15 mL Mouth Rinse 4 times per day   sodium chloride flush  3 mL Intravenous Q12H   sodium chloride HYPERTONIC  4 mL Nebulization BID   Continuous Infusions:   LOS: 8 days   Marguerita Merles, DO [facility] Available via Epic secure chat 7am-7pm After these hours, please refer to coverage provider listed on [email or web address] 05/01/2023, 12:38 PM

## 2023-05-01 NOTE — Consult Note (Signed)
   NAME:  Devin Castro, MRN:  [id number], DOB:  08-04-1950, LOS: 8 ADMISSION DATE:  04/23/2023, CONSULTATION DATE: 04/28/2023 REFERRING MD: Dr. Marland Mcalpine, CHIEF COMPLAINT: Hypoxemic respiratory failure  History of Present Illness:  Asked to see patient for hypoxemic respiratory failure Being treated for RSV pneumonia Worsening oxygen requirement  He has an underlying diagnosis of COPD, was not on any inhalers chronically, was not following up with anyone  COVID in 2020 for which she was hospitalized and went home on oxygen  Admitted with worsening shortness of breath Underlying history of COPD, pulmonary embolism, hypertension Came in with 1 week of cough shortness of breath Hypoxemia when it was initially evaluated  Pertinent  Medical History   Past Medical History:  Diagnosis Date   Arthritis    COPD (chronic obstructive pulmonary disease) (HCC)    Hypertension    Kidney stone    PE (pulmonary embolism)    Shortness of breath dyspnea    Vertigo     Significant Hospital Events: Including procedures, antibiotic start and stop dates in addition to other pertinent events   Chest x-ray 04/28/2023-bibasal infiltrates, worse compared to 12 7  Interim History / Subjective:  Weaned to 7L O2 Welcome today Sat in chair yesterday  Declines BiPAP for support Objective   Blood pressure 124/67, pulse (!) 104, temperature (!) 97.5 F (36.4 C), temperature source Oral, resp. rate 15, height 5\' 10"  (1.778 m), weight 97.5 kg, SpO2 91%.        Intake/Output Summary (Last 24 hours) at 05/01/2023 0957 Last data filed at 05/01/2023 4098 Gross per 24 hour  Intake 600 ml  Output 600 ml  Net 0 ml   Filed Weights   04/24/23 0350 04/25/23 0500 04/28/23 0516  Weight: 98.1 kg 98.4 kg 97.5 kg   Physical Exam: General: Well-appearing, no acute distress HENT: [place], AT Eyes: EOMI, no scleral icterus Respiratory: Diminished but clear to auscultation bilaterally.  No crackles, wheezing or  rales Cardiovascular: RRR, -M/R/G, no JVD Extremities:-Edema,-tenderness Neuro: AAO x4, CNII-XII grossly intact Psych: Normal mood, normal affect   WBC improving to 12.6  Resolved Hospital Problem list     Assessment & Plan:  Acute hypoxemic respiratory failure RSV pneumonia Hx covid 2020 -Increased oxygen requirement likely secondary to worsening infiltrate on chest x-ray -Markers of inflammation are getting better, procalcitonin is better -Overall leukocytosis improving -S/ CAP treatment for total 5 days -Continue scheduled nebulizers: brovana, pulmicort and duonebs -Encourage BiPAP as he likely has underlying OSA but patient declines -Pulmonary hygiene including flutter valve and IS, OOB as tolerated  Underlying chronic obstructive pulmonary disease, was not on any inhaler -Continue bronchodilator therapy -Will need inhalers when he is going home -Will need PFTs and pulmonary follow-up prior to discharge  RSV positive -Supportive  Care Time: 26 min  Mechele Collin, M.D. [facility] Pulmonary/Critical Care Medicine 05/01/2023 9:57 AM   See Amion for personal pager For hours between 7 PM to 7 AM, please call Elink for urgent questions

## 2023-05-01 NOTE — Plan of Care (Signed)
Patient sitting up in chair at this time, discuss done on POC and goals for tonight, time given for questions, patient demonstrating proper technique with IC and the flutter valve, patient currently on 9 liters HFNC.  Problem: Fluid Volume: Goal: Hemodynamic stability will improve Outcome: Progressing   Problem: Clinical Measurements: Goal: Diagnostic test results will improve Outcome: Progressing Goal: Signs and symptoms of infection will decrease Outcome: Progressing   Problem: Respiratory: Goal: Ability to maintain adequate ventilation will improve Outcome: Progressing   Problem: Education: Goal: Knowledge of General Education information will improve Description: Including pain rating scale, medication(s)/side effects and non-pharmacologic comfort measures Outcome: Progressing   Problem: Health Behavior/Discharge Planning: Goal: Ability to manage health-related needs will improve Outcome: Progressing   Problem: Clinical Measurements: Goal: Ability to maintain clinical measurements within normal limits will improve Outcome: Progressing Goal: Will remain free from infection Outcome: Progressing Goal: Diagnostic test results will improve Outcome: Progressing Goal: Respiratory complications will improve Outcome: Progressing Goal: Cardiovascular complication will be avoided Outcome: Progressing   Problem: Activity: Goal: Risk for activity intolerance will decrease Outcome: Progressing   Problem: Nutrition: Goal: Adequate nutrition will be maintained Outcome: Progressing   Problem: Coping: Goal: Level of anxiety will decrease Outcome: Progressing   Problem: Elimination: Goal: Will not experience complications related to bowel motility Outcome: Progressing Goal: Will not experience complications related to urinary retention Outcome: Progressing   Problem: Pain Management: Goal: General experience of comfort will improve Outcome: Progressing   Problem:  Safety: Goal: Ability to remain free from injury will improve Outcome: Progressing   Problem: Skin Integrity: Goal: Risk for impaired skin integrity will decrease Outcome: Progressing

## 2023-05-01 NOTE — Progress Notes (Signed)
   05/01/23 0853  Oxygen Therapy/Pulse Ox  O2 Device HFNC  O2 Therapy (S)  Oxygen humidified (Refilled H20 bottle, safety valve checked.)  O2 Flow Rate (L/min) (S)  7 L/min (Weaned to 7 L HFNC (salter))  SpO2 97 %  Safety Instructions Yes (Comment)

## 2023-05-01 NOTE — Progress Notes (Signed)
Physical Therapy Treatment Patient Details Name: Devin Castro MRN: [id number] DOB: Dec 24, 1950 Today's Date: 05/01/2023   History of Present Illness 72 yo male admitted 04/23/23 with Severe sepsis due to bilateral lower lobe pneumonia, leukocytosis, tachycardia, tachypnea, hypoxia and AKI in setting of bilateral lower lobe pneumonia as seen on CXR.  RSV is positive and patient is being treated for potential secondary bacterial pneumonia. PMH: COPD,HTN,PE, Bil. TKA    PT Comments  Pt continues very cooperative but appears to be more fatigued this am with noted decreased activity tolerance and HR elevated slightly higher (max 127) and with O2 sats slightly lower (upper 80s to lower 90s)on same 15L O2.    If plan is discharge home, recommend the following: A little help with walking and/or transfers;Assistance with cooking/housework;Assist for transportation;Help with stairs or ramp for entrance   Can travel by private vehicle        Equipment Recommendations  None recommended by PT    Recommendations for Other Services       Precautions / Restrictions Precautions Precautions: Fall;Other (comment) Precaution Comments: monitor O2 Restrictions Weight Bearing Restrictions Per Provider Order: No     Mobility  Bed Mobility Overal bed mobility: Modified Independent                  Transfers Overall transfer level: Needs assistance Equipment used: None Transfers: Sit to/from Stand Sit to Stand: Supervision           General transfer comment: cues for use of UEs to self assist    Ambulation/Gait Ambulation/Gait assistance: Contact guard assist Gait Distance (Feet): 350 Feet Assistive device: Rolling walker (2 wheels) Gait Pattern/deviations: Step-to pattern, Step-through pattern, Decreased step length - right, Decreased step length - left, Shuffle, Trunk flexed Gait velocity: decr     General Gait Details: cues for posture and position from [employer]              Wheelchair Mobility     Tilt Bed    Modified Rankin (Stroke Patients Only)       Balance Overall balance assessment: Mild deficits observed, not formally tested                                          Cognition Arousal: Alert Behavior During Therapy: WFL for tasks assessed/performed Overall Cognitive Status: Within Functional Limits for tasks assessed                                          Exercises      General Comments        Pertinent Vitals/Pain Pain Assessment Pain Assessment: No/denies pain    Home Living                          Prior Function            PT Goals (current goals can now be found in the care plan section) Acute Rehab PT Goals Patient Stated Goal: get back to work PT Goal Formulation: With patient Time For Goal Achievement: 05/11/23 Potential to Achieve Goals: Good Progress towards PT goals: Not progressing toward goals - comment (increased fatigue this date)    Frequency    Min 1X/week  PT Plan      Co-evaluation              AM-PAC PT "6 Clicks" Mobility   Outcome Measure  Help needed turning from your back to your side while in a flat bed without using bedrails?: None Help needed moving from lying on your back to sitting on the side of a flat bed without using bedrails?: None Help needed moving to and from a bed to a chair (including a wheelchair)?: A Little Help needed standing up from a chair using your arms (e.g., wheelchair or bedside chair)?: A Little Help needed to walk in hospital room?: A Little Help needed climbing 3-5 steps with a railing? : A Lot 6 Click Score: 19    End of Session Equipment Utilized During Treatment: Gait belt;Oxygen Activity Tolerance: Patient tolerated treatment well;Patient limited by fatigue Patient left: in chair Nurse Communication: Mobility status PT Visit Diagnosis: Unsteadiness on feet (R26.81);Difficulty in  walking, not elsewhere classified (R26.2)     Time: 1008-1030 PT Time Calculation (min) (ACUTE ONLY): 22 min  Charges:    $Gait Training: 8-22 mins PT General Charges $$ ACUTE PT VISIT: 1 Visit                     Devin Castro PT Acute Rehabilitation Services Pager [phone] Office [phone]    Devin Castro 05/01/2023, 1:44 PM

## 2023-05-02 ENCOUNTER — Inpatient Hospital Stay (HOSPITAL_COMMUNITY): Payer: PPO

## 2023-05-02 DIAGNOSIS — Z8616 Personal history of COVID-19: Secondary | ICD-10-CM

## 2023-05-02 DIAGNOSIS — J210 Acute bronchiolitis due to respiratory syncytial virus: Secondary | ICD-10-CM

## 2023-05-02 DIAGNOSIS — N179 Acute kidney failure, unspecified: Secondary | ICD-10-CM | POA: Diagnosis not present

## 2023-05-02 DIAGNOSIS — A419 Sepsis, unspecified organism: Secondary | ICD-10-CM | POA: Diagnosis not present

## 2023-05-02 DIAGNOSIS — J9601 Acute respiratory failure with hypoxia: Secondary | ICD-10-CM | POA: Diagnosis not present

## 2023-05-02 DIAGNOSIS — J189 Pneumonia, unspecified organism: Secondary | ICD-10-CM | POA: Diagnosis not present

## 2023-05-02 LAB — CBC WITH DIFFERENTIAL/PLATELET
Abs Immature Granulocytes: 0.78 10*3/uL — ABNORMAL HIGH (ref 0.00–0.07)
Basophils Absolute: 0.1 10*3/uL (ref 0.0–0.1)
Basophils Relative: 1 %
Eosinophils Absolute: 0 10*3/uL (ref 0.0–0.5)
Eosinophils Relative: 0 %
HCT: 45.1 % (ref 39.0–52.0)
Hemoglobin: 14.3 g/dL (ref 13.0–17.0)
Immature Granulocytes: 7 %
Lymphocytes Relative: 2 %
Lymphs Abs: 0.3 10*3/uL — ABNORMAL LOW (ref 0.7–4.0)
MCH: 33.3 pg (ref 26.0–34.0)
MCHC: 31.7 g/dL (ref 30.0–36.0)
MCV: 104.9 fL — ABNORMAL HIGH (ref 80.0–100.0)
Monocytes Absolute: 0.4 10*3/uL (ref 0.1–1.0)
Monocytes Relative: 3 %
Neutro Abs: 10.1 10*3/uL — ABNORMAL HIGH (ref 1.7–7.7)
Neutrophils Relative %: 87 %
Platelets: 487 10*3/uL — ABNORMAL HIGH (ref 150–400)
RBC: 4.3 MIL/uL (ref 4.22–5.81)
RDW: 12.9 % (ref 11.5–15.5)
WBC: 11.6 10*3/uL — ABNORMAL HIGH (ref 4.0–10.5)
nRBC: 0 % (ref 0.0–0.2)

## 2023-05-02 LAB — COMPREHENSIVE METABOLIC PANEL
ALT: 64 U/L — ABNORMAL HIGH (ref 0–44)
AST: 32 U/L (ref 15–41)
Albumin: 2.5 g/dL — ABNORMAL LOW (ref 3.5–5.0)
Alkaline Phosphatase: 43 U/L (ref 38–126)
Anion gap: 6 (ref 5–15)
BUN: 32 mg/dL — ABNORMAL HIGH (ref 8–23)
CO2: 26 mmol/L (ref 22–32)
Calcium: 8.4 mg/dL — ABNORMAL LOW (ref 8.9–10.3)
Chloride: 101 mmol/L (ref 98–111)
Creatinine, Ser: 0.7 mg/dL (ref 0.61–1.24)
GFR, Estimated: >60 mL/min (ref >60)
Glucose, Bld: 128 mg/dL — ABNORMAL HIGH (ref 70–99)
Potassium: 4.7 mmol/L (ref 3.5–5.1)
Sodium: 133 mmol/L — ABNORMAL LOW (ref 135–145)
Total Bilirubin: 0.7 mg/dL (ref <1.2)
Total Protein: 6.2 g/dL — ABNORMAL LOW (ref 6.5–8.1)

## 2023-05-02 LAB — PHOSPHORUS: Phosphorus: 3.4 mg/dL (ref 2.5–4.6)

## 2023-05-02 LAB — MAGNESIUM: Magnesium: 2.4 mg/dL (ref 1.7–2.4)

## 2023-05-02 MED ORDER — MUPIROCIN 2 % EX OINT: 1.0000 | TOPICAL_OINTMENT | Freq: Two times a day (BID) | CUTANEOUS | Status: DC

## 2023-05-02 MED ORDER — LEVALBUTEROL HCL 0.63 MG/3ML IN NEBU
0.6300 mg | INHALATION_SOLUTION | Freq: Two times a day (BID) | RESPIRATORY_TRACT | Status: DC
  Administered 2023-05-02 – 2023-05-05 (×6): 0.63 mg via RESPIRATORY_TRACT
  Filled 2023-05-02 (×6): qty 3

## 2023-05-02 MED ORDER — METHYLPREDNISOLONE SODIUM SUCC 125 MG IJ SOLR: 60.0000 mg | Freq: Every day | INTRAMUSCULAR | Status: DC

## 2023-05-02 MED ORDER — IPRATROPIUM BROMIDE 0.02 % IN SOLN
0.5000 mg | Freq: Two times a day (BID) | RESPIRATORY_TRACT | Status: DC
  Administered 2023-05-02 – 2023-05-03 (×2): 0.5 mg via RESPIRATORY_TRACT
  Filled 2023-05-02 (×2): qty 2.5

## 2023-05-02 NOTE — Progress Notes (Signed)
[facility]                                                                              Devin Castro, is a 72 y.o. male, DOB - [birth year], QVZ:[id number] Admit date - 04/23/2023    Outpatient Primary MD for the patient is Eartha Inch, MD  LOS - 9  days  Chief Complaint  Patient presents with   Shortness of Breath       Brief summary   Patient is a 72 year old male with COPD, prior PE in 2016, completed 6 months of Xarelto, HTN, HLP presented with severe sepsis, shortness of breath for 1 week with acute hypoxic respiratory failure due to bilateral lower lobe pneumonia, COPD exacerbation, RSV infection.    Assessment & Plan    Principal Problem:   Severe sepsis (HCC), bilateral lower lobe pneumonia, POA Bilateral lower lobe pneumonia, RSV pneumonitis/pneumonia -Patient met severe sepsis criteria on admission with leukocytosis, tachycardia, tachypnea, hypoxia, AKI, source likely due to bilateral PNA.  RSV positive -Sepsis physiology improving -Patient has completed course of antibiotics with IV ceftriaxone, Zithromax x 7 days -Leukocytosis improving, lactic acidosis resolved. -Blood cultures negative till date   Active Problems: Acute respiratory failure with hypoxia due to COPD with acute exacerbation (HCC) -Slowly improving, still on O2 9 L via HFNC -Continue IV steroids, 60 g every 12 hours -Continue scheduled Pulmicort, Brovana, DuoNebs -Incentive spirometry, flutter valve, pulmonary hygiene -Out of bed, increase mobility -Wean O2 as tolerated, at baseline not on any O2 per patient      AKI (acute kidney injury) (HCC) -Presented with creatinine of 1.73 on admission, likely due to #1 -Improved, creatinine 0.7    Hypertension -BP stable, continue amlodipine, losartan  Acute transaminitis -Likely due to #1 -ALT trending up, continue to monitor, if continues to rise, obtain right upper quadrant ultrasound -Urine Legionella antigen  negative  Macrocytic anemia -H&H stable -Anemia panel showed ferritin 567, folate 6.1, B12 1366   Acute thrombocytosis -Likely reactive due to #1, improving  Mild hyponatremia -Asymptomatic, may have underlying SIADH due to pneumonia -If trends down, will add workup  Obesity Estimated body mass index is 30.84 kg/m as calculated from the following:   Height as of this encounter: 5\' 10"  (1.778 m).   Weight as of this encounter: 97.5 kg.  Code Status: Full code DVT Prophylaxis:  enoxaparin (LOVENOX) injection 40 mg Start: 04/23/23 2200   Level of Care: Level of care: Progressive Family Communication: Updated patient Disposition Plan:      Remains inpatient appropriate:      Procedures:    Consultants:   PCCM  Antimicrobials:   Anti-infectives (From admission, onward)    Start     Dose/Rate Route Frequency Ordered Stop   04/24/23 0800  cefTRIAXone (ROCEPHIN) 2 g in sodium chloride 0.9 % 100 mL IVPB        2 g 200 mL/hr over 30 Minutes Intravenous Every 24 hours 04/23/23 2136 04/28/23 0912   04/23/23 2015  azithromycin (ZITHROMAX) 500 mg in sodium chloride 0.9 % 250 mL IVPB        500 mg 250 mL/hr over  60 Minutes Intravenous Every 24 hours 04/23/23 2000 04/27/23 2222   04/23/23 2015  cefTRIAXone (ROCEPHIN) 1 g in sodium chloride 0.9 % 100 mL IVPB        1 g 200 mL/hr over 30 Minutes Intravenous  Once 04/23/23 2004 04/23/23 2031   04/23/23 2000  cefTRIAXone (ROCEPHIN) injection 1 g  Status:  Discontinued        1 g Intramuscular  Once 04/23/23 1957 04/23/23 2004          Medications  amLODipine  10 mg Oral QPM   arformoterol  15 mcg Nebulization BID   budesonide (PULMICORT) nebulizer solution  0.5 mg Nebulization BID   Chlorhexidine Gluconate Cloth  6 each Topical Daily   enoxaparin (LOVENOX) injection  40 mg Subcutaneous Q24H   guaiFENesin  1,200 mg Oral BID   ipratropium  0.5 mg Nebulization TID   levalbuterol  0.63 mg Nebulization TID   losartan  100 mg  Oral Daily   melatonin  3 mg Oral QHS   methylPREDNISolone (SOLU-MEDROL) injection  60 mg Intravenous Q12H   mupirocin ointment  1 Application Nasal BID   mouth rinse  15 mL Mouth Rinse 4 times per day   sodium chloride flush  3 mL Intravenous Q12H      Subjective:   Devin Castro was seen and examined today.  Still some shortness of breath, no chest pain, on O2 9 L via HFNC.  Per patient, not on any O2 at baseline.  Patient denies dizziness, abdominal pain, N/V/D/C, new weakness. No fevers.  Objective:   Vitals:   05/02/23 0800 05/02/23 0829 05/02/23 0900 05/02/23 1000  BP: (!) 152/84  134/69 136/72  Pulse: 75     Resp: 17  (!) 24 (!) 21  Temp:  (!) 97.5 F (36.4 C)    TempSrc:  Axillary    SpO2: 95% 96%    Weight:      Height:        Intake/Output Summary (Last 24 hours) at 05/02/2023 1048 Last data filed at 05/02/2023 0200 Gross per 24 hour  Intake 0 ml  Output 1200 ml  Net -1200 ml     Wt Readings from Last 3 Encounters:  04/28/23 97.5 kg  01/18/23 95.8 kg  05/16/19 99.8 kg     Exam General: Alert and oriented x 3, NAD Cardiovascular: S1 S2 auscultated,  RRR Respiratory: Diminished breath sounds bilaterally, no significant wheezing Gastrointestinal: Soft, nontender, nondistended, + bowel sounds Ext: no pedal edema bilaterally Neuro: Strength 5/5 upper and lower extremities bilaterally Skin: No rashes Psych: Normal affect     Data Reviewed:  I have personally reviewed following labs    CBC Lab Results  Component Value Date   WBC 11.6 (H) 05/02/2023   RBC 4.30 05/02/2023   HGB 14.3 05/02/2023   HCT 45.1 05/02/2023   MCV 104.9 (H) 05/02/2023   MCH 33.3 05/02/2023   PLT 487 (H) 05/02/2023   MCHC 31.7 05/02/2023   RDW 12.9 05/02/2023   LYMPHSABS 0.3 (L) 05/02/2023   MONOABS 0.4 05/02/2023   EOSABS 0.0 05/02/2023   BASOSABS 0.1 05/02/2023     Last metabolic panel Lab Results  Component Value Date   NA 133 (L) 05/02/2023   K 4.7 05/02/2023    CL 101 05/02/2023   CO2 26 05/02/2023   BUN 32 (H) 05/02/2023   CREATININE 0.70 05/02/2023   GLUCOSE 128 (H) 05/02/2023   GFRNONAA >60 05/02/2023   GFRAA >60 05/24/2019  CALCIUM 8.4 (L) 05/02/2023   PHOS 3.4 05/02/2023   PROT 6.2 (L) 05/02/2023   ALBUMIN 2.5 (L) 05/02/2023   BILITOT 0.7 05/02/2023   ALKPHOS 43 05/02/2023   AST 32 05/02/2023   ALT 64 (H) 05/02/2023   ANIONGAP 6 05/02/2023    CBG (last 3)  No results for input(s): "GLUCAP" in the last 72 hours.    Coagulation Profile: No results for input(s): "INR", "PROTIME" in the last 168 hours.   Radiology Studies: I have personally reviewed the imaging studies  DG CHEST PORT 1 VIEW Result Date: 05/02/2023 CLINICAL DATA:  Shortness of breath. EXAM: PORTABLE CHEST 1 VIEW COMPARISON:  Chest x-ray from yesterday. FINDINGS: The heart size and mediastinal contours are within normal limits. Normal pulmonary vascularity. Similar low lung volumes with bibasilar opacities. Probable small bilateral pleural effusions are unchanged. No pneumothorax. No acute osseous abnormality. IMPRESSION: 1. Similar low lung volumes with bibasilar atelectasis versus infiltrate and probable small bilateral pleural effusions. Electronically Signed   By: Obie Dredge M.D.   On: 05/02/2023 10:21   DG CHEST PORT 1 VIEW Result Date: 05/01/2023 CLINICAL DATA:  72 year old male with history of shortness of breath. EXAM: PORTABLE CHEST 1 VIEW COMPARISON:  Chest x-ray 04/30/2023. FINDINGS: Lung volumes are low. Mild elevation of the right hemidiaphragm. Small bilateral pleural effusions. Bibasilar opacities which may reflect areas of atelectasis and/or consolidation. No pneumothorax. No evidence of pulmonary edema. Heart size is normal. Upper mediastinal contours are within normal limits. IMPRESSION: 1. Low lung volumes with bibasilar opacities which may reflect areas of atelectasis and/or consolidation and small bilateral pleural effusions. Electronically  Signed   By: Trudie Reed M.D.   On: 05/01/2023 10:23       Damonta Cossey M.D. [facility] 05/02/2023, 10:48 AM  Available via Epic secure chat 7am-7pm After 7 pm, please refer to night coverage provider listed on amion.

## 2023-05-02 NOTE — Progress Notes (Addendum)
   NAME:  Devin Castro, MRN:  [id number], DOB:  [birth year], LOS: 9 ADMISSION DATE:  04/23/2023, CONSULTATION DATE: 04/28/2023 REFERRING MD: Dr. Marland Mcalpine, CHIEF COMPLAINT: Hypoxemic respiratory failure  History of Present Illness:  Asked to see patient for hypoxemic respiratory failure Being treated for RSV pneumonia Worsening oxygen requirement  He has an underlying diagnosis of COPD, was not on any inhalers chronically, was not following up with anyone  COVID in 2020 for which she was hospitalized and went home on oxygen  Admitted with worsening shortness of breath Underlying history of COPD, pulmonary embolism, hypertension Came in with 1 week of cough shortness of breath Hypoxemia when it was initially evaluated  Pertinent  Medical History   Past Medical History:  Diagnosis Date   Arthritis    COPD (chronic obstructive pulmonary disease) (HCC)    Hypertension    Kidney stone    PE (pulmonary embolism)    Shortness of breath dyspnea    Vertigo     Significant Hospital Events: Including procedures, antibiotic start and stop dates in addition to other pertinent events   Chest x-ray 04/28/2023-bibasal infiltrates, worse compared to 12/ 7 12/16 Improvement of respiratory s/s, mobilizing to chair  Interim History / Subjective:  Patient endorses, improvement of respiratory s/s  No use of BIPAP  Objective   Blood pressure 129/72, pulse 81, temperature (!) 97.5 F (36.4 C), temperature source Axillary, resp. rate (!) 26, height 5\' 10"  (1.778 m), weight 97.5 kg, SpO2 91%.        Intake/Output Summary (Last 24 hours) at 05/02/2023 1147 Last data filed at 05/02/2023 0200 Gross per 24 hour  Intake 0 ml  Output 1200 ml  Net -1200 ml   Filed Weights   04/24/23 0350 04/25/23 0500 04/28/23 0516  Weight: 98.1 kg 98.4 kg 97.5 kg   Physical Exam: General: older adult male sitting up in SD bed  HENT: Normocephalic, glasses, Pink MM, HFNC Respiratory: clear/diminished throughout  lung bases, no distress  Cardiovascular: s1, s2, RRR, no MRG Abdomen: BS active, soft  Extremities: no edema, moves all extremities with ease  Neuro: AOx4, follows commands   Resolved Hospital Problem list   N/a   Assessment & Plan:  Acute hypoxemic respiratory failure RSV pneumonia Hx covid 2020 Increased oxygen requirement likely secondary to worsening infiltrate on chest x-ray Markers of inflammation are getting better, procalcitonin is better Overall leukocytosis improving, with infection markers  S/ CAP treatment for total 5 days P: Continue scheduled nebs: brovana, pulmicort, and duonebs  Continue aggressive pulmonary hygiene with deep breathing/coughing  Continue IS/Flutter valve, Mobilize to chair as tolerated  O2 Sat goal 88-92%, wean HFNC as tolerated  Decrease methylprednisolone from 60mg  BID to once BID    Underlying chronic obstructive pulmonary disease was not on any inhaler P: Will need to follow up with pulmonary outpatient/need PFTs Will need to be discharged with bronchodilator inhalers  Continue bronchodilator therapy as above for now    Care Time: 30 mins   Christian Latanga Nedrow AGACNP-BC   [facility] 05/02/2023, 11:57 AM  Please see [email or web address] for pager details.  From 7A-7P if no response, please call [phone]. After hours, please call ELink [phone].

## 2023-05-02 NOTE — Plan of Care (Signed)

## 2023-05-02 NOTE — Plan of Care (Signed)

## 2023-05-03 ENCOUNTER — Telehealth: Payer: Self-pay | Admitting: Acute Care

## 2023-05-03 DIAGNOSIS — J210 Acute bronchiolitis due to respiratory syncytial virus: Secondary | ICD-10-CM | POA: Diagnosis not present

## 2023-05-03 DIAGNOSIS — N179 Acute kidney failure, unspecified: Secondary | ICD-10-CM | POA: Diagnosis not present

## 2023-05-03 DIAGNOSIS — J9601 Acute respiratory failure with hypoxia: Secondary | ICD-10-CM | POA: Diagnosis not present

## 2023-05-03 DIAGNOSIS — A419 Sepsis, unspecified organism: Secondary | ICD-10-CM | POA: Diagnosis not present

## 2023-05-03 DIAGNOSIS — J189 Pneumonia, unspecified organism: Secondary | ICD-10-CM | POA: Diagnosis not present

## 2023-05-03 LAB — COMPREHENSIVE METABOLIC PANEL
ALT: 65 U/L — ABNORMAL HIGH (ref 0–44)
AST: 26 U/L (ref 15–41)
Albumin: 2.7 g/dL — ABNORMAL LOW (ref 3.5–5.0)
Alkaline Phosphatase: 44 U/L (ref 38–126)
Anion gap: 8 (ref 5–15)
BUN: 34 mg/dL — ABNORMAL HIGH (ref 8–23)
CO2: 28 mmol/L (ref 22–32)
Calcium: 8.4 mg/dL — ABNORMAL LOW (ref 8.9–10.3)
Chloride: 99 mmol/L (ref 98–111)
Creatinine, Ser: 0.77 mg/dL (ref 0.61–1.24)
GFR, Estimated: >60 mL/min (ref >60)
Glucose, Bld: 117 mg/dL — ABNORMAL HIGH (ref 70–99)
Potassium: 4 mmol/L (ref 3.5–5.1)
Sodium: 135 mmol/L (ref 135–145)
Total Bilirubin: 0.8 mg/dL (ref <1.2)
Total Protein: 6.3 g/dL — ABNORMAL LOW (ref 6.5–8.1)

## 2023-05-03 MED ORDER — PREDNISONE 5 MG PO TABS: 10.0000 mg | ORAL_TABLET | Freq: Every day | ORAL | Status: DC

## 2023-05-03 MED ORDER — FLUTICASONE FUROATE-VILANTEROL 100-25 MCG/ACT IN AEPB
1.0000 | INHALATION_SPRAY | Freq: Every day | RESPIRATORY_TRACT | Status: DC
  Administered 2023-05-04: 1 via RESPIRATORY_TRACT
  Filled 2023-05-03 (×2): qty 28

## 2023-05-03 MED ORDER — MELATONIN 5 MG PO TABS
5.0000 mg | ORAL_TABLET | Freq: Every day | ORAL | Status: DC
  Administered 2023-05-03 – 2023-05-04 (×2): 5 mg via ORAL
  Filled 2023-05-03 (×2): qty 1

## 2023-05-03 MED ORDER — PREDNISONE 20 MG PO TABS
40.0000 mg | ORAL_TABLET | Freq: Every day | ORAL | Status: DC
  Administered 2023-05-04 – 2023-05-05 (×2): 40 mg via ORAL
  Filled 2023-05-03 (×2): qty 2

## 2023-05-03 MED ORDER — PREDNISONE 20 MG PO TABS: 20.0000 mg | ORAL_TABLET | Freq: Every day | ORAL | Status: DC

## 2023-05-03 MED ORDER — PREDNISONE 5 MG PO TABS: 30.0000 mg | ORAL_TABLET | Freq: Every day | ORAL | Status: DC

## 2023-05-03 NOTE — Progress Notes (Signed)
[facility]                                                                              Joandri Sory, is a 72 y.o. male, DOB - April 12, 1951, WUJ:[id number] Admit date - 04/23/2023    Outpatient Primary MD for the patient is Eartha Inch, MD  LOS - 10  days  Chief Complaint  Patient presents with   Shortness of Breath       Brief summary   Patient is a 72 year old male with COPD, prior PE in 2016, completed 6 months of Xarelto, HTN, HLP presented with severe sepsis, shortness of breath for 1 week with acute hypoxic respiratory failure due to bilateral lower lobe pneumonia, COPD exacerbation, RSV infection.    Assessment & Plan    Principal Problem:   Severe sepsis (HCC), bilateral lower lobe pneumonia, POA Bilateral lower lobe pneumonia, RSV pneumonitis/pneumonia -Patient met severe sepsis criteria on admission with leukocytosis, tachycardia, tachypnea, hypoxia, AKI, source likely due to bilateral PNA.  RSV positive -Sepsis physiology improving -Patient has completed course of antibiotics with IV ceftriaxone, Zithromax x 7 days -Leukocytosis improving, lactic acidosis resolved. -Blood cultures negative till date   Active Problems: Acute respiratory failure with hypoxia due to COPD with acute exacerbation (HCC) -Continue scheduled Pulmicort, Brovana, DuoNebs -Incentive spirometry, flutter valve, pulmonary hygiene -Out of bed, increase mobility -Wean O2 as tolerated, currently on 4 L O2 via HFNC -Improving, transition to oral prednisone from a.m.      AKI (acute kidney injury) (HCC) -Presented with creatinine of 1.73 on admission, likely due to #1 -Improved, creatinine 0.7    Hypertension -BP stable, continue amlodipine, losartan  Acute transaminitis -Likely due to #1 --Urine Legionella antigen negative -Obtain CMET, if ALT still trending up, will need RUQ [place], acute hepatitis panel  Macrocytic anemia -H&H stable -Anemia panel showed ferritin  567, folate 6.1, B12 1366   Acute thrombocytosis -Likely reactive due to #1, improving  Mild hyponatremia -Asymptomatic, may have underlying SIADH due to pneumonia -follow CMET  Obesity Estimated body mass index is 30.84 kg/m as calculated from the following:   Height as of this encounter: 5\' 10"  (1.778 m).   Weight as of this encounter: 97.5 kg.  Code Status: Full code DVT Prophylaxis:  enoxaparin (LOVENOX) injection 40 mg Start: 04/23/23 2200   Level of Care: Level of care: Progressive Family Communication: Updated patient Disposition Plan:      Remains inpatient appropriate:   Transfer to the floor   Procedures:    Consultants:   PCCM  Antimicrobials:   Anti-infectives (From admission, onward)    Start     Dose/Rate Route Frequency Ordered Stop   04/24/23 0800  cefTRIAXone (ROCEPHIN) 2 g in sodium chloride 0.9 % 100 mL IVPB        2 g 200 mL/hr over 30 Minutes Intravenous Every 24 hours 04/23/23 2136 04/28/23 0912   04/23/23 2015  azithromycin (ZITHROMAX) 500 mg in sodium chloride 0.9 % 250 mL IVPB        500 mg 250 mL/hr over 60 Minutes Intravenous Every 24 hours 04/23/23 2000 04/27/23 2222   04/23/23  2015  cefTRIAXone (ROCEPHIN) 1 g in sodium chloride 0.9 % 100 mL IVPB        1 g 200 mL/hr over 30 Minutes Intravenous  Once 04/23/23 2004 04/23/23 2031   04/23/23 2000  cefTRIAXone (ROCEPHIN) injection 1 g  Status:  Discontinued        1 g Intramuscular  Once 04/23/23 1957 04/23/23 2004          Medications  amLODipine  10 mg Oral QPM   Chlorhexidine Gluconate Cloth  6 each Topical Daily   enoxaparin (LOVENOX) injection  40 mg Subcutaneous Q24H   fluticasone furoate-vilanterol  1 puff Inhalation Daily   guaiFENesin  1,200 mg Oral BID   levalbuterol  0.63 mg Nebulization BID   losartan  100 mg Oral Daily   melatonin  3 mg Oral QHS   [START ON 05/04/2023] predniSONE  40 mg Oral Q breakfast   Followed by   Melene Muller ON 05/09/2023] predniSONE  30 mg Oral Q  breakfast   Followed by   Melene Muller ON 05/14/2023] predniSONE  20 mg Oral Q breakfast   Followed by   Melene Muller ON 05/19/2023] predniSONE  10 mg Oral Q breakfast   sodium chloride flush  3 mL Intravenous Q12H      Subjective:   Sukhjit Fluckiger was seen and examined today.  Feeling a lot better today, on 4 L O2 via HFNC.  No chest pain, nausea vomiting, abdominal pain.    Objective:   Vitals:   05/03/23 1000 05/03/23 1100 05/03/23 1200 05/03/23 1300  BP: 134/79     Pulse: 96  79 95  Resp: 19  (!) 22 (!) 23  Temp:  97.6 F (36.4 C)    TempSrc:  Oral    SpO2: 91%  94% 94%  Weight:      Height:        Intake/Output Summary (Last 24 hours) at 05/03/2023 1318 Last data filed at 05/02/2023 2100 Gross per 24 hour  Intake 480 ml  Output 700 ml  Net -220 ml     Wt Readings from Last 3 Encounters:  04/28/23 97.5 kg  01/18/23 95.8 kg  05/16/19 99.8 kg   Physical Exam General: Alert and oriented x 3, NAD Cardiovascular: S1 S2 clear, RRR.  Respiratory: Diminished breath sound at the bases, scattered wheezing Gastrointestinal: Soft, nontender, nondistended, NBS Ext: no pedal edema bilaterally Neuro: no new deficits Psych: Normal affect   Data Reviewed:  I have personally reviewed following labs    CBC Lab Results  Component Value Date   WBC 11.6 (H) 05/02/2023   RBC 4.30 05/02/2023   HGB 14.3 05/02/2023   HCT 45.1 05/02/2023   MCV 104.9 (H) 05/02/2023   MCH 33.3 05/02/2023   PLT 487 (H) 05/02/2023   MCHC 31.7 05/02/2023   RDW 12.9 05/02/2023   LYMPHSABS 0.3 (L) 05/02/2023   MONOABS 0.4 05/02/2023   EOSABS 0.0 05/02/2023   BASOSABS 0.1 05/02/2023     Last metabolic panel Lab Results  Component Value Date   NA 133 (L) 05/02/2023   K 4.7 05/02/2023   CL 101 05/02/2023   CO2 26 05/02/2023   BUN 32 (H) 05/02/2023   CREATININE 0.70 05/02/2023   GLUCOSE 128 (H) 05/02/2023   GFRNONAA >60 05/02/2023   GFRAA >60 05/24/2019   CALCIUM 8.4 (L) 05/02/2023   PHOS 3.4  05/02/2023   PROT 6.2 (L) 05/02/2023   ALBUMIN 2.5 (L) 05/02/2023   BILITOT 0.7 05/02/2023   ALKPHOS  43 05/02/2023   AST 32 05/02/2023   ALT 64 (H) 05/02/2023   ANIONGAP 6 05/02/2023    CBG (last 3)  No results for input(s): "GLUCAP" in the last 72 hours.    Coagulation Profile: No results for input(s): "INR", "PROTIME" in the last 168 hours.   Radiology Studies: I have personally reviewed the imaging studies  DG CHEST PORT 1 VIEW Result Date: 05/02/2023 CLINICAL DATA:  Shortness of breath. EXAM: PORTABLE CHEST 1 VIEW COMPARISON:  Chest x-ray from yesterday. FINDINGS: The heart size and mediastinal contours are within normal limits. Normal pulmonary vascularity. Similar low lung volumes with bibasilar opacities. Probable small bilateral pleural effusions are unchanged. No pneumothorax. No acute osseous abnormality. IMPRESSION: 1. Similar low lung volumes with bibasilar atelectasis versus infiltrate and probable small bilateral pleural effusions. Electronically Signed   By: Obie Dredge M.D.   On: 05/02/2023 10:21       Stevee Valenta M.D. [facility] 05/03/2023, 1:18 PM  Available via Epic secure chat 7am-7pm After 7 pm, please refer to night coverage provider listed on amion.

## 2023-05-03 NOTE — Progress Notes (Addendum)
Physical Therapy Treatment Patient Details Name: Devin Castro MRN: [id number] DOB: 21-Sep-1950 Today's Date: 05/03/2023   History of Present Illness 72 yo male admitted 04/23/23 with Severe sepsis due to bilateral lower lobe pneumonia, leukocytosis, tachycardia, tachypnea, hypoxia and AKI in setting of bilateral lower lobe pneumonia as seen on CXR.  RSV is positive and patient is being treated for potential secondary bacterial pneumonia. PMH: COPD,HTN,PE, Bil. TKA    PT Comments  Pt seen in ICU RM# [id number] General Comments: AxO x 3 pleasant and eager "to get better"  Lives home with Spouse.  Indep/driving.  Retired [employer] architect. Assisted OOB to amb in hallway went well.  Pt tolerated amb full unit >500 feet but required 4 lts to achieve sats >90%.  Avg HR was 126 but no true dyspnea.  RR 28. Pt plans to return home with Spouse and LPT has rec HH PT.  NO equipment.  May need oxygen TBD.    If plan is discharge home, recommend the following: A little help with walking and/or transfers;Assistance with cooking/housework;Assist for transportation;Help with stairs or ramp for entrance   Can travel by private vehicle        Equipment Recommendations  None recommended by PT    Recommendations for Other Services       Precautions / Restrictions Precautions Precautions: Fall;Other (comment) Precaution Comments: monitor O2 Restrictions Weight Bearing Restrictions Per Provider Order: No     Mobility  Bed Mobility Overal bed mobility: Needs Assistance Bed Mobility: Supine to Sit     Supine to sit: Supervision     General bed mobility comments: Pt self able with increased time    Transfers Overall transfer level: Needs assistance Equipment used: None Transfers: Sit to/from Stand Sit to Stand: Supervision           General transfer comment: pt self able with good use of hands to steady self.    Ambulation/Gait Ambulation/Gait assistance: Supervision, Contact guard  assist Gait Distance (Feet): 500 Feet Assistive device: Rolling walker (2 wheels) Gait Pattern/deviations: Step-to pattern, Step-through pattern, Decreased step length - right, Decreased step length - left, Shuffle, Trunk flexed Gait velocity: decreased     General Gait Details: tolerated amb full unit with RW for safety otherwise will NOT need once home.  Pt required 4 lts to achieve sats >90% with activity.  Avg HR was 126.   Stairs             Wheelchair Mobility     Tilt Bed    Modified Rankin (Stroke Patients Only)       Balance                                            Cognition Arousal: Alert Behavior During Therapy: WFL for tasks assessed/performed Overall Cognitive Status: Within Functional Limits for tasks assessed                                 General Comments: AxO x 3 pleasant and eager "to get better"  Lives home with Spouse.  Indep/driving.  Retired [employer] architect.        Exercises      General Comments        Pertinent Vitals/Pain Pain Assessment Pain Assessment: No/denies pain    Home Living  Prior Function            PT Goals (current goals can now be found in the care plan section) Progress towards PT goals: Progressing toward goals    Frequency    Min 1X/week      PT Plan      Co-evaluation              AM-PAC PT "6 Clicks" Mobility   Outcome Measure  Help needed turning from your back to your side while in a flat bed without using bedrails?: None Help needed moving from lying on your back to sitting on the side of a flat bed without using bedrails?: None Help needed moving to and from a bed to a chair (including a wheelchair)?: None Help needed standing up from a chair using your arms (e.g., wheelchair or bedside chair)?: A Little Help needed to walk in hospital room?: A Little Help needed climbing 3-5 steps with a railing? : A Little 6 Click  Score: 21    End of Session Equipment Utilized During Treatment: Gait belt;Oxygen Activity Tolerance: Patient tolerated treatment well;Patient limited by fatigue Patient left: in chair;with call bell/phone within reach Nurse Communication: Mobility status PT Visit Diagnosis: Unsteadiness on feet (R26.81);Difficulty in walking, not elsewhere classified (R26.2)     Time: 8295-6213 PT Time Calculation (min) (ACUTE ONLY): 26 min  Charges:    $Gait Training: 8-22 mins $Therapeutic Activity: 8-22 mins PT General Charges $$ ACUTE PT VISIT: 1 Visit                     Felecia Shelling  PTA Acute  Rehabilitation Services Office M-F          [phone]

## 2023-05-03 NOTE — Progress Notes (Signed)
   05/03/23 2046  BiPAP/CPAP/SIPAP  BiPAP/CPAP/SIPAP Pt Type Adult  Reason BIPAP/CPAP not in use Non-compliant  BiPAP/CPAP /SiPAP Vitals  SpO2 94 %  Bilateral Breath Sounds Diminished

## 2023-05-03 NOTE — Telephone Encounter (Signed)
Patient has been scheduled. Thank you Dr. Everardo All

## 2023-05-03 NOTE — Telephone Encounter (Signed)
OK for blocked 15 min spot

## 2023-05-04 DIAGNOSIS — N179 Acute kidney failure, unspecified: Secondary | ICD-10-CM | POA: Diagnosis not present

## 2023-05-04 DIAGNOSIS — A419 Sepsis, unspecified organism: Secondary | ICD-10-CM | POA: Diagnosis not present

## 2023-05-04 DIAGNOSIS — J210 Acute bronchiolitis due to respiratory syncytial virus: Secondary | ICD-10-CM | POA: Diagnosis not present

## 2023-05-04 DIAGNOSIS — J189 Pneumonia, unspecified organism: Secondary | ICD-10-CM | POA: Diagnosis not present

## 2023-05-04 LAB — COMPREHENSIVE METABOLIC PANEL
ALT: 53 U/L — ABNORMAL HIGH (ref 0–44)
AST: 20 U/L (ref 15–41)
Albumin: 2.5 g/dL — ABNORMAL LOW (ref 3.5–5.0)
Alkaline Phosphatase: 44 U/L (ref 38–126)
Anion gap: 6 (ref 5–15)
BUN: 29 mg/dL — ABNORMAL HIGH (ref 8–23)
CO2: 26 mmol/L (ref 22–32)
Calcium: 8.1 mg/dL — ABNORMAL LOW (ref 8.9–10.3)
Chloride: 101 mmol/L (ref 98–111)
Creatinine, Ser: 0.69 mg/dL (ref 0.61–1.24)
GFR, Estimated: >60 mL/min (ref >60)
Glucose, Bld: 76 mg/dL (ref 70–99)
Potassium: 4.3 mmol/L (ref 3.5–5.1)
Sodium: 133 mmol/L — ABNORMAL LOW (ref 135–145)
Total Bilirubin: 0.7 mg/dL (ref <1.2)
Total Protein: 5.5 g/dL — ABNORMAL LOW (ref 6.5–8.1)

## 2023-05-04 NOTE — Progress Notes (Signed)
Occupational Therapy Treatment and Discharge Patient Details Name: Devin Castro MRN: [id number] DOB: 27-Feb-1951 Today's Date: 05/04/2023   History of present illness 72 yo male admitted 04/23/23 with Severe sepsis due to bilateral lower lobe pneumonia, leukocytosis, tachycardia, tachypnea, hypoxia and AKI in setting of bilateral lower lobe pneumonia as seen on CXR.  RSV is positive and patient is being treated for potential secondary bacterial pneumonia. PMH: COPD,HTN,PE, Bil. TKA   OT comments  This 72 yo male admitted with above presents to acute OT with all education completed and pt at a Mod I level (no AD but increased time) for basic ADLs and has A from family prn once home. No further acute OT or follow up OT needs post D/C. We will sign off.         Equipment Recommendations  None recommended by OT       Precautions / Restrictions Precautions Precautions: Fall;Other (comment) Precaution Comments: monitor O2 Restrictions Weight Bearing Restrictions Per Provider Order: No       Mobility Bed Mobility               General bed mobility comments: pt up in recliner upon my arrival    Transfers Overall transfer level: Independent Equipment used: None                     Balance Overall balance assessment: Independent                                         ADL either performed or assessed with clinical judgement   ADL Overall ADL's : Modified independent                                       General ADL Comments: We did discuss energy conservation strategies from hand out given to him in prior session with me pointing out that if he sits to do tasks that it will take less energy and thus he will not get as short of breath, to not plan too much in one day, to take his time, to use purse lipped breathing in increments of 5 reps if he does get short of breath and also he needs to continue to do inspirometer (he can get to  1500) 10 reps every hour ( or 5 reps every 1/2 hour he is awake) and flutter valve 5 times every hour he is awake.    Extremity/Trunk Assessment Upper Extremity Assessment Upper Extremity Assessment: Overall WFL for tasks assessed            Vision Baseline Vision/History: 1 Wears glasses Ability to See in Adequate Light: 0 Adequate Patient Visual Report: No change from baseline            Cognition Arousal: Alert   Overall Cognitive Status: Within Functional Limits for tasks assessed                                                General Comments sats at rest on RA 90% and up to 93% after 5 reps of purse lipped breathing. Up and about in room then check sats again on RA with sats  90%.    Pertinent Vitals/ Pain       Pain Assessment Pain Assessment: No/denies pain            Progress Toward Goals  OT Goals(current goals can now be found in the care plan section)  Progress towards OT goals: Goals met/education completed, patient discharged from OT  Acute Rehab OT Goals Patient Stated Goal: to continue improving OT Goal Formulation: With patient Time For Goal Achievement: 05/12/23 Potential to Achieve Goals: Good         AM-PAC OT "6 Clicks" Daily Activity     Outcome Measure   Help from another person eating meals?: None Help from another person taking care of personal grooming?: None Help from another person toileting, which includes using toliet, bedpan, or urinal?: None Help from another person bathing (including washing, rinsing, drying)?: None Help from another person to put on and taking off regular upper body clothing?: None Help from another person to put on and taking off regular lower body clothing?: None 6 Click Score: 24    End of Session Equipment Utilized During Treatment: Gait belt (RA this afternoon)  OT Visit Diagnosis: Muscle weakness (generalized) (M62.81)   Activity Tolerance Patient tolerated treatment well    Patient Left in chair;with call bell/phone within reach           Time: 1415-1437 OT Time Calculation (min): 22 min  Charges: OT General Charges $OT Visit: 1 Visit OT Treatments $Self Care/Home Management : 8-22 mins  Lindon Romp OT Acute Rehabilitation Services Office [phone]    Evette Georges 05/04/2023, 3:19 PM

## 2023-05-04 NOTE — Progress Notes (Signed)
[facility]                                                                              Riese Bogart, is a 72 y.o. male, DOB - 08-03-1950, FAO:[id number] Admit date - 04/23/2023    Outpatient Primary MD for the patient is Eartha Inch, MD  LOS - 11  days  Chief Complaint  Patient presents with   Shortness of Breath       Brief summary   Patient is a 72 year old male with COPD, prior PE in 2016, completed 6 months of Xarelto, HTN, HLP presented with severe sepsis, shortness of breath for 1 week with acute hypoxic respiratory failure due to bilateral lower lobe pneumonia, COPD exacerbation, RSV infection.    Assessment & Plan    Principal Problem:   Severe sepsis (HCC), bilateral lower lobe pneumonia, POA Bilateral lower lobe pneumonia, RSV pneumonitis/pneumonia -Patient met severe sepsis criteria on admission with leukocytosis, tachycardia, tachypnea, hypoxia, AKI, source likely due to bilateral PNA.  RSV positive -Sepsis physiology improving -Patient has completed course of antibiotics with IV ceftriaxone, Zithromax x 7 days -Leukocytosis improving, lactic acidosis resolved.  Transitioned to oral prednisone -Blood cultures negative till date -O2 sat improving, 94% on 2 L weaned down from 4 L today -Home O2 evaluation, PT OT evaluation today   Active Problems: Acute respiratory failure with hypoxia due to COPD with acute exacerbation (HCC) -Continue scheduled Pulmicort, Brovana, DuoNebs -Incentive spirometry, flutter valve, pulmonary hygiene -Out of bed, increase mobility -Improving, transition to oral prednisone, wean O2 as tolerated -PT OT, home O2 evaluation      AKI (acute kidney injury) (HCC) -Presented with creatinine of 1.73 on admission, likely due to #1 -Improved, creatinine 0.7    Hypertension -BP stable, continue amlodipine, losartan  Acute transaminitis -Likely due to #1 --Urine Legionella antigen negative -LFTs  improving  Macrocytic anemia -H&H stable -Anemia panel showed ferritin 567, folate 6.1, B12 1366   Acute thrombocytosis -Likely reactive due to #1, improving  Mild hyponatremia -Asymptomatic, may have underlying SIADH due to pneumonia -follow CMET  Obesity Estimated body mass index is 30.84 kg/m as calculated from the following:   Height as of this encounter: 5\' 10"  (1.778 m).   Weight as of this encounter: 97.5 kg.  Code Status: Full code DVT Prophylaxis:  enoxaparin (LOVENOX) injection 40 mg Start: 04/23/23 2200   Level of Care: Level of care: Progressive Family Communication: Updated patient Disposition Plan:      Remains inpatient appropriate:   Home O2, PT OT evaluation today, patient wants to go home tomorrow if continues to improve   Procedures:    Consultants:   PCCM  Antimicrobials:   Anti-infectives (From admission, onward)    Start     Dose/Rate Route Frequency Ordered Stop   04/24/23 0800  cefTRIAXone (ROCEPHIN) 2 g in sodium chloride 0.9 % 100 mL IVPB        2 g 200 mL/hr over 30 Minutes Intravenous Every 24 hours 04/23/23 2136 04/28/23 0912   04/23/23 2015  azithromycin (ZITHROMAX) 500 mg in sodium chloride 0.9 % 250 mL IVPB  500 mg 250 mL/hr over 60 Minutes Intravenous Every 24 hours 04/23/23 2000 04/27/23 2222   04/23/23 2015  cefTRIAXone (ROCEPHIN) 1 g in sodium chloride 0.9 % 100 mL IVPB        1 g 200 mL/hr over 30 Minutes Intravenous  Once 04/23/23 2004 04/23/23 2031   04/23/23 2000  cefTRIAXone (ROCEPHIN) injection 1 g  Status:  Discontinued        1 g Intramuscular  Once 04/23/23 1957 04/23/23 2004          Medications  amLODipine  10 mg Oral QPM   Chlorhexidine Gluconate Cloth  6 each Topical Daily   enoxaparin (LOVENOX) injection  40 mg Subcutaneous Q24H   fluticasone furoate-vilanterol  1 puff Inhalation Daily   guaiFENesin  1,200 mg Oral BID   levalbuterol  0.63 mg Nebulization BID   losartan  100 mg Oral Daily    melatonin  5 mg Oral QHS   predniSONE  40 mg Oral Q breakfast   Followed by   Melene Muller ON 05/09/2023] predniSONE  30 mg Oral Q breakfast   Followed by   Melene Muller ON 05/14/2023] predniSONE  20 mg Oral Q breakfast   Followed by   Melene Muller ON 05/19/2023] predniSONE  10 mg Oral Q breakfast   sodium chloride flush  3 mL Intravenous Q12H      Subjective:   Drayven Grill was seen and examined today.  Doing much better today, this morning at the time of my encounter on 4 L O2 via [place].  No acute shortness of breath, chest pain or wheezing.  Overall feeling better.    Objective:   Vitals:   05/04/23 0431 05/04/23 0900 05/04/23 0905 05/04/23 0909  BP: 116/75     Pulse: 85     Resp:      Temp: 98.3 F (36.8 C)     TempSrc: Oral     SpO2: 95% 96% 95% 94%  Weight:      Height:        Intake/Output Summary (Last 24 hours) at 05/04/2023 0916 Last data filed at 05/03/2023 2034 Gross per 24 hour  Intake 120 ml  Output 180 ml  Net -60 ml     Wt Readings from Last 3 Encounters:  04/28/23 97.5 kg  01/18/23 95.8 kg  05/16/19 99.8 kg   Physical Exam General: Alert and oriented x 3, NAD Cardiovascular: S1 S2 clear, RRR.  Respiratory: Diminished breath sound at the bases Gastrointestinal: Soft, nontender, nondistended, NBS Ext: no pedal edema bilaterally Neuro: no new deficits Psych: Normal affect    Data Reviewed:  I have personally reviewed following labs    CBC Lab Results  Component Value Date   WBC 11.6 (H) 05/02/2023   RBC 4.30 05/02/2023   HGB 14.3 05/02/2023   HCT 45.1 05/02/2023   MCV 104.9 (H) 05/02/2023   MCH 33.3 05/02/2023   PLT 487 (H) 05/02/2023   MCHC 31.7 05/02/2023   RDW 12.9 05/02/2023   LYMPHSABS 0.3 (L) 05/02/2023   MONOABS 0.4 05/02/2023   EOSABS 0.0 05/02/2023   BASOSABS 0.1 05/02/2023     Last metabolic panel Lab Results  Component Value Date   NA 133 (L) 05/04/2023   K 4.3 05/04/2023   CL 101 05/04/2023   CO2 26 05/04/2023   BUN 29 (H)  05/04/2023   CREATININE 0.69 05/04/2023   GLUCOSE 76 05/04/2023   GFRNONAA >60 05/04/2023   GFRAA >60 05/24/2019   CALCIUM 8.1 (L) 05/04/2023  PHOS 3.4 05/02/2023   PROT 5.5 (L) 05/04/2023   ALBUMIN 2.5 (L) 05/04/2023   BILITOT 0.7 05/04/2023   ALKPHOS 44 05/04/2023   AST 20 05/04/2023   ALT 53 (H) 05/04/2023   ANIONGAP 6 05/04/2023    CBG (last 3)  No results for input(s): "GLUCAP" in the last 72 hours.    Coagulation Profile: No results for input(s): "INR", "PROTIME" in the last 168 hours.   Radiology Studies: I have personally reviewed the imaging studies  No results found.      Thad Ranger M.D. [facility] 05/04/2023, 9:16 AM  Available via Epic secure chat 7am-7pm After 7 pm, please refer to night coverage provider listed on amion.

## 2023-05-04 NOTE — TOC Transition Note (Signed)
Transition of Care [facility]) - Discharge Note   Patient Details  Name: Devin Castro MRN: [id number] Date of Birth: 13-Dec-1950  Transition [facility] Care [facility]) CM/SW Contact:  Lanier Clam, RN Phone Number: 05/04/2023, 12:51 PM   Clinical Narrative: Spoke to patient about d/c plans-he declines HHC-he will set up once home if he needs with his PCP. No further CM needs.      Final next level of care: Home/Self Care Barriers to Discharge: No Barriers Identified   Patient Goals and CMS Choice Patient states their goals for this hospitalization and ongoing recovery are:: Home          Discharge Placement                       Discharge Plan and Services Additional resources added to the After Visit Summary for                                       Social Drivers of Health (SDOH) Interventions SDOH Screenings   Food Insecurity: No Food Insecurity (04/24/2023)  Housing: Low Risk  (04/28/2023)  Transportation Needs: No Transportation Needs (04/24/2023)  Utilities: Not At Risk (04/24/2023)  Financial Resource Strain: Low Risk  (11/22/2021)   [facility] prior to [facility]17[facility]  Physical Activity: Sufficiently Active (11/22/2021)   [facility] prior to 07/17/2022., [facility]  Social Connections: Moderately Integrated (11/22/2021)   Received from [facility] visits prior to 07/17/2022., [facility]  Stress: No Stress Concern Present (11/22/2021)   [facility] prior to [facility]/17/2022., [facility]  Tobacco Use: Medium Risk (04/24/2023)     Readmission Risk Interventions     No data to display

## 2023-05-04 NOTE — Progress Notes (Signed)
BIPAP PRN not using at this time.

## 2023-05-04 NOTE — Progress Notes (Signed)
Mobility Specialist - Progress Note   05/04/23 1301  Oxygen Therapy  SpO2 (!) 88 %  O2 Device Room Air  Patient Activity (if Appropriate) Ambulating  Mobility  Activity Ambulated independently in hallway  Level of Assistance Independent  Assistive Device None  Distance Ambulated (ft) 500 ft  Activity Response Tolerated well  Mobility Referral Yes  Mobility visit 1 Mobility  Mobility Specialist Start Time (ACUTE ONLY) 1248  Mobility Specialist Stop Time (ACUTE ONLY) 1259  Mobility Specialist Time Calculation (min) (ACUTE ONLY) 11 min   Nurse requested Mobility Specialist to perform oxygen saturation test with pt which includes removing pt from oxygen both at rest and while ambulating.  Below are the results from that testing.     Patient Saturations on Room Air at Rest = spO2 91%  Patient Saturations on Room Air while Ambulating = sp02 88% .  Patient Saturations on 0.5 Liters of oxygen while Ambulating = sp02 94%  At end of testing pt left in room on 1  Liters of oxygen.  Reported results to nurse.  Pt received in recliner and agreeable to mobility. No complaints during session. Pt to recliner after session with all needs met.     Pre-mobility: 121 HR, 91% SpO2 (RA) During mobility: 115 HR, 88-91%SpO2 (RA/0.5L) Post-mobility: 118 HR, 94% SPO2 (1L [place])  Chief Technology Officer

## 2023-05-05 DIAGNOSIS — J189 Pneumonia, unspecified organism: Secondary | ICD-10-CM | POA: Diagnosis not present

## 2023-05-05 DIAGNOSIS — J210 Acute bronchiolitis due to respiratory syncytial virus: Secondary | ICD-10-CM | POA: Diagnosis not present

## 2023-05-05 DIAGNOSIS — N179 Acute kidney failure, unspecified: Secondary | ICD-10-CM | POA: Diagnosis not present

## 2023-05-05 DIAGNOSIS — A419 Sepsis, unspecified organism: Secondary | ICD-10-CM | POA: Diagnosis not present

## 2023-05-05 MED ORDER — PREDNISONE 10 MG PO TABS: ORAL_TABLET | ORAL | 0 refills | Status: AC

## 2023-05-05 MED ORDER — ONDANSETRON 4 MG PO TBDP: 4.0000 mg | ORAL_TABLET | Freq: Three times a day (TID) | ORAL | 0 refills | Status: AC | PRN

## 2023-05-05 MED ORDER — FLUTICASONE FUROATE-VILANTEROL 100-25 MCG/ACT IN AEPB: 1.0000 | INHALATION_SPRAY | Freq: Every day | RESPIRATORY_TRACT | 4 refills | Status: AC

## 2023-05-05 MED ORDER — ALBUTEROL SULFATE HFA 108 (90 BASE) MCG/ACT IN AERS
2.0000 | INHALATION_SPRAY | Freq: Four times a day (QID) | RESPIRATORY_TRACT | 2 refills | Status: AC | PRN
Start: 2023-05-05 — End: ?

## 2023-05-05 MED ORDER — BENZONATATE 100 MG PO CAPS: 100.0000 mg | ORAL_CAPSULE | Freq: Three times a day (TID) | ORAL | 0 refills | Status: AC | PRN

## 2023-05-05 MED ORDER — GUAIFENESIN ER 600 MG PO TB12: 1200.0000 mg | ORAL_TABLET | Freq: Two times a day (BID) | ORAL | 0 refills | Status: AC | PRN

## 2023-05-05 NOTE — Discharge Planning (Signed)
Pt ambulated in hall. 02 saturation 93-96 while on room air.

## 2023-05-05 NOTE — TOC Transition Note (Signed)
Transition of Care [facility]) - Discharge Note   Patient Details  Name: Devin Castro MRN: [id number] Date of Birth: 1951/02/11  [facility]) CM/SW Contact:  Lanier Clam, RN Phone Number: 05/05/2023, 9:48 AM   Clinical Narrative:  Patient declined HHPT. No further CM needs.     Final next level of care: Home/Self Care Barriers to Discharge: No Barriers Identified   Patient Goals and CMS Choice Patient states their goals for this hospitalization and ongoing recovery are:: Home          Discharge Placement                       Discharge Plan and Services Additional resources added to the After Visit Summary for                            [facility] Arranged: Patient Refused HH          Social Drivers of Health (SDOH) Interventions SDOH Screenings   Food Insecurity: No Food Insecurity (04/24/2023)  Housing: Low Risk  (04/28/2023)  Transportation Needs: No Transportation Needs (04/24/2023)  Utilities: Not At Risk (04/24/2023)  Financial Resource Strain: Low Risk  (11/22/2021)   [facility] prior to 07/17/2022., [facility]  Physical Activity: Sufficiently Active (11/22/2021)   [facility] prior to 07/17/2022., [facility]  Social Connections: Moderately Integrated (11/22/2021)   Received from [facility] visits prior to 07/17/2022., [facility]  Stress: No Stress Concern Present (11/22/2021)   [facility] visits prior to 07/17/2022., [facility]  Tobacco Use: Medium Risk (04/24/2023)     Readmission Risk Interventions     No data to display

## 2023-05-05 NOTE — Discharge Summary (Signed)
Physician Discharge Summary   Patient: Devin Castro MRN: [id number] DOB: 07/28/1950  Admit date:     04/23/2023  Discharge date: 05/05/23  Discharge Physician: Thad Ranger   PCP: Eartha Inch, MD   Recommendations at discharge:   Started on Breo Ellipta 100-25 1 puff daily Continue prednisone taper 40 mg daily for 5 days, then 30 mg daily for 5 days, 20 mg daily for 5 days, then 10 mg daily for 5 days then off Outpatient follow-up with pulmonology scheduled with Dr. Everardo All, on 06/01/2023  Discharge Diagnoses:    Severe sepsis (HCC), POA   COPD with acute exacerbation (HCC)   Community acquired bilateral lower lobe pneumonia   Acute respiratory failure with hypoxia (HCC)   AKI (acute kidney injury) (HCC)   Hypertension   RSV (acute bronchiolitis due to respiratory syncytial virus)    Hospital Course: Patient is a 72 year old male with COPD, prior PE in 2016, completed 6 months of Xarelto, HTN, HLP presented with severe sepsis, shortness of breath for 1 week with acute hypoxic respiratory failure due to bilateral lower lobe pneumonia, COPD exacerbation, RSV infection.   Assessment and Plan:   Severe sepsis (HCC), bilateral lower lobe pneumonia, POA Bilateral lower lobe pneumonia, RSV pneumonitis/pneumonia -Patient met severe sepsis criteria on admission with leukocytosis, tachycardia, tachypnea, hypoxia, AKI, source likely due to bilateral PNA.  RSV positive -Sepsis physiology improved -Patient has completed course of antibiotics with IV ceftriaxone, Zithromax x 7 days -Leukocytosis improving, lactic acidosis resolved.  Transitioned to oral prednisone, continue with taper -Blood cultures negative till date -O2 sats improving, now weaned off to room air, sats this morning 95% -Ambulated in the hallway, O2 sats remained 93 to 96% on room air    Acute respiratory failure with hypoxia due to COPD with acute exacerbation (HCC) -Patient was placed on scheduled Pulmicort,  Brovana, DuoNebs -Incentive spirometry, flutter valve, pulmonary hygiene -He did very well and was subsequently weaned off of O2 -On the day of discharge, ambulating in the hallway with O2 sats 93 to 96% on room air       AKI (acute kidney injury) (HCC) -Presented with creatinine of 1.73 on admission, likely due to #1 -Improved, creatinine 0.6 at discharge     Hypertension -BP stable, continue amlodipine, losartan   Acute transaminitis -Likely due to #1 --Urine Legionella antigen negative -LFTs improving   Macrocytic anemia -H&H stable -Anemia panel showed ferritin 567, folate 6.1, B12 1366     Acute thrombocytosis -Likely reactive due to #1, improving   Mild hyponatremia -Asymptomatic, may have underlying SIADH due to pneumonia Sodium 133 at discharge  Obesity Estimated body mass index is 30.84 kg/m as calculated from the following:   Height as of this encounter: 5\' 10"  (1.778 m).   Weight as of this encounter: 97.5 kg.      Pain control - [employer] Controlled Substance Reporting System database was reviewed. and patient was instructed, not to drive, operate heavy machinery, perform activities at heights, swimming or participation in water activities or provide baby-sitting services while on Pain, Sleep and Anxiety Medications; until their outpatient Physician has advised to do so again. Also recommended to not to take more than prescribed Pain, Sleep and Anxiety Medications.  Consultants: Pulmonology Procedures performed: None Disposition: Home Diet recommendation:  Discharge Diet Orders (From admission, onward)     Start     Ordered   05/05/23 0000  Diet - low sodium heart healthy  05/05/23 0936            DISCHARGE MEDICATION: Allergies as of 05/05/2023   No Known Allergies      Medication List     STOP taking these medications    Ipratropium-Albuterol 20-100 MCG/ACT Aers respimat Commonly known as: COMBIVENT       TAKE these  medications    albuterol 108 (90 Base) MCG/ACT inhaler Commonly known as: VENTOLIN HFA Inhale 2 puffs into the lungs every 6 (six) hours as needed for wheezing or shortness of breath.   Alka-Seltzer Plus Cold 325-2-7.8 MG Tbef Generic drug: Chlorphen-Phenyleph-ASA Take 1 tablet by mouth every 6 (six) hours as needed (for cold or flu-like symtoms/dissolve in water and drink).   amLODipine 10 MG tablet Commonly known as: NORVASC Take 10 mg by mouth every evening. Notes to patient: Last given 12/19 @0900 . Next dose due 12/20@0900    benzonatate 100 MG capsule Commonly known as: TESSALON Take 1 capsule (100 mg total) by mouth 3 (three) times daily as needed for cough.   fluticasone furoate-vilanterol 100-25 MCG/ACT Aepb Commonly known as: BREO ELLIPTA Inhale 1 puff into the lungs daily. Start taking on: May 06, 2023 Notes to patient: Last give 12/19@0900 . Due 12/20 in am   guaiFENesin 600 MG 12 hr tablet Commonly known as: MUCINEX Take 2 tablets (1,200 mg total) by mouth 2 (two) times daily as needed for to loosen phlegm or cough. Notes to patient: Last given 12/19@0900 .  Next dose due 12/19@2100    losartan 100 MG tablet Commonly known as: COZAAR Take 100 mg by mouth every evening. Notes to patient: Last given 12/19@0900  Next dose due 12/20@0900    ondansetron 4 MG disintegrating tablet Commonly known as: Zofran ODT Take 1 tablet (4 mg total) by mouth every 8 (eight) hours as needed for up to 20 doses for nausea or vomiting. Notes to patient: Not given inhospital   predniSONE 10 MG tablet Commonly known as: DELTASONE Take 4 tablets (40 mg total) by mouth daily with breakfast for 5 days, THEN 3 tablets (30 mg total) daily with breakfast for 5 days, THEN 2 tablets (20 mg total) daily with breakfast for 5 days, THEN 1 tablet (10 mg total) daily with breakfast for 5 days. Start taking on: May 06, 2023 Notes to patient: Last dose 12/19@0900  Next dose due 12/20 @ 0900         Follow-up Information     Luciano Cutter, MD Follow up on 06/01/2023.   Specialty: Pulmonary Disease Why: At 4 PM, for hospital follow-up Contact information: [address] [place] [address] [phone]         Eartha Inch, MD. Schedule an appointment as soon as possible for a visit in 2 week(s).   Specialty: Family Medicine Why: for hospital follow-up Contact information: [address] [place] [address] [phone]                Discharge Exam: Ceasar Mons Weights   04/24/23 0350 04/25/23 0500 04/28/23 0516  Weight: 98.1 kg 98.4 kg 97.5 kg   S: Doing well, sitting up in the chair, on room air.  No chest pain, shortness of breath, fevers.  Looking forward to go home today.  BP 108/75 (BP Location: Right Arm)   Pulse 89   Temp 98.2 F (36.8 C) (Oral)   Resp 17   Ht 5\' 10"  (1.778 m)   Wt 97.5 kg   SpO2 95%   BMI 30.84 kg/m  Physical Exam General: Alert and oriented x 3, NAD Cardiovascular: S1 S2 clear, RRR.  Respiratory: CTAB, no wheezing, rales or rhonchi Gastrointestinal: Soft, nontender, nondistended, NBS Ext: no pedal edema bilaterally Neuro: no new deficits Skin: No rashes Psych: Normal affect and demeanor, alert and oriented x3    Condition at discharge: fair  The results of significant diagnostics from this hospitalization (including imaging, microbiology, ancillary and laboratory) are listed below for reference.   Imaging Studies: DG CHEST PORT 1 VIEW Result Date: 05/02/2023 CLINICAL DATA:  Shortness of breath. EXAM: PORTABLE CHEST 1 VIEW COMPARISON:  Chest x-ray from yesterday. FINDINGS: The heart size and mediastinal contours are within normal limits. Normal pulmonary vascularity. Similar low lung volumes with bibasilar opacities. Probable small bilateral pleural effusions are unchanged. No pneumothorax. No acute osseous abnormality. IMPRESSION: 1. Similar low lung volumes with bibasilar  atelectasis versus infiltrate and probable small bilateral pleural effusions. Electronically Signed   By: Obie Dredge M.D.   On: 05/02/2023 10:21   DG CHEST PORT 1 VIEW Result Date: 05/01/2023 CLINICAL DATA:  72 year old male with history of shortness of breath. EXAM: PORTABLE CHEST 1 VIEW COMPARISON:  Chest x-ray 04/30/2023. FINDINGS: Lung volumes are low. Mild elevation of the right hemidiaphragm. Small bilateral pleural effusions. Bibasilar opacities which may reflect areas of atelectasis and/or consolidation. No pneumothorax. No evidence of pulmonary edema. Heart size is normal. Upper mediastinal contours are within normal limits. IMPRESSION: 1. Low lung volumes with bibasilar opacities which may reflect areas of atelectasis and/or consolidation and small bilateral pleural effusions. Electronically Signed   By: Trudie Reed M.D.   On: 05/01/2023 10:23   DG CHEST PORT 1 VIEW Result Date: 04/30/2023 CLINICAL DATA:  141880 SOB (shortness of breath) [id number] EXAM: PORTABLE CHEST 1 VIEW COMPARISON:  Chest radiograph from one day prior. FINDINGS: Stable cardiomediastinal silhouette with normal heart size. No pneumothorax. No pleural effusion. Very low lung volumes, similar. No pulmonary edema. Hazy bibasilar lung opacities, slightly improved on the right and similar on the left. IMPRESSION: Very low lung volumes. Hazy bibasilar lung opacities, slightly improved on the right and similar on the left, favor atelectasis. Electronically Signed   By: Delbert Phenix M.D.   On: 04/30/2023 09:50   DG CHEST PORT 1 VIEW Result Date: 04/29/2023 CLINICAL DATA:  Shortness of breath. EXAM: PORTABLE CHEST 1 VIEW COMPARISON:  April 28, 2023. FINDINGS: Stable cardiomediastinal silhouette. Stable bibasilar opacities are noted concerning for atelectasis, infiltrates or edema. Bony thorax is unremarkable. IMPRESSION: Stable bibasilar opacities as noted above. Electronically Signed   By: Lupita Raider M.D.   On:  04/29/2023 08:21   DG CHEST PORT 1 VIEW Result Date: 04/28/2023 CLINICAL DATA:  [id number], shortness of breath. EXAM: PORTABLE CHEST 1 VIEW COMPARISON:  Portable chest 04/23/2023 FINDINGS: 4:27 a.m.  Expiratory exam with decreased lung excursion. There is increased opacity in the hypoinflated lower lung fields which could be atelectasis, pneumonia or combination. Small area of new increased opacity laterally in the right upper lobe concerning for a new infiltrate. The remaining upper lung fields are clear. The cardiomediastinal silhouette and vasculature are normal for expiration. IMPRESSION: 1. Expiratory exam with decreased lung excursion. 2. Increased opacity in the hypoinflated lower lung fields which could be atelectasis, pneumonia or combination. 3. Small area of new increased opacity laterally in the right upper lobe concerning for a new infiltrate. Electronically Signed   By: Almira Bar M.D.   On: 04/28/2023 06:28   DG Chest [facility] 1 View Result  Date: 04/23/2023 CLINICAL DATA:  Shortness of breath EXAM: PORTABLE CHEST 1 VIEW COMPARISON:  05/22/2019 FINDINGS: Heart and mediastinal contours are within normal limits. Patchy opacities in lower lungs bilaterally. No effusions or pneumothorax. No acute bony abnormality. IMPRESSION: Patchy bilateral lower lobe airspace opacities concerning for pneumonia. Electronically Signed   By: Charlett Nose M.D.   On: 04/23/2023 20:40    Microbiology: Results for orders placed or performed during the hospital encounter of 04/23/23  Resp panel by RT-PCR (RSV, Flu A&B, Covid) Anterior Nasal Swab     Status: Abnormal   Collection Time: 04/23/23  7:47 PM   Specimen: Anterior Nasal Swab  Result Value Ref Range Status   SARS Coronavirus 2 by RT PCR NEGATIVE NEGATIVE Final    Comment: (NOTE) SARS-CoV-2 target nucleic acids are NOT DETECTED.  The SARS-CoV-2 RNA is generally detectable in upper respiratory specimens during the acute phase of infection. The  lowest concentration of SARS-CoV-2 viral copies this assay can detect is 138 copies/mL. A negative result does not preclude SARS-Cov-2 infection and should not be used as the sole basis for treatment or other patient management decisions. A negative result may occur with  improper specimen collection/handling, submission of specimen other than nasopharyngeal swab, presence of viral mutation(s) within the areas targeted by this assay, and inadequate number of viral copies(<138 copies/mL). A negative result must be combined with clinical observations, patient history, and epidemiological information. The expected result is Negative.  Fact Sheet for Patients:  BloggerCourse.com  Fact Sheet for Healthcare Providers:  SeriousBroker.it  This test is no t yet approved or cleared by the [place] FDA and  has been authorized for detection and/or diagnosis of SARS-CoV-2 by FDA under an Emergency Use Authorization (EUA). This EUA will remain  in effect (meaning this test can be used) for the duration of the COVID-19 declaration under Section 564(b)(1) of the Act, 21 U.S.C.section 360bbb-3(b)(1), unless the authorization is terminated  or revoked sooner.       Influenza A by PCR NEGATIVE NEGATIVE Final   Influenza B by PCR NEGATIVE NEGATIVE Final    Comment: (NOTE) The Xpert Xpress SARS-CoV-2/FLU/RSV plus assay is intended as an aid in the diagnosis of influenza from Nasopharyngeal swab specimens and should not be used as a sole basis for treatment. Nasal washings and aspirates are unacceptable for Xpert Xpress SARS-CoV-2/FLU/RSV testing.  Fact Sheet for Patients: BloggerCourse.com  Fact Sheet for Healthcare Providers: SeriousBroker.it  This test is not yet approved or cleared by the [place] FDA and has been authorized for detection and/or diagnosis of SARS-CoV-2 by FDA under  an Emergency Use Authorization (EUA). This EUA will remain in effect (meaning this test can be used) for the duration of the COVID-19 declaration under Section 564(b)(1) of the Act, 21 U.S.C. section 360bbb-3(b)(1), unless the authorization is terminated or revoked.     Resp Syncytial Virus by PCR POSITIVE (A) NEGATIVE Final    Comment: (NOTE) Fact Sheet for Patients: BloggerCourse.com  Fact Sheet for Healthcare Providers: SeriousBroker.it  This test is not yet approved or cleared by the [place] FDA and has been authorized for detection and/or diagnosis of SARS-CoV-2 by FDA under an Emergency Use Authorization (EUA). This EUA will remain in effect (meaning this test can be used) for the duration of the COVID-19 declaration under Section 564(b)(1) of the Act, 21 U.S.C. section 360bbb-3(b)(1), unless the authorization is terminated or revoked.  Performed at [facility], [address], [place], [place] [address]  Blood culture (routine x 2)     Status: None   Collection Time: 04/23/23  7:58 PM   Specimen: BLOOD  Result Value Ref Range Status   Specimen Description   Final    BLOOD LEFT ANTECUBITAL Performed at [facility], [address], [place], [place] [address]    Special Requests   Final    BOTTLES DRAWN AEROBIC AND ANAEROBIC Blood Culture adequate volume Performed at [facility], [address], [place], [place] [address]    Culture   Final    NO GROWTH 5 DAYS Performed at [facility], [address], [place], [place] [address]    Report Status 04/28/2023 FINAL  Final  Blood culture (routine x 2)     Status: None   Collection Time: 04/23/23  8:10 PM   Specimen: BLOOD LEFT FOREARM  Result Value Ref Range Status   Specimen Description   Final    BLOOD LEFT FOREARM Performed at [facility], [address], [place], [place] [address]     Special Requests   Final    BOTTLES DRAWN AEROBIC AND ANAEROBIC Blood Culture results may not be optimal due to an inadequate volume of blood received in culture bottles Performed at [facility], [address] [place], [place] [address]    Culture   Final    NO GROWTH 5 DAYS Performed at [facility], [address] [place], [place] [address]    Report Status 04/28/2023 FINAL  Final    Labs: CBC: Recent Labs  Lab 04/29/23 0252 04/30/23 0311 05/01/23 0311 05/02/23 0301  WBC 16.6* 14.3* 12.6* 11.6*  NEUTROABS 14.8* 12.7* 11.3* 10.1*  HGB 14.3 14.0 14.1 14.3  HCT 44.4 41.7 43.9 45.1  MCV 103.3* 102.2* 103.5* 104.9*  PLT 521* 522* 513* 487*   Basic Metabolic Panel: Recent Labs  Lab 04/29/23 0252 04/30/23 0311 05/01/23 0311 05/02/23 0301 05/03/23 1353 05/04/23 0518  NA 131* 131* 135 133* 135 133*  K 4.7 4.8 4.9 4.7 4.0 4.3  CL 99 100 103 101 99 101  CO2 24 24 25 26 28 26   GLUCOSE 129* 133* 133* 128* 117* 76  BUN 22 26* 31* 32* 34* 29*  CREATININE 0.51* 0.68 0.71 0.70 0.77 0.69  CALCIUM 8.4* 8.3* 8.5* 8.4* 8.4* 8.1*  MG 2.2 2.2 2.3 2.4  --   --   PHOS 3.9 4.1 4.4 3.4  --   --    Liver Function Tests: Recent Labs  Lab 04/30/23 0311 05/01/23 0311 05/02/23 0301 05/03/23 1353 05/04/23 0518  AST 21 29 32 26 20  ALT 38 49* 64* 65* 53*  ALKPHOS 42 41 43 44 44  BILITOT 0.5 0.7 0.7 0.8 0.7  PROT 6.1* 6.0* 6.2* 6.3* 5.5*  ALBUMIN 2.5* 2.5* 2.5* 2.7* 2.5*   CBG: No results for input(s): "GLUCAP" in the last 168 hours.  Discharge time spent: greater than 30 minutes.  Signed: Thad Ranger, MD [facility] 05/05/2023

## 2023-06-01 ENCOUNTER — Inpatient Hospital Stay (HOSPITAL_BASED_OUTPATIENT_CLINIC_OR_DEPARTMENT_OTHER): Payer: PPO | Admitting: Pulmonary Disease
